# Patient Record
Sex: Male | Born: 1980 | Hispanic: Yes | Smoking: Former smoker
Health system: Southern US, Community
[De-identification: ages and names within clinical notes are randomized; demographics above are authoritative.]

## PROBLEM LIST (undated history)

## (undated) DIAGNOSIS — J45909 Unspecified asthma, uncomplicated: Secondary | ICD-10-CM

## (undated) DIAGNOSIS — T7840XA Allergy, unspecified, initial encounter: Secondary | ICD-10-CM

## (undated) DIAGNOSIS — F419 Anxiety disorder, unspecified: Secondary | ICD-10-CM

## (undated) DIAGNOSIS — F32A Depression, unspecified: Secondary | ICD-10-CM

## (undated) DIAGNOSIS — E119 Type 2 diabetes mellitus without complications: Secondary | ICD-10-CM

## (undated) HISTORY — PX: EYE SURGERY: SHX253

## (undated) HISTORY — DX: Anxiety disorder, unspecified: F41.9

## (undated) HISTORY — PX: OTHER SURGICAL HISTORY: SHX169

## (undated) HISTORY — DX: Depression, unspecified: F32.A

## (undated) HISTORY — DX: Allergy, unspecified, initial encounter: T78.40XA

---

## 2006-09-09 ENCOUNTER — Emergency Department: Payer: Self-pay | Admitting: Emergency Medicine

## 2007-06-05 ENCOUNTER — Emergency Department: Payer: Self-pay | Admitting: Internal Medicine

## 2008-01-08 ENCOUNTER — Emergency Department: Payer: Self-pay | Admitting: Emergency Medicine

## 2009-03-21 ENCOUNTER — Emergency Department: Payer: Self-pay | Admitting: Emergency Medicine

## 2012-08-12 ENCOUNTER — Emergency Department (HOSPITAL_COMMUNITY)
Admission: EM | Admit: 2012-08-12 | Discharge: 2012-08-12 | Disposition: A | Discharge status: Home / Self Care | Payer: BC Managed Care – PPO | Source: Home / Self Care

## 2012-08-12 ENCOUNTER — Encounter (HOSPITAL_COMMUNITY): Payer: Self-pay | Admitting: Emergency Medicine

## 2012-08-12 DIAGNOSIS — J309 Allergic rhinitis, unspecified: Secondary | ICD-10-CM

## 2012-08-12 DIAGNOSIS — IMO0001 Reserved for inherently not codable concepts without codable children: Secondary | ICD-10-CM

## 2012-08-12 HISTORY — DX: Unspecified asthma, uncomplicated: J45.909

## 2012-08-12 MED ORDER — TRIAMCINOLONE ACETONIDE 40 MG/ML IJ SUSP
40.0000 mg | Freq: Once | INTRAMUSCULAR | Status: AC
  Administered 2012-08-12: 40 mg via INTRAMUSCULAR

## 2012-08-12 MED ORDER — FLUTICASONE PROPIONATE 50 MCG/ACT NA SUSP: 2.0000 | Freq: Every day | NASAL | Status: DC

## 2012-08-12 MED ORDER — METHYLPREDNISOLONE 4 MG PO KIT: PACK | ORAL | Status: DC

## 2012-08-12 MED ORDER — BECLOMETHASONE DIPROPIONATE 80 MCG/ACT IN AERS: INHALATION_SPRAY | RESPIRATORY_TRACT | Status: DC

## 2012-08-12 MED ORDER — TRIAMCINOLONE ACETONIDE 40 MG/ML IJ SUSP
INTRAMUSCULAR | Status: AC
  Filled 2012-08-12: qty 5

## 2012-08-12 NOTE — ED Notes (Signed)
Reports allergy/asthma symptoms for 2 weeks. No improvement and asthma is worsening, increase usage of inhaler.  Patient planning a trip out of the country this Saturday.

## 2012-08-12 NOTE — ED Provider Notes (Signed)
History     CSN: [id number]  Arrival date & time 08/12/12  1054   First MD Initiated Contact with Patient 08/12/12 1146      Chief Complaint  Patient presents with  . Asthma    (Consider location/radiation/quality/duration/timing/severity/associated sxs/prior treatment) HPI Comments: 32 year old male with a history of allergies and asthma presents with complaints of worsening of allergy symptoms and increased use of the inhaler. He is now using his albuterol inhaler approximately 3 times a day. Call or symptoms include PND, cough, sneeze, itchy and red eyes. He is taking Claritin without significant relief.   Past Medical History  Diagnosis Date  . Asthma     History reviewed. No pertinent past surgical history.  No family history on file.  History  Substance Use Topics  . Smoking status: Former Games developer  . Smokeless tobacco: Not on file  . Alcohol Use: Yes      Review of Systems  Constitutional: Negative.   HENT: Positive for rhinorrhea, sneezing and postnasal drip. Negative for hearing loss, ear pain, nosebleeds, congestion, sore throat, facial swelling, mouth sores and trouble swallowing.   Respiratory: Positive for cough, shortness of breath and wheezing.   Cardiovascular: Negative.   Gastrointestinal: Negative.   Musculoskeletal: Negative.   Skin: Negative.     Allergies  Review of patient's allergies indicates no known allergies.  Home Medications   Current Outpatient Rx  Name  Route  Sig  Dispense  Refill  . ALBUTEROL IN   Inhalation   Inhale into the lungs.         . Loratadine (CLARITIN PO)   Oral   Take by mouth.         . beclomethasone (QVAR) 80 MCG/ACT inhaler      One inhalation bid   1 Inhaler   1   . fluticasone (FLONASE) 50 MCG/ACT nasal spray   Nasal   Place 2 sprays into the nose daily.   1 g   1   . methylPREDNISolone (MEDROL DOSEPAK) 4 MG tablet      follow package directions   21 tablet   0     BP 137/92  Pulse  96  Temp(Src) 98 F (36.7 C) (Oral)  Resp 16  SpO2 100%  Physical Exam  Nursing note and vitals reviewed. Constitutional: He is oriented to person, place, and time. He appears well-developed and well-nourished. No distress.  HENT:  Bilateral TMs are normal Oropharynx with minor erythema and clear PND. No exudates.  Eyes: EOM are normal. Left eye exhibits no discharge.  Neck: Normal range of motion. Neck supple.  Cardiovascular: Normal rate, regular rhythm and normal heart sounds.   Pulmonary/Chest: Effort normal and breath sounds normal. No respiratory distress.  Rare wheeze with inspiration. No expiratory wheezes. Good air movement and chest expansion. No cough during the exam.  Musculoskeletal: Normal range of motion. He exhibits no edema.  Lymphadenopathy:    He has no cervical adenopathy.  Neurological: He is alert and oriented to person, place, and time.  Skin: Skin is warm and dry. No rash noted. Pallor: keller.  Psychiatric: He has a normal mood and affect.    ED Course  Procedures (including critical care time)  Labs Reviewed - No data to display No results found.   1. Allergic rhinitis due to allergen   2. Asthma exacerbation, allergic       MDM  May try 60 mg 3 times a day when necessary allergies or if insufficient may try  Chlor-Trimeton 4 mg 1/2-1 tablet every 4 hours when necessary, may cause drowsiness. Fluticasone nasal spray as directed And Qvar 80 mg one inhalation twice a day Medrol Dosepak Kenalog 40 mg IM Recommend calling the number given to obtain a local primary care physician. May return at any symptoms problems or worsening. He denies current shortness of breath or significant wheezing. The patient nor I believe that a nebulizer is necessary at this time.        Hayden Rasmussen, NP 08/12/12 1226  Hayden Rasmussen, NP 08/12/12 1227

## 2012-08-12 NOTE — ED Provider Notes (Signed)
Medical screening examination/treatment/procedure(s) were performed by non-physician practitioner and as supervising physician I was immediately available for consultation/collaboration.  Leslee Home, M.D.  Reuben Likes, MD 08/12/12 [phone]

## 2014-02-07 ENCOUNTER — Ambulatory Visit (INDEPENDENT_AMBULATORY_CARE_PROVIDER_SITE_OTHER): Payer: BC Managed Care – PPO | Admitting: Physician Assistant

## 2014-02-07 VITALS — BP 122/76 | HR 72 | Temp 98.7°F | Resp 20 | Ht 71.0 in | Wt 229.0 lb

## 2014-02-07 DIAGNOSIS — R070 Pain in throat: Secondary | ICD-10-CM

## 2014-02-07 DIAGNOSIS — R202 Paresthesia of skin: Secondary | ICD-10-CM

## 2014-02-07 DIAGNOSIS — Z833 Family history of diabetes mellitus: Secondary | ICD-10-CM

## 2014-02-07 DIAGNOSIS — R200 Anesthesia of skin: Secondary | ICD-10-CM

## 2014-02-07 DIAGNOSIS — J4520 Mild intermittent asthma, uncomplicated: Secondary | ICD-10-CM

## 2014-02-07 LAB — BASIC METABOLIC PANEL
BUN: 12 mg/dL (ref 6–23)
CALCIUM: 10 mg/dL (ref 8.4–10.5)
CO2: 27 mEq/L (ref 19–32)
CREATININE: 0.89 mg/dL (ref 0.50–1.35)
Chloride: 102 mEq/L (ref 96–112)
GLUCOSE: 100 mg/dL — AB (ref 70–99)
Potassium: 4.8 mEq/L (ref 3.5–5.3)
SODIUM: 137 meq/L (ref 135–145)

## 2014-02-07 LAB — GLUCOSE, POCT (MANUAL RESULT ENTRY): POC GLUCOSE: 92 mg/dL (ref 70–99)

## 2014-02-07 LAB — POCT RAPID STREP A (OFFICE): Rapid Strep A Screen: NEGATIVE

## 2014-02-07 MED ORDER — ALBUTEROL SULFATE HFA 108 (90 BASE) MCG/ACT IN AERS: 2.0000 | INHALATION_SPRAY | RESPIRATORY_TRACT | Status: DC | PRN
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
## 2014-02-07 NOTE — Progress Notes (Signed)
I was directly involved with the patient's care and agree with the physical, diagnosis and treatment plan.  

## 2014-02-09 LAB — CULTURE, GROUP A STREP: ORGANISM ID, BACTERIA: NORMAL

## 2014-02-20 NOTE — Progress Notes (Signed)
Tried to call patient to schedule an appointment but number was not in service at this time.

## 2014-02-24 ENCOUNTER — Telehealth: Payer: Self-pay | Admitting: Family Medicine

## 2014-02-24 NOTE — Telephone Encounter (Signed)
Mobile number out of service and home number does not have voice mail set up. Unable to leave message

## 2014-02-24 NOTE — Telephone Encounter (Signed)
-----   Message from Wallis BambergMario Mani, [place]-C sent at 02/07/2014 11:15 AM EDT ----- Please schedule Physical Exam at the Appointment Center

## 2014-03-06 ENCOUNTER — Telehealth: Payer: Self-pay

## 2014-03-06 NOTE — Telephone Encounter (Signed)
Attempted to contact patient to schedule appointment.  Patient unable to be reached- home number cannot receive messages and cell phone number is not in service. (3rd attempt)

## 2014-06-10 ENCOUNTER — Ambulatory Visit (INDEPENDENT_AMBULATORY_CARE_PROVIDER_SITE_OTHER): Payer: Self-pay | Admitting: Urgent Care

## 2014-06-10 VITALS — BP 144/88 | HR 82 | Temp 97.6°F | Resp 16 | Ht 70.0 in | Wt 228.0 lb

## 2014-06-10 DIAGNOSIS — R110 Nausea: Secondary | ICD-10-CM

## 2014-06-10 DIAGNOSIS — R42 Dizziness and giddiness: Secondary | ICD-10-CM

## 2014-06-10 DIAGNOSIS — K0889 Other specified disorders of teeth and supporting structures: Secondary | ICD-10-CM

## 2014-06-10 DIAGNOSIS — K088 Other specified disorders of teeth and supporting structures: Secondary | ICD-10-CM

## 2014-06-10 MED ORDER — LIDOCAINE VISCOUS 2 % MT SOLN: 20.0000 mL | OROMUCOSAL | Status: DC | PRN

## 2014-06-10 NOTE — Patient Instructions (Addendum)
For dental pain, please use viscous lidocaine every 6 hours. You may continue using Vicofen (Hydrocodone with ibuprofen) every 6 hours for your pain. Please make sure you make your appointment with your oral surgeon as this will be the definitive treatment for your tooth pain.

## 2014-06-10 NOTE — Progress Notes (Signed)
    MRN: [id number] DOB: 1980-10-11  Subjective:   Alexander GermanRafael Miro Jr. is a 34 y.o. male presenting for chief complaint of Drug Overdose  Reports that he was evaluated by his dentist this week for right sided tooth pain, dx with molar decay and advised surgical removal. Patient was started on Hydrocodone-APAP and also received Hydrocodone-IBU. He started taking Hydrocodone-APAP x3, Tylenol 500mg  x3 on Thursday. Patient reports minimal relief with this. Friday, he took Hydrocodone-IBU x3, ibuprofen 200mg  x8 throughout the day, with some relief. However, now patient reports dizziness, nausea, stomach upset, generally feels "different" and is worried that he overdosed on medications. He states that he has not taken anything for pain today, has some discomfort but not as bad as earlier this week. He denies hypoventilation, pinpoint pupils, confusion, shob, rhinorrhea, lacrimation, chills, depressed mood, suicidal ideation. Denies any other aggravating or relieving factors, no other questions or concerns.  Alexander Osborne has a current medication list which includes the following prescription(s): albuterol, albuterol, fluticasone, hydrocodone-ibuprofen, loratadine, beclomethasone, and hydrocodone-acetaminophen.  He has No Known Allergies.  Alexander Osborne  has a past medical history of Asthma and Allergy. Also  has past surgical history that includes Eye surgery.  ROS As in subjective.  Objective:   Vitals: BP 144/88 mmHg  Pulse 82  Temp(Src) 97.6 F (36.4 C) (Oral)  Resp 16  Ht 5\' 10"  (1.778 m)  Wt 228 lb (103.42 kg)  BMI 32.71 kg/m2  SpO2 100%  Physical Exam  Constitutional: He is oriented to person, place, and time and well-developed, well-nourished, and in no distress.  HENT:  TM's intact bilaterally, no effusions or erythema. Nares patent, minimal rhinorrhea. Gums non-erythematous, no edema, bleeding or drainage. Right lower molars without obvious signs of infection. Mucous membranes moist.  Eyes:  Conjunctivae are normal. Right eye exhibits no discharge. Left eye exhibits no discharge. No scleral icterus.  No pinpoint pupils, no lacrimation.  Cardiovascular: Normal rate, regular rhythm, normal heart sounds and intact distal pulses.  Exam reveals no gallop and no friction rub.   No murmur heard. Pulmonary/Chest: Effort normal and breath sounds normal. No respiratory distress. He has no wheezes. He has no rales. He exhibits no tenderness.  Abdominal: Soft. Bowel sounds are normal. He exhibits no distension and no mass. There is no tenderness.  Neurological: He is alert and oriented to person, place, and time.  Skin: Skin is warm and dry. No rash noted. No erythema.  Psychiatric: Mood normal.  Patient is giddy throughout HPI.   Assessment and Plan :   1. Pain, dental 2. Dizziness and giddiness 3. Nausea without vomiting - Reassured patient, advised that he stop use Hydrocodone-IBU since this helped most, emphasized that patient should not use more than prescribed. - For local pain relief, patient may use viscous lidocaine - Advised patient to make his appointment with oral surgeon which is scheduled for Monday, 06/12/2014   Wallis BambergMario Candice Lunney, PA-C [facility] [phone] 06/10/2014 2:35 PM

## 2015-04-17 ENCOUNTER — Ambulatory Visit (INDEPENDENT_AMBULATORY_CARE_PROVIDER_SITE_OTHER): Payer: Self-pay | Admitting: Emergency Medicine

## 2015-04-17 VITALS — BP 130/80 | HR 109 | Temp 98.8°F | Resp 20 | Wt 243.0 lb

## 2015-04-17 DIAGNOSIS — J209 Acute bronchitis, unspecified: Secondary | ICD-10-CM

## 2015-04-17 DIAGNOSIS — J0140 Acute pansinusitis, unspecified: Secondary | ICD-10-CM

## 2015-04-17 MED ORDER — AMOXICILLIN-POT CLAVULANATE 875-125 MG PO TABS: 1.0000 | ORAL_TABLET | Freq: Two times a day (BID) | ORAL | Status: DC

## 2015-04-17 MED ORDER — PSEUDOEPHEDRINE-GUAIFENESIN ER 60-600 MG PO TB12: 1.0000 | ORAL_TABLET | Freq: Two times a day (BID) | ORAL | Status: DC

## 2015-04-17 MED ORDER — HYDROCOD POLST-CPM POLST ER 10-8 MG/5ML PO SUER: 5.0000 mL | Freq: Two times a day (BID) | ORAL | Status: DC

## 2015-04-17 NOTE — Progress Notes (Signed)
Subjective:  Patient ID: Alexander Osborne, male    DOB: December 19, 1980  Age: 34 y.o. MRN: [id number]  CC: Cough; Headache; Fever; Sore Throat; and Wheezing   HPI Alexander Osborne presents   Patient is been experiencing fever and chills.  He has been ill since before Christmas. He has nasal congestion postnasal drainage of purulent nasal discharge. He has a sore throat. Has a cough productive of purulent sputum. He has no shortness of breath. He does wheeze at night. He has no nausea vomiting or stool change. He has had no improvement with over-the-counter medication been using his inhaler no improvement  History Alexander Osborne has a past medical history of Asthma and Allergy.   He has past surgical history that includes Eye surgery.   His  family history includes Cancer in his mother; Hypertension in his father.  He   reports that he has quit smoking. He does not have any smokeless tobacco history on file. He reports that he drinks alcohol. He reports that he does not use illicit drugs.  Outpatient Prescriptions Prior to Visit  Medication Sig Dispense Refill  . albuterol (PROVENTIL HFA;VENTOLIN HFA) 108 (90 BASE) MCG/ACT inhaler Inhale 2 puffs into the lungs every 4 (four) hours as needed for wheezing or shortness of breath (cough, shortness of breath or wheezing.). 1 Inhaler 1  . beclomethasone (QVAR) 80 MCG/ACT inhaler One inhalation bid 1 Inhaler 1  . Loratadine (CLARITIN PO) Take by mouth.    . ALBUTEROL IN Inhale into the lungs. Reported on 04/17/2015    . fluticasone (FLONASE) 50 MCG/ACT nasal spray Place 2 sprays into the nose daily. (Patient not taking: Reported on 04/17/2015) 1 g 1  . lidocaine (XYLOCAINE) 2 % solution Use as directed 20 mLs in the mouth or throat as needed for mouth pain. (Patient not taking: Reported on 04/17/2015) 100 mL 0   No facility-administered medications prior to visit.    Social History   Social History  . Marital Status: Single    Spouse Name:  N/A  . Number of Children: N/A  . Years of Education: N/A   Social History Main Topics  . Smoking status: Former Games developermoker  . Smokeless tobacco: None  . Alcohol Use: Yes  . Drug Use: No  . Sexual Activity: Not Asked   Other Topics Concern  . None   Social History Narrative     Review of Systems  Constitutional: Positive for fever, chills and fatigue. Negative for appetite change.  HENT: Positive for congestion, postnasal drip, rhinorrhea and sinus pressure. Negative for ear pain and sore throat.   Eyes: Negative for pain and redness.  Respiratory: Positive for cough and wheezing. Negative for shortness of breath.   Cardiovascular: Negative for leg swelling.  Gastrointestinal: Negative for nausea, vomiting, abdominal pain, diarrhea, constipation and blood in stool.  Endocrine: Negative for polyuria.  Genitourinary: Negative for dysuria, urgency, frequency and flank pain.  Musculoskeletal: Negative for gait problem.  Skin: Negative for rash.  Neurological: Negative for weakness and headaches.  Psychiatric/Behavioral: Negative for confusion and decreased concentration. The patient is not nervous/anxious.     Objective:  BP 130/80 mmHg  Pulse 109  Temp(Src) 98.8 F (37.1 C) (Oral)  Resp 20  Wt 243 lb (110.224 kg)  SpO2 97%  Physical Exam  Constitutional: He is oriented to person, place, and time. He appears well-developed and well-nourished.  HENT:  Head: Normocephalic and atraumatic.  Right Ear: External ear normal.  Left Ear: External ear normal.  Mouth/Throat: Oropharynx is clear and moist. No oropharyngeal exudate.  Eyes: Conjunctivae are normal. Pupils are equal, round, and reactive to light.  Neck: Normal range of motion.  Pulmonary/Chest: Effort normal. No respiratory distress. He has no wheezes. He has no rales.  Musculoskeletal: He exhibits no edema.  Neurological: He is alert and oriented to person, place, and time.  Skin: Skin is dry.  Psychiatric: He has a  normal mood and affect. His behavior is normal. Thought content normal.      Assessment & Plan:   Alexander Osborne was seen today for cough, headache, fever, sore throat and wheezing.  Diagnoses and all orders for this visit:  Acute bronchitis, unspecified organism  Acute pansinusitis, recurrence not specified  Other orders -     amoxicillin-clavulanate (AUGMENTIN) 875-125 MG tablet; Take 1 tablet by mouth 2 (two) times daily. -     chlorpheniramine-HYDROcodone (TUSSIONEX PENNKINETIC ER) 10-8 MG/5ML SUER; Take 5 mLs by mouth 2 (two) times daily. -     pseudoephedrine-guaifenesin (MUCINEX D) 60-600 MG 12 hr tablet; Take 1 tablet by mouth every 12 (twelve) hours.  I am having Mr. [facility] start on amoxicillin-clavulanate, chlorpheniramine-HYDROcodone, and pseudoephedrine-guaifenesin. I am also having him maintain his Loratadine (CLARITIN PO), ALBUTEROL IN, fluticasone, beclomethasone, albuterol, and lidocaine.  Meds ordered this encounter  Medications  . amoxicillin-clavulanate (AUGMENTIN) 875-125 MG tablet    Sig: Take 1 tablet by mouth 2 (two) times daily.    Dispense:  20 tablet    Refill:  0  . chlorpheniramine-HYDROcodone (TUSSIONEX PENNKINETIC ER) 10-8 MG/5ML SUER    Sig: Take 5 mLs by mouth 2 (two) times daily.    Dispense:  60 mL    Refill:  0  . pseudoephedrine-guaifenesin (MUCINEX D) 60-600 MG 12 hr tablet    Sig: Take 1 tablet by mouth every 12 (twelve) hours.    Dispense:  18 tablet    Refill:  0    Appropriate red flag conditions were discussed with the patient as well as actions that should be taken.  Patient expressed his understanding.  Follow-up: Return if symptoms worsen or fail to improve.  Carmelina Dane, MD

## 2015-04-17 NOTE — Patient Instructions (Signed)

## 2015-05-06 ENCOUNTER — Ambulatory Visit (INDEPENDENT_AMBULATORY_CARE_PROVIDER_SITE_OTHER): Payer: Managed Care, Other (non HMO) | Admitting: Emergency Medicine

## 2015-05-06 VITALS — BP 126/80 | HR 92 | Temp 98.0°F | Resp 20 | Ht 70.0 in | Wt 241.4 lb

## 2015-05-06 DIAGNOSIS — J4520 Mild intermittent asthma, uncomplicated: Secondary | ICD-10-CM | POA: Diagnosis not present

## 2015-05-06 DIAGNOSIS — J209 Acute bronchitis, unspecified: Secondary | ICD-10-CM

## 2015-05-06 MED ORDER — ALBUTEROL SULFATE HFA 108 (90 BASE) MCG/ACT IN AERS: 2.0000 | INHALATION_SPRAY | RESPIRATORY_TRACT | Status: DC | PRN

## 2015-05-06 MED ORDER — AZITHROMYCIN 250 MG PO TABS: ORAL_TABLET | ORAL | Status: DC

## 2015-05-06 MED ORDER — HYDROCOD POLST-CPM POLST ER 10-8 MG/5ML PO SUER: 5.0000 mL | Freq: Two times a day (BID) | ORAL | Status: DC

## 2015-05-06 NOTE — Patient Instructions (Signed)

## 2015-05-06 NOTE — Progress Notes (Signed)
Subjective:  Patient ID: Alexander Osborne, male    DOB: 1980-04-25  Age: 35 y.o. MRN: [id number]  CC: Cough and Emesis   HPI Alexander Osborne presents  patient was treated in December for bronchitis and sinusitis he transiently improved and now has developed another cough. He has no fever chills nausea vomiting. He has a persistent cough with no wheezing or shortness of breath. He's been unable to control her symptoms with over-the-counter medication. He coughs to the point of vomiting at times. Has no nasal congestion postnasal drainage. No improvement with over-the-counter medication.  [place] has a past medical history of Asthma and Allergy.   He has past surgical history that includes Eye surgery.   His  family history includes Cancer in his mother; Hypertension in his father.  He   reports that he has quit smoking. He does not have any smokeless tobacco history on file. He reports that he drinks alcohol. He reports that he does not use illicit drugs.  Outpatient Prescriptions Prior to Visit  Medication Sig Dispense Refill  . ALBUTEROL IN Inhale into the lungs. Reported on 04/17/2015    . Loratadine (CLARITIN PO) Take by mouth.    . pseudoephedrine-guaifenesin (MUCINEX D) 60-600 MG 12 hr tablet Take 1 tablet by mouth every 12 (twelve) hours. 18 tablet 0  . albuterol (PROVENTIL HFA;VENTOLIN HFA) 108 (90 BASE) MCG/ACT inhaler Inhale 2 puffs into the lungs every 4 (four) hours as needed for wheezing or shortness of breath (cough, shortness of breath or wheezing.). 1 Inhaler 1  . amoxicillin-clavulanate (AUGMENTIN) 875-125 MG tablet Take 1 tablet by mouth 2 (two) times daily. (Patient not taking: Reported on 05/06/2015) 20 tablet 0  . beclomethasone (QVAR) 80 MCG/ACT inhaler One inhalation bid (Patient not taking: Reported on 05/06/2015) 1 Inhaler 1  . chlorpheniramine-HYDROcodone (TUSSIONEX PENNKINETIC ER) 10-8 MG/5ML SUER Take 5 mLs by mouth 2 (two) times daily. (Patient  not taking: Reported on 05/06/2015) 60 mL 0  . fluticasone (FLONASE) 50 MCG/ACT nasal spray Place 2 sprays into the nose daily. (Patient not taking: Reported on 04/17/2015) 1 g 1  . lidocaine (XYLOCAINE) 2 % solution Use as directed 20 mLs in the mouth or throat as needed for mouth pain. (Patient not taking: Reported on 04/17/2015) 100 mL 0   No facility-administered medications prior to visit.    Social History   Social History  . Marital Status: Single    Spouse Name: N/A  . Number of Children: N/A  . Years of Education: N/A   Social History Main Topics  . Smoking status: Former Games developermoker  . Smokeless tobacco: None  . Alcohol Use: Yes  . Drug Use: No  . Sexual Activity: Not Asked   Other Topics Concern  . None   Social History Narrative     Review of Systems  Constitutional: Negative for fever, chills and appetite change.  HENT: Negative for congestion, ear pain, postnasal drip, sinus pressure and sore throat.   Eyes: Negative for pain and redness.  Respiratory: Positive for cough. Negative for shortness of breath and wheezing.   Cardiovascular: Negative for leg swelling.  Gastrointestinal: Positive for vomiting. Negative for nausea, abdominal pain, diarrhea, constipation and blood in stool.  Endocrine: Negative for polyuria.  Genitourinary: Negative for dysuria, urgency, frequency and flank pain.  Musculoskeletal: Negative for gait problem.  Skin: Negative for rash.  Neurological: Negative for weakness and headaches.  Psychiatric/Behavioral: Negative for confusion and decreased concentration. The patient is not nervous/anxious.  Objective:  BP 126/80 mmHg  Pulse 92  Temp(Src) 98 F (36.7 C) (Oral)  Resp 20  Ht 5\' 10"  (1.778 m)  Wt 241 lb 6.4 oz (109.498 kg)  BMI 34.64 kg/m2  SpO2 98%  Physical Exam  Constitutional: He is oriented to person, place, and time. He appears well-developed and well-nourished.  HENT:  Head: Normocephalic and atraumatic.  Eyes:  Conjunctivae are normal. Pupils are equal, round, and reactive to light.  Pulmonary/Chest: Effort normal.  Musculoskeletal: He exhibits no edema.  Neurological: He is alert and oriented to person, place, and time.  Skin: Skin is dry.  Psychiatric: He has a normal mood and affect. His behavior is normal. Thought content normal.      Assessment & Plan:   Branon was seen today for cough and emesis.  Diagnoses and all orders for this visit:  Acute bronchitis, unspecified organism  Extrinsic asthma, mild intermittent, uncomplicated -     albuterol (PROVENTIL HFA;VENTOLIN HFA) 108 (90 Base) MCG/ACT inhaler; Inhale 2 puffs into the lungs every 4 (four) hours as needed for wheezing or shortness of breath (cough, shortness of breath or wheezing.).  Other orders -     azithromycin (ZITHROMAX) 250 MG tablet; Take 2 tabs PO x 1 dose, then 1 tab PO QD x 4 days -     chlorpheniramine-HYDROcodone (TUSSIONEX PENNKINETIC ER) 10-8 MG/5ML SUER; Take 5 mLs by mouth 2 (two) times daily.   I am having Mr. Doree Fudge start on azithromycin and chlorpheniramine-HYDROcodone. I am also having him maintain his Loratadine (CLARITIN PO), ALBUTEROL IN, fluticasone, beclomethasone, lidocaine, amoxicillin-clavulanate, chlorpheniramine-HYDROcodone, pseudoephedrine-guaifenesin, and albuterol.  Meds ordered this encounter  Medications  . azithromycin (ZITHROMAX) 250 MG tablet    Sig: Take 2 tabs PO x 1 dose, then 1 tab PO QD x 4 days    Dispense:  6 tablet    Refill:  0  . chlorpheniramine-HYDROcodone (TUSSIONEX PENNKINETIC ER) 10-8 MG/5ML SUER    Sig: Take 5 mLs by mouth 2 (two) times daily.    Dispense:  60 mL    Refill:  0  . albuterol (PROVENTIL HFA;VENTOLIN HFA) 108 (90 Base) MCG/ACT inhaler    Sig: Inhale 2 puffs into the lungs every 4 (four) hours as needed for wheezing or shortness of breath (cough, shortness of breath or wheezing.).    Dispense:  1 Inhaler    Refill:  12    Appropriate red flag  conditions were discussed with the patient as well as actions that should be taken.  Patient expressed his understanding.  Follow-up: Return if symptoms worsen or fail to improve.  Carmelina Dane, MD

## 2015-06-05 ENCOUNTER — Ambulatory Visit (INDEPENDENT_AMBULATORY_CARE_PROVIDER_SITE_OTHER): Payer: Managed Care, Other (non HMO) | Admitting: Family Medicine

## 2015-06-05 ENCOUNTER — Ambulatory Visit (INDEPENDENT_AMBULATORY_CARE_PROVIDER_SITE_OTHER): Payer: Managed Care, Other (non HMO)

## 2015-06-05 VITALS — BP 124/86 | HR 95 | Temp 98.6°F | Resp 16 | Ht 70.0 in | Wt 241.0 lb

## 2015-06-05 DIAGNOSIS — R1032 Left lower quadrant pain: Secondary | ICD-10-CM

## 2015-06-05 NOTE — Patient Instructions (Addendum)
We are running a series of tests to investigate whether the bowel problem is inflammatory, infectious, or functional (irritable bowel syndrome).  I think it's important to reestablish the normal biome of your bowel (getting normal bacteria again). To that and you should take Align daily.   Because you received an x-ray today, you will receive an invoice from [facility]. Please contact [facility] at [phone] with questions or concerns regarding your invoice. Our billing staff will not be able to assist you with those questions.

## 2015-06-05 NOTE — Progress Notes (Signed)
By signing my name below, I, [name], attest that this documentation has been prepared under the direction and in the presence of [name], MD. Electronically Signed: [name], Scribe. 06/05/2015 , 6:11 PM .  Patient was seen in room 12 .   Patient ID: Alexander Osborne MRN: [id number], DOB: 11/02/1980, 35 y.o. Date of Encounter: 06/05/2015  Primary Physician: No PCP Per Patient  Chief Complaint:  Chief Complaint  Patient presents with  . Abdominal Pain    ongoing for a couple of years, getting worse recently  . Diarrhea    HPI:  Alexander Osborne is a 35 y.o. male who presents to [facility] complaining of sharp lower abdominal pain that's been ongoing for a couple years and has gotten worse recently. He mentions that he's been sick towards the end of December and beginning of January and was taking antibiotics. He had diarrhea going on for 2 weeks about 3 weeks ago. Every time he had a BM, he felt the pain, but would feel better after the movement. He informed having some episodes of vomiting. This would sometimes wake him up at night.   He's gained about 10 lbs after changing jobs. 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  He recently just started work at [employer].   Past Medical History  Diagnosis Date  . Asthma   . Allergy      Home Meds: Prior to Admission medications   Medication Sig Start Date End Date Taking? Authorizing Provider  albuterol (PROVENTIL HFA;VENTOLIN HFA) 108 (90 Base) MCG/ACT inhaler Inhale 2 puffs into the lungs every 4 (four) hours as needed for wheezing or shortness of breath (cough, shortness of breath or wheezing.). 05/06/15  Yes Carmelina Dane, MD  ALBUTEROL IN Inhale into the lungs. Reported on 04/17/2015   Yes Historical Provider, MD  Loratadine (CLARITIN PO) Take by mouth.   Yes Historical Provider, MD  RaNITidine HCl (ZANTAC PO) Take by mouth.   Yes Historical Provider, MD  azithromycin  (ZITHROMAX) 250 MG tablet Take 2 tabs PO x 1 dose, then 1 tab PO QD x 4 days Patient not taking: Reported on 06/05/2015 05/06/15   Carmelina Dane, MD  beclomethasone (QVAR) 80 MCG/ACT inhaler One inhalation bid Patient not taking: Reported on 05/06/2015 08/12/12   Hayden Rasmussen, NP  chlorpheniramine-HYDROcodone [facility] PENNKINETIC ER) 10-8 MG/5ML SUER Take 5 mLs by mouth 2 (two) times daily. Patient not taking: Reported on 05/06/2015 04/17/15   Carmelina Dane, MD  chlorpheniramine-HYDROcodone [facility] ER) 10-8 MG/5ML SUER Take 5 mLs by mouth 2 (two) times daily. Patient not taking: Reported on 06/05/2015 05/06/15   Carmelina Dane, MD  fluticasone [facility]) 50 MCG/ACT nasal spray Place 2 sprays into the nose daily. Patient not taking: Reported on 04/17/2015 08/12/12   Hayden Rasmussen, NP  lidocaine (XYLOCAINE) 2 % solution Use as directed 20 mLs in the mouth or throat as needed for mouth pain. Patient not taking: Reported on 04/17/2015 06/10/14   Wallis Bamberg, PA-C  pseudoephedrine-guaifenesin [facility] D) 60-600 MG 12 hr tablet Take 1 tablet by mouth every 12 (twelve) hours. Patient not taking: Reported on 06/05/2015 04/17/15 04/16/16  Carmelina Dane, MD    Allergies: No Known Allergies  Social History   Social History  . Marital Status: Single    Spouse Name: N/A  . Number of Children: N/A  . Years of Education: N/A   Occupational History  . Not on  file.   Social History Main Topics  . Smoking status: Former Games developer  . Smokeless tobacco: Not on file  . Alcohol Use: Yes  . Drug Use: No  . Sexual Activity: Not on file   Other Topics Concern  . Not on file   Social History Narrative     Review of Systems: Constitutional: negative for fever, chills, night sweats, weight changes, or fatigue  HEENT: negative for vision changes, hearing loss, congestion, rhinorrhea, ST, epistaxis, or sinus pressure Cardiovascular: negative for chest pain or palpitations Respiratory:  negative for hemoptysis, wheezing, shortness of breath, or cough Abdominal: negative for nausea, vomiting, or constipation; positive for abdominal pain, diarrhea Dermatological: negative for rash Neurologic: negative for headache, dizziness, or syncope All other systems reviewed and are otherwise negative with the exception to those above and in the HPI.  Physical Exam: Blood pressure 124/86, pulse 95, temperature 98.6 F (37 C), resp. rate 16, height  (1.778 m), weight 241 lb (109.317 kg), SpO2 98 %., Body mass index is 34.58 kg/(m^2). General: Well developed, well nourished, in no acute distress. Head: Normocephalic, atraumatic, eyes without discharge, sclera non-icteric, nares are without discharge. Bilateral auditory canals clear, TM's are without perforation, pearly grey and translucent with reflective cone of light bilaterally. Oral cavity moist, posterior pharynx without exudate, erythema, peritonsillar abscess, or post nasal drip.  Neck: Supple. No thyromegaly. Full ROM. No lymphadenopathy. Lungs: Clear bilaterally to auscultation without wheezes, rales, or rhonchi. Breathing is unlabored. Heart: RRR with S1 S2. No murmurs, rubs, or gallops appreciated. Abdomen: Soft, non-tender, non-distended with hyperactive bowel sounds. No hepatomegaly. No rebound/guarding. No obvious abdominal masses.  Msk:  Strength and tone normal for age. Extremities/Skin: Warm and dry. No clubbing or cyanosis. No edema. No rashes or suspicious lesions. Neuro: Alert and oriented X 3. Moves all extremities spontaneously. Gait is normal. CNII-XII grossly in tact. Psych:  Responds to questions appropriately with a normal affect.   Labs:  [facility] reading (PRIMARY) by Dr. Milus Glazier : pelvic xray: moderate amount of stool in the ascending colon, no ectopic calcification seen to explain lower abdominal pain   ASSESSMENT AND PLAN:  35 y.o. year old male with  This chart was scribed in my presence and reviewed  by me personally.    ICD-9-CM ICD-10-CM   1. Abdominal pain, left lower quadrant 789.04 R10.32 DG Abd 1 View     Sedimentation rate     CBC with Differential/Platelet     H. pylori breath test     CANCELED: POCT CBC     CANCELED: POCT SEDIMENTATION RATE     CANCELED: H. pylori breath test     CANCELED: H. pylori breath test     Signed, [name], MD     Signed, [name], MD 06/05/2015 6:11 PM

## 2015-06-07 LAB — CBC WITH DIFFERENTIAL/PLATELET
Basophils Absolute: 0 10*3/uL (ref 0.0–0.2)
Basos: 0 %
EOS (ABSOLUTE): 0.1 10*3/uL (ref 0.0–0.4)
Eos: 2 %
Hematocrit: 44.8 % (ref 37.5–51.0)
Hemoglobin: 15.7 g/dL (ref 12.6–17.7)
Immature Grans (Abs): 0 10*3/uL (ref 0.0–0.1)
Immature Granulocytes: 0 %
Lymphocytes Absolute: 2.6 10*3/uL (ref 0.7–3.1)
Lymphs: 31 %
MCH: 31.3 pg (ref 26.6–33.0)
MCHC: 35 g/dL (ref 31.5–35.7)
MCV: 89 fL (ref 79–97)
Monocytes Absolute: 0.6 10*3/uL (ref 0.1–0.9)
Monocytes: 8 %
Neutrophils Absolute: 4.9 10*3/uL (ref 1.4–7.0)
Neutrophils: 59 %
Platelets: 236 10*3/uL (ref 150–379)
RBC: 5.01 x10E6/uL (ref 4.14–5.80)
RDW: 14.2 % (ref 12.3–15.4)
WBC: 8.2 10*3/uL (ref 3.4–10.8)

## 2015-06-07 LAB — SPECIMEN STATUS REPORT

## 2015-06-07 LAB — SEDIMENTATION RATE

## 2015-06-08 LAB — CBC WITH DIFFERENTIAL/PLATELET

## 2015-06-08 LAB — SPECIMEN STATUS REPORT

## 2015-06-09 LAB — H. PYLORI BREATH TEST: H. pylori UBiT: NEGATIVE

## 2015-06-10 ENCOUNTER — Telehealth: Payer: Self-pay | Admitting: *Deleted

## 2015-06-10 NOTE — Telephone Encounter (Signed)
Pt called back about labs and notified that they were all normal.  He states that he is a little better off and on.

## 2015-09-15 ENCOUNTER — Ambulatory Visit (INDEPENDENT_AMBULATORY_CARE_PROVIDER_SITE_OTHER): Payer: Managed Care, Other (non HMO) | Admitting: Physician Assistant

## 2015-09-15 ENCOUNTER — Ambulatory Visit (INDEPENDENT_AMBULATORY_CARE_PROVIDER_SITE_OTHER): Payer: Managed Care, Other (non HMO)

## 2015-09-15 VITALS — BP 110/80 | HR 96 | Temp 98.2°F | Resp 18 | Ht 70.0 in | Wt 237.4 lb

## 2015-09-15 DIAGNOSIS — R05 Cough: Secondary | ICD-10-CM

## 2015-09-15 DIAGNOSIS — R062 Wheezing: Secondary | ICD-10-CM

## 2015-09-15 DIAGNOSIS — R058 Other specified cough: Secondary | ICD-10-CM

## 2015-09-15 MED ORDER — HYDROCOD POLST-CPM POLST ER 10-8 MG/5ML PO SUER: 5.0000 mL | Freq: Every evening | ORAL | Status: DC | PRN

## 2015-09-15 MED ORDER — GUAIFENESIN ER 1200 MG PO TB12: 1.0000 | ORAL_TABLET | Freq: Two times a day (BID) | ORAL | Status: DC | PRN

## 2015-09-15 MED ORDER — IPRATROPIUM BROMIDE 0.03 % NA SOLN: 2.0000 | Freq: Two times a day (BID) | NASAL | Status: DC

## 2015-09-15 MED ORDER — BENZONATATE 100 MG PO CAPS: 100.0000 mg | ORAL_CAPSULE | Freq: Three times a day (TID) | ORAL | Status: DC | PRN

## 2015-09-15 NOTE — Progress Notes (Signed)
[facility] [address], [place] [id number] [phone]  Date:  09/15/2015   Name:  Alexander Osborne   DOB:  04/08/1981   MRN:  [id number]  PCP:  No PCP Per Patient    History of Present Illness:  Alexander Osborne is a 35 y.o. male patient who [facility] of cough and congestion. Patient reports cough that started acutely last night.   He also experienced some throat discomfort.  He had productive mucus and head pain.  He felt a bit disoriented with all the coughing but this dissipated.  He stated that his throat felt funny.  He has had clear mucus with coughing, but in the morning was green sputum.  He then coughed up red sputum.  This has since only happened in one episode.  He had some nausea secondary to the coughing.  He has some headache.  He has coughed to the point where is eyes became red.  Feels like he is coughing after these tiny burps. He has no fever.  He attempted to use his inhaler last night, which helped minimally.  He has no sob or dyspnea with activity.  None with lying down, only with the coughing.       Patient Active Problem List   Diagnosis Date Noted  . Extrinsic asthma 02/07/2014    Past Medical History  Diagnosis Date  . Asthma   . Allergy     Past Surgical History  Procedure Laterality Date  . Eye surgery      Social History  Substance Use Topics  . Smoking status: Former Games developermoker  . Smokeless tobacco: None  . Alcohol Use: Yes    Family History  Problem Relation Age of Onset  . Cancer Mother   . Hypertension Father     No Known Allergies  Medication list has been reviewed and updated.  Current Outpatient Prescriptions on File Prior to Visit  Medication Sig Dispense Refill  . albuterol (PROVENTIL HFA;VENTOLIN HFA) 108 (90 Base) MCG/ACT inhaler Inhale 2 puffs into the lungs every 4 (four) hours as needed for wheezing or shortness of breath (cough, shortness of breath or wheezing.). 1 Inhaler 12  .  ALBUTEROL IN Inhale into the lungs. Reported on 09/15/2015    . azithromycin (ZITHROMAX) 250 MG tablet Take 2 tabs PO x 1 dose, then 1 tab PO QD x 4 days (Patient not taking: Reported on 06/05/2015) 6 tablet 0  . beclomethasone (QVAR) 80 MCG/ACT inhaler One inhalation bid (Patient not taking: Reported on 05/06/2015) 1 Inhaler 1  . chlorpheniramine-HYDROcodone (TUSSIONEX PENNKINETIC ER) 10-8 MG/5ML SUER Take 5 mLs by mouth 2 (two) times daily. (Patient not taking: Reported on 05/06/2015) 60 mL 0  . chlorpheniramine-HYDROcodone (TUSSIONEX PENNKINETIC ER) 10-8 MG/5ML SUER Take 5 mLs by mouth 2 (two) times daily. (Patient not taking: Reported on 06/05/2015) 60 mL 0  . fluticasone (FLONASE) 50 MCG/ACT nasal spray Place 2 sprays into the nose daily. (Patient not taking: Reported on 04/17/2015) 1 g 1  . lidocaine (XYLOCAINE) 2 % solution Use as directed 20 mLs in the mouth or throat as needed for mouth pain. (Patient not taking: Reported on 04/17/2015) 100 mL 0  . Loratadine (CLARITIN PO) Take by mouth. Reported on 09/15/2015    . pseudoephedrine-guaifenesin (MUCINEX D) 60-600 MG 12 hr tablet Take 1 tablet by mouth every 12 (twelve) hours. (Patient not taking: Reported on 06/05/2015) 18 tablet 0  . RaNITidine HCl (ZANTAC PO)  Take by mouth. Reported on 09/15/2015     No current facility-administered medications on file prior to visit.    ROS ROS otherwise unremarkable unless listed above.   Physical Examination: BP 110/80 mmHg  Pulse 96  Temp(Src) 98.2 F (36.8 C) (Oral)  Resp 18  Ht  (1.778 m)  Wt 237 lb 6 oz (107.673 kg)  BMI 34.06 kg/m2  SpO2 96% Ideal Body Weight: Weight in (lb) to have BMI = 25: 173.9  Physical Exam  Constitutional: He is oriented to person, place, and time. He appears well-developed and well-nourished. No distress.  HENT:  Head: Normocephalic and atraumatic.  Eyes: Conjunctivae and EOM are normal. Pupils are equal, round, and reactive to light.  Cardiovascular: Normal  rate.   Pulmonary/Chest: Effort normal. No respiratory distress.  Neurological: He is alert and oriented to person, place, and time.  Skin: Skin is warm and dry. He is not diaphoretic.  Psychiatric: He has a normal mood and affect. His behavior is normal.  Dg Chest 2 View  09/15/2015  CLINICAL DATA:  Productive cough. EXAM: CHEST  2 VIEW COMPARISON:  None. FINDINGS: Normal sized heart.  Clear lungs.  Normal appearing bones. IMPRESSION: Normal examination. Electronically Signed   By: Beckie Salts M.D.   On: 09/15/2015 12:33    Assessment and Plan: Alexander Osborne is a 35 y.o. male who is here today for coughing.  -though cough is severe, it appears to be viral at this time.  I have advised supportive treatment.  Also advised to perform albuterol inhaler tx when 4 times per day.  Productive cough - Plan: DG Chest 2 View, benzonatate (TESSALON) 100 MG capsule, chlorpheniramine-HYDROcodone (TUSSIONEX PENNKINETIC ER) 10-8 MG/5ML SUER, Guaifenesin (MUCINEX MAXIMUM STRENGTH) 1200 MG TB12, ipratropium (ATROVENT) 0.03 % nasal spray  Wheezing  Trena Platt, PA-C [facility] 09/15/2015 11:42 AM

## 2015-09-15 NOTE — Patient Instructions (Addendum)
     IF you received an x-ray today, you will receive an invoice from [facility]. Please contact [facility] at [phone] with questions or concerns regarding your invoice.   IF you received labwork today, you will receive an invoice from [facility]. Please contact [facility] at [phone] with questions or concerns regarding your invoice.   Our billing staff will not be able to assist you with questions regarding bills from these companies.  You will be contacted with the lab results as soon as they are available. The fastest way to get your results is to activate your My Chart account. Instructions are located on the last page of this paperwork. If you have not heard from us regarding the results in 2 weeks, please contact this office.    Please hydrate with 64 oz of water if not more. Please take the medications as prescribed. Let me know if your symptoms worsen.

## 2015-09-18 ENCOUNTER — Ambulatory Visit (INDEPENDENT_AMBULATORY_CARE_PROVIDER_SITE_OTHER): Payer: Managed Care, Other (non HMO) | Admitting: Urgent Care

## 2015-09-18 VITALS — BP 130/94 | HR 108 | Temp 98.0°F | Resp 16 | Ht 70.0 in | Wt 233.0 lb

## 2015-09-18 DIAGNOSIS — R0683 Snoring: Secondary | ICD-10-CM | POA: Diagnosis not present

## 2015-09-18 DIAGNOSIS — R059 Cough, unspecified: Secondary | ICD-10-CM

## 2015-09-18 DIAGNOSIS — R6889 Other general symptoms and signs: Secondary | ICD-10-CM

## 2015-09-18 DIAGNOSIS — J4530 Mild persistent asthma, uncomplicated: Secondary | ICD-10-CM

## 2015-09-18 DIAGNOSIS — R062 Wheezing: Secondary | ICD-10-CM | POA: Diagnosis not present

## 2015-09-18 DIAGNOSIS — R05 Cough: Secondary | ICD-10-CM | POA: Diagnosis not present

## 2015-09-18 DIAGNOSIS — E669 Obesity, unspecified: Secondary | ICD-10-CM | POA: Diagnosis not present

## 2015-09-18 LAB — POCT CBC
Granulocyte percent: 52.7 %G (ref 37–80)
HCT, POC: 46.6 % (ref 43.5–53.7)
HEMOGLOBIN: 16.7 g/dL (ref 14.1–18.1)
LYMPH, POC: 2.4 (ref 0.6–3.4)
MCH: 31 pg (ref 27–31.2)
MCHC: 35.7 g/dL — AB (ref 31.8–35.4)
MCV: 86.7 fL (ref 80–97)
MID (cbc): 0.5 (ref 0–0.9)
MPV: 9.9 fL (ref 0–99.8)
PLATELET COUNT, POC: 178 10*3/uL (ref 142–424)
POC Granulocyte: 3.2 (ref 2–6.9)
POC LYMPH PERCENT: 38.6 %L (ref 10–50)
POC MID %: 8.7 % (ref 0–12)
RBC: 5.38 M/uL (ref 4.69–6.13)
RDW, POC: 13.1 %
WBC: 6.1 10*3/uL (ref 4.6–10.2)

## 2015-09-18 MED ORDER — PREDNISONE 20 MG PO TABS: 20.0000 mg | ORAL_TABLET | Freq: Every day | ORAL | Status: DC

## 2015-09-18 MED ORDER — ALBUTEROL SULFATE (2.5 MG/3ML) 0.083% IN NEBU
2.5000 mg | INHALATION_SOLUTION | Freq: Once | RESPIRATORY_TRACT | Status: AC
  Administered 2015-09-18: 2.5 mg via RESPIRATORY_TRACT

## 2015-09-18 MED ORDER — IPRATROPIUM BROMIDE 0.02 % IN SOLN
0.5000 mg | Freq: Once | RESPIRATORY_TRACT | Status: AC
  Administered 2015-09-18: 0.5 mg via RESPIRATORY_TRACT

## 2015-09-18 MED ORDER — MONTELUKAST SODIUM 10 MG PO TABS: 10.0000 mg | ORAL_TABLET | Freq: Every day | ORAL | Status: DC

## 2015-09-18 NOTE — Progress Notes (Signed)
    MRN: [id number] DOB: Aug 24, 1980  Subjective:   Alexander Osborne is a 35 y.o. male presenting for chief complaint of Follow-up  He reports a 4 day history of worsening cough with post-tussive emesis. The cough has now started causing dizziness, throat pain, headaches. Patient was seen here for the same, x-rays were negative. Today, he presents for f/u and reports that the cough is significantly improved but still feels like he has mucus lodged in his throat, causes throat wheezing. He has been using his albuterol inhaler 4 times daily. Of note, patient did start a new sedentary job in 03/2015, [employer]. He has since continued to have upper respiratory symptoms.  Alexander Osborne has a current medication list which includes the following prescription(s): albuterol, albuterol, benzonatate, chlorpheniramine-hydrocodone, ipratropium, loratadine, and ranitidine. Also has No Known Allergies.   Alexander Osborne  has a past medical history of Asthma and Allergy. Also  has past surgical history that includes Eye surgery.  Objective:   Vitals: BP 130/94 mmHg  Pulse 108  Temp(Src) 98 F (36.7 C) (Oral)  Resp 16  Ht 5\' 10"  (1.778 m)  Wt 233 lb (105.688 kg)  BMI 33.43 kg/m2  SpO2 96%  PF 550 L/min  Physical Exam  Constitutional: He is oriented to person, place, and time. He appears well-developed and well-nourished.  HENT:  Mouth/Throat: Oropharynx is clear and moist.  Cardiovascular: Normal rate, regular rhythm and intact distal pulses.  Exam reveals no gallop and no friction rub.   No murmur heard. Pulmonary/Chest: No respiratory distress. He has no wheezes. He has no rales.  Lymphadenopathy:    He has no cervical adenopathy.  Neurological: He is alert and oriented to person, place, and time.  Skin: Skin is warm and dry.   Results for orders placed or performed in visit on 09/18/15 (from the past 24 hour(s))  POCT CBC     Status: Abnormal   Collection Time: 09/18/15  4:05 PM  Result Value Ref Range   WBC 6.1 4.6 - 10.2 K/uL   Lymph, poc 2.4 0.6 - 3.4   POC LYMPH PERCENT 38.6 10 - 50 %L   MID (cbc) 0.5 0 - 0.9   POC MID % 8.7 0 - 12 %M   POC Granulocyte 3.2 2 - 6.9   Granulocyte percent 52.7 37 - 80 %G   RBC 5.38 4.69 - 6.13 M/uL   Hemoglobin 16.7 14.1 - 18.1 g/dL   HCT, POC 40.946.6 81.143.5 - 53.7 %   MCV 86.7 80 - 97 fL   MCH, POC 31.0 27 - 31.2 pg   MCHC 35.7 (A) 31.8 - 35.4 g/dL   RDW, POC 91.413.1 %   Platelet Count, POC 178 142 - 424 K/uL   MPV 9.9 0 - 99.8 fL   Assessment and Plan :   1. Extrinsic asthma, mild persistent, uncomplicated 2. Cough 3. Throat congestion 4. Wheezing - X-ray and CBC findings reassuring. Will start low dose steroid since his cough is improved. His wheezing is still there albeit improved s/p nebulizer treatment. Start Singulair. RTC in 1 week if no improvement.   5. Snoring 6. Obesity - Patient requested referral for sleep study at end of visit due to snoring, pnd.  - Ambulatory referral to Sleep Studies   Wallis BambergMario Graceyn Fodor, PA-C [facility] [phone] 09/18/2015 3:04 PM

## 2015-09-18 NOTE — Patient Instructions (Addendum)
Asthma, Adult Asthma is a recurring condition in which the airways tighten and narrow. Asthma can make it difficult to breathe. It can cause coughing, wheezing, and shortness of breath. Asthma episodes, also called asthma attacks, range from minor to life-threatening. Asthma cannot be cured, but medicines and lifestyle changes can help control it. CAUSES Asthma is believed to be caused by inherited (genetic) and environmental factors, but its exact cause is unknown. Asthma may be triggered by allergens, lung infections, or irritants in the air. Asthma triggers are different for each person. Common triggers include:   Animal dander.  Dust mites.  Cockroaches.  Pollen from trees or grass.  Mold.  Smoke.  Air pollutants such as dust, household cleaners, hair sprays, aerosol sprays, paint fumes, strong chemicals, or strong odors.  Cold air, weather changes, and winds (which increase molds and pollens in the air).  Strong emotional expressions such as crying or laughing hard.  Stress.  Certain medicines (such as aspirin) or types of drugs (such as beta-blockers).  Sulfites in foods and drinks. Foods and drinks that may contain sulfites include dried fruit, potato chips, and sparkling grape juice.  Infections or inflammatory conditions such as the flu, a cold, or an inflammation of the nasal membranes (rhinitis).  Gastroesophageal reflux disease (GERD).  Exercise or strenuous activity. SYMPTOMS Symptoms may occur immediately after asthma is triggered or many hours later. Symptoms include:  Wheezing.  Excessive nighttime or early morning coughing.  Frequent or severe coughing with a common cold.  Chest tightness.  Shortness of breath. DIAGNOSIS  The diagnosis of asthma is made by a review of your medical history and a physical exam. Tests may also be performed. These may include:  Lung function studies. These tests show how much air you breathe in and out.  Allergy  tests.  Imaging tests such as X-rays. TREATMENT  Asthma cannot be cured, but it can usually be controlled. Treatment involves identifying and avoiding your asthma triggers. It also involves medicines. There are 2 classes of medicine used for asthma treatment:   Controller medicines. These prevent asthma symptoms from occurring. They are usually taken every day.  Reliever or rescue medicines. These quickly relieve asthma symptoms. They are used as needed and provide short-term relief. Your health care provider will help you create an asthma action plan. An asthma action plan is a written plan for managing and treating your asthma attacks. It includes a list of your asthma triggers and how they may be avoided. It also includes information on when medicines should be taken and when their dosage should be changed. An action plan may also involve the use of a device called a peak flow meter. A peak flow meter measures how well the lungs are working. It helps you monitor your condition. HOME CARE INSTRUCTIONS   Take medicines only as directed by your health care provider. Speak with your health care provider if you have questions about how or when to take the medicines.  Use a peak flow meter as directed by your health care provider. Record and keep track of readings.  Understand and use the action plan to help minimize or stop an asthma attack without needing to seek medical care.  Control your home environment in the following ways to help prevent asthma attacks:  Do not smoke. Avoid being exposed to secondhand smoke.  Change your heating and air conditioning filter regularly.  Limit your use of fireplaces and wood stoves.  Get rid of pests (such as roaches   and mice) and their droppings.  Throw away plants if you see mold on them.  Clean your floors and dust regularly. Use unscented cleaning products.  Try to have someone else vacuum for you regularly. Stay out of rooms while they are  being vacuumed and for a short while afterward. If you vacuum, use a dust mask from a hardware store, a double-layered or microfilter vacuum cleaner bag, or a vacuum cleaner with a HEPA filter.  Replace carpet with wood, tile, or vinyl flooring. Carpet can trap dander and dust.  Use allergy-proof pillows, mattress covers, and box spring covers.  Wash bed sheets and blankets every week in hot water and dry them in a dryer.  Use blankets that are made of polyester or cotton.  Clean bathrooms and kitchens with bleach. If possible, have someone repaint the walls in these rooms with mold-resistant paint. Keep out of the rooms that are being cleaned and painted.  Wash hands frequently. SEEK MEDICAL CARE IF:   You have wheezing, shortness of breath, or a cough even if taking medicine to prevent attacks.  The colored mucus you cough up (sputum) is thicker than usual.  Your sputum changes from clear or white to yellow, green, gray, or bloody.  You have any problems that may be related to the medicines you are taking (such as a rash, itching, swelling, or trouble breathing).  You are using a reliever medicine more than 2-3 times per week.  Your peak flow is still at 50-79% of your personal best after following your action plan for 1 hour.  You have a fever. SEEK IMMEDIATE MEDICAL CARE IF:   You seem to be getting worse and are unresponsive to treatment during an asthma attack.  You are short of breath even at rest.  You get short of breath when doing very little physical activity.  You have difficulty eating, drinking, or talking due to asthma symptoms.  You develop chest pain.  You develop a fast heartbeat.  You have a bluish color to your lips or fingernails.  You are light-headed, dizzy, or faint.  Your peak flow is less than 50% of your personal best.   This information is not intended to replace advice given to you by your health care provider. Make sure you discuss any  questions you have with your health care provider.   Document Released: 04/07/2005 Document Revised: 12/27/2014 Document Reviewed: 11/04/2012 Elsevier Interactive Patient Education 2016 [employer]    Food Choices for Gastroesophageal Reflux Disease, Adult When you have gastroesophageal reflux disease (GERD), the foods you eat and your eating habits are very important. Choosing the right foods can help ease the discomfort of GERD. WHAT GENERAL GUIDELINES DO I NEED TO FOLLOW?  Choose fruits, vegetables, whole grains, low-fat dairy products, and low-fat meat, fish, and poultry.  Limit fats such as oils, salad dressings, butter, nuts, and avocado.  Keep a food diary to identify foods that cause symptoms.  Avoid foods that cause reflux. These may be different for different people.  Eat frequent small meals instead of three large meals each day.  Eat your meals slowly, in a relaxed setting.  Limit fried foods.  Cook foods using methods other than frying.  Avoid drinking alcohol.  Avoid drinking large amounts of liquids with your meals.  Avoid bending over or lying down until 2-3 hours after eating. WHAT FOODS ARE NOT RECOMMENDED? The following are some foods and drinks that may worsen your symptoms: Vegetables Tomatoes. Tomato juice. Tomato  and spaghetti sauce. Chili peppers. Onion and garlic. Horseradish. Fruits Oranges, grapefruit, and lemon (fruit and juice). Meats High-fat meats, fish, and poultry. This includes hot dogs, ribs, ham, sausage, salami, and bacon. Dairy Whole milk and chocolate milk. Sour cream. Cream. Butter. Ice cream. Cream cheese.  Beverages Coffee and tea, with or without caffeine. Carbonated beverages or energy drinks. Condiments Hot sauce. Barbecue sauce.  Sweets/Desserts Chocolate and cocoa. Donuts. Peppermint and spearmint. Fats and Oils High-fat foods, including [place] fries and potato chips. Other Vinegar. Strong spices, such as black  pepper, white pepper, red pepper, cayenne, curry powder, cloves, ginger, and chili powder. The items listed above may not be a complete list of foods and beverages to avoid. Contact your dietitian for more information.   This information is not intended to replace advice given to you by your health care provider. Make sure you discuss any questions you have with your health care provider.   Document Released: 04/07/2005 Document Revised: 04/28/2014 Document Reviewed: 02/09/2013 Elsevier Interactive Patient Education 2016 [employer].     IF you received an x-ray today, you will receive an invoice from [facility]. Please contact [facility] at [phone] with questions or concerns regarding your invoice.   IF you received labwork today, you will receive an invoice from [employer]. Please contact [facility] at [phone] with questions or concerns regarding your invoice.   Our billing staff will not be able to assist you with questions regarding bills from these companies.  You will be contacted with the lab results as soon as they are available. The fastest way to get your results is to activate your My Chart account. Instructions are located on the last page of this paperwork. If you have not heard from [place] regarding the results in 2 weeks, please contact this office.

## 2015-10-09 ENCOUNTER — Encounter: Payer: Self-pay | Admitting: Neurology

## 2015-10-09 ENCOUNTER — Ambulatory Visit (INDEPENDENT_AMBULATORY_CARE_PROVIDER_SITE_OTHER): Payer: Managed Care, Other (non HMO) | Admitting: Neurology

## 2015-10-09 VITALS — BP 129/83 | HR 89 | Resp 18 | Ht 70.0 in | Wt 228.0 lb

## 2015-10-09 DIAGNOSIS — G4733 Obstructive sleep apnea (adult) (pediatric): Secondary | ICD-10-CM

## 2015-10-09 DIAGNOSIS — E669 Obesity, unspecified: Secondary | ICD-10-CM

## 2015-10-09 DIAGNOSIS — R351 Nocturia: Secondary | ICD-10-CM | POA: Diagnosis not present

## 2015-10-09 DIAGNOSIS — G4753 Recurrent isolated sleep paralysis: Secondary | ICD-10-CM

## 2015-10-09 DIAGNOSIS — G4710 Hypersomnia, unspecified: Secondary | ICD-10-CM

## 2015-10-09 NOTE — Patient Instructions (Signed)

## 2015-10-09 NOTE — Progress Notes (Signed)
Subjective:    Patient ID: Alexander Osborne is a 35 y.o. male.  HPI     [name], MD, PhD [facility] [address] [address] [place], [place] [address]  Dear Marquita Palms,   I saw your patient, Alexander Osborne, upon your kind request in my neurologic clinic today for initial consultation of his sleep disorder, in particular, concern for underlying obstructive sleep apnea. The patient is unaccompanied today. As you know, Alexander Osborne is a 36 year old right-handed gentleman with an underlying medical history of asthma, allergy, and obesity, who reports snoring and excessive daytime somnolence. I reviewed your office note from 09/18/2015. He lives with his spouse, who reports snoring and breathing pauses while he is asleep. His partner/spouse has voiced concern about choking spells or gasping sounds while patient is asleep. Patient denies any restless legs type symptoms but has some difficulty getting comfortable at night and falling asleep. He tends to listen to music on his cell phone at night, sometimes he plays on his cell phone. He does not typically watch TV in bed. There is no pets in the household, no children. He works typically from 10 AM to 7 PM. 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 He drinks alcohol about once a week on average, quit smoking in November 2016, and denies taking any illicit drugs and does not typically consume caffeine on a day-to-day basis. He has no family history of obstructive sleep apnea as far she can tell. He tries to go to bed around 10-11, wakeup time is around 6:30 AM currently. He tries to exercise at the mornings. He is trying to lose weight. He has had episodes of sleep paralysis, this happens intermittently and has been ongoing for years. There is no family history of narcolepsy, he denies any hypnagogic or hypnopompic hallucinations or cataplexy. He denies any sinister daytime somnolence. He does not always wake up rested, Epworth  Sleepiness Scale score is 8 out of 24 today, his fatigue score is 18 out of 63. He denies morning headaches. He has nocturia, usually once per night on average. He reports reflux symptoms, sometimes at night. Worse when he gained weight. He also reports allergy symptoms and nasal congestion. He is on allergy medication and nasal spray.  His Past Medical History Is Significant For: Past Medical History  Diagnosis Date  . Asthma   . Allergy     His Past Surgical History Is Significant For: Past Surgical History  Procedure Laterality Date  . Eye surgery      His Family History Is Significant For: Family History  Problem Relation Age of Onset  . Cancer Mother   . Hypertension Father     His Social History Is Significant For: Social History   Social History  . Marital Status: Married    Spouse Name: N/A  . Number of Children: 0  . Years of Education: N/A   Occupational History  . [employer]    Social History Main Topics  . Smoking status: Former Games developer  . Smokeless tobacco: None     Comment: Quit 02/2015  . Alcohol Use: 0.6 oz/week    1 Standard drinks or equivalent per week  . Drug Use: No  . Sexual Activity: Not Asked   Other Topics Concern  . None   Social History Narrative   Denies caffeine use     His Allergies Are:  No Known Allergies:   His Current Medications Are:  Outpatient Encounter Prescriptions as of  10/09/2015  Medication Sig  . albuterol (PROVENTIL HFA;VENTOLIN HFA) 108 (90 Base) MCG/ACT inhaler Inhale 2 puffs into the lungs every 4 (four) hours as needed for wheezing or shortness of breath (cough, shortness of breath or wheezing.).  Marland Kitchen. ALBUTEROL IN Inhale into the lungs. Reported on 09/18/2015  . ipratropium (ATROVENT) 0.03 % nasal spray Place 2 sprays into both nostrils 2 (two) times daily.  . Loratadine (CLARITIN PO) Take by mouth. Reported on 09/18/2015  . montelukast (SINGULAIR) 10 MG tablet Take 1 tablet (10 mg total) by mouth at bedtime.  .  ranitidine (ZANTAC) 150 MG tablet Take 150 mg by mouth 2 (two) times daily.  . [DISCONTINUED] benzonatate (TESSALON) 100 MG capsule Take 1-2 capsules (100-200 mg total) by mouth 3 (three) times daily as needed for cough.  . [DISCONTINUED] chlorpheniramine-HYDROcodone (TUSSIONEX PENNKINETIC ER) 10-8 MG/5ML SUER Take 5 mLs by mouth at bedtime as needed.  . [DISCONTINUED] predniSONE (DELTASONE) 20 MG tablet Take 1 tablet (20 mg total) by mouth daily with breakfast. Take 2 tablets daily with breakfast.   No facility-administered encounter medications on file as of 10/09/2015.  :  Review of Systems:  Out of a complete 14 point review of systems, all are reviewed and negative with the exception of these symptoms as listed below:   Review of Systems  Neurological:       Night terrors, there are time when patient feels awake but paralyzed, snoring, witnessed apnea, denies taking naps.   Epworth Sleepiness Scale 0= would never doze 1= slight chance of dozing 2= moderate chance of dozing 3= high chance of dozing  Sitting and reading:1 Watching TV:1 Sitting inactive in a public place (ex. Theater or meeting):0 As a passenger in a car for an hour without a break:3 Lying down to rest in the afternoon:3 Sitting and talking to someone:0 Sitting quietly after lunch (no alcohol)0: In a car, while stopped in traffic:0 Total:8   Objective:  Neurologic Exam  Physical Exam Physical Examination:   Filed Vitals:   10/09/15 0901  BP: 129/83  Pulse: 89  Resp: 18    General Examination: The patient is a very pleasant 35 y.o. male in no acute distress. He appears well-developed and well-nourished and well groomed.   HEENT: Normocephalic, atraumatic, pupils are equal, round and reactive to light and accommodation. Funduscopic exam is normal with sharp disc margins noted. Extraocular tracking is good without limitation to gaze excursion or nystagmus noted. Normal smooth pursuit is noted. Hearing is  grossly intact. Tympanic membranes are clear bilaterally. Face is symmetric with normal facial animation and normal facial sensation. Speech is clear with no dysarthria noted. There is no hypophonia. There is no lip, neck/head, jaw or voice tremor. Neck is supple with full range of passive and active motion. There are no carotid bruits on auscultation. Oropharynx exam reveals: mild mouth dryness, good dental hygiene and marked airway crowding, due to smaller airway entry, larger uvula, larger tonsils, 3+ bilaterally, right more. Mallampati is class I. Tongue protrudes centrally and palate elevates symmetrically. Neck size is 18 inches. He has a moderate overbite. Nasal inspection reveals no significant nasal mucosal bogginess or redness and no septal deviation.   Chest: Clear to auscultation without wheezing, rhonchi or crackles noted.  Heart: S1+S2+0, regular and normal without murmurs, rubs or gallops noted.   Abdomen: Soft, non-tender and non-distended with normal bowel sounds appreciated on auscultation.  Extremities: There is no pitting edema in the distal lower extremities bilaterally. Pedal pulses are intact.  Skin: Warm and dry without trophic changes noted.  Musculoskeletal: exam reveals no obvious joint deformities, tenderness or joint swelling or erythema.   Neurologically:  Mental status: The patient is awake, alert and oriented in all 4 spheres. His immediate and remote memory, attention, language skills and fund of knowledge are appropriate. There is no evidence of aphasia, agnosia, apraxia or anomia. Speech is clear with normal prosody and enunciation. Thought process is linear. Mood is normal and affect is normal.  Cranial nerves II - XII are as described above under HEENT exam. In addition: shoulder shrug is normal with equal shoulder height noted. Motor exam: Normal bulk, strength and tone is noted. There is no drift, tremor or rebound. Romberg is negative. Reflexes are 2+  throughout. Fine motor skills and coordination: intact with normal finger taps, normal hand movements, normal rapid alternating patting, normal foot taps and normal foot agility.  Cerebellar testing: No dysmetria or intention tremor on finger to nose testing. Heel to shin is unremarkable bilaterally. There is no truncal or gait ataxia.  Sensory exam: intact to light touch, pinprick, vibration, temperature sense in the upper and lower extremities.  Gait, station and balance: He stands easily. No veering to one side is noted. No leaning to one side is noted. Posture is age-appropriate and stance is narrow based. Gait shows normal stride length and normal pace. No problems turning are noted. He turns en bloc. Tandem walk is unremarkable.   Assessment and Plan:   In summary, Caedon Bond is a very pleasant 35 y.o.-year old male with an underlying medical history of asthma, allergy, and obesity, whose history and physical exam are in keeping with obstructive sleep apnea (OSA). 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 He is advised that there is no specific treatment for this. He states that he has learned to deal with it. He denies any other symptoms of narcolepsy. I had a long chat with the patient about my findings and the diagnosis of OSA, its prognosis and treatment options. We talked about medical treatments, surgical interventions and non-pharmacological approaches. I explained in particular the risks and ramifications of untreated moderate to severe OSA, especially with respect to developing cardiovascular disease down the Road, including congestive heart failure, difficult to treat hypertension, cardiac arrhythmias, or stroke. Even type 2 diabetes has, in part, been linked to untreated OSA. Symptoms of untreated OSA include daytime sleepiness, memory problems, mood irritability and mood disorder such as depression and anxiety, lack of energy, as well as recurrent headaches, especially  morning headaches. We talked about trying to maintain a healthy lifestyle in general, as well as the importance of weight control. I encouraged the patient to eat healthy, exercise daily and keep well hydrated, to keep a scheduled bedtime and wake time routine, to not skip any meals and eat healthy snacks in between meals. I advised the patient not to drive when feeling sleepy. I recommended the following at this time: sleep study with potential positive airway pressure titration. (We will score hypopneas at 4% and split the sleep study into diagnostic and treatment portion, if the estimated. 2 hour AHI is >15/h).   I explained the sleep test procedure to the patient and also outlined possible surgical and non-surgical treatment options of OSA, including the use of a custom-made dental device (which would require a referral to a specialist dentist or oral surgeon), upper airway surgical options, such as pillar implants, radiofrequency surgery, tongue base surgery, and UPPP (which would involve a referral  to an ENT surgeon). Rarely, jaw surgery such as mandibular advancement may be considered.  I also explained the CPAP treatment option to the patient, who indicated that he would be willing to try CPAP if the need arises. I explained the importance of being compliant with PAP treatment, not only for insurance purposes but primarily to improve His symptoms, and for the patient's long term health benefit, including to reduce His cardiovascular risks. I answered all his questions today and the patient was in agreement. I would like to see him back after the sleep study is completed and encouraged him to call with any interim questions, concerns, problems or updates.   Thank you very much for allowing me to participate in the care of this nice patient. If I can be of any further assistance to you please do not hesitate to call me at [phone].  Sincerely,   [name], MD, PhD

## 2015-11-01 ENCOUNTER — Telehealth: Payer: Self-pay | Admitting: Neurology

## 2015-11-01 DIAGNOSIS — G4733 Obstructive sleep apnea (adult) (pediatric): Secondary | ICD-10-CM

## 2015-11-01 DIAGNOSIS — E669 Obesity, unspecified: Secondary | ICD-10-CM

## 2015-11-01 DIAGNOSIS — R351 Nocturia: Secondary | ICD-10-CM

## 2015-11-01 DIAGNOSIS — G4710 Hypersomnia, unspecified: Secondary | ICD-10-CM

## 2015-11-01 DIAGNOSIS — G4753 Recurrent isolated sleep paralysis: Secondary | ICD-10-CM

## 2015-11-01 NOTE — Telephone Encounter (Signed)
[employer] denied split sleep study but approved HST.  Could I get an order for a HST?

## 2015-11-05 NOTE — Telephone Encounter (Signed)
Order is in.

## 2015-11-05 NOTE — Addendum Note (Signed)
Addended by: Crisoforo OxfordURNER, Johnni Wunschel S on: 11/05/2015 10:14 AM   Modules accepted: Orders

## 2015-12-04 ENCOUNTER — Encounter (INDEPENDENT_AMBULATORY_CARE_PROVIDER_SITE_OTHER): Payer: Managed Care, Other (non HMO) | Admitting: Neurology

## 2015-12-04 DIAGNOSIS — G4733 Obstructive sleep apnea (adult) (pediatric): Secondary | ICD-10-CM | POA: Diagnosis not present

## 2015-12-04 DIAGNOSIS — G4710 Hypersomnia, unspecified: Secondary | ICD-10-CM

## 2015-12-04 DIAGNOSIS — R351 Nocturia: Secondary | ICD-10-CM

## 2015-12-04 DIAGNOSIS — G4753 Recurrent isolated sleep paralysis: Secondary | ICD-10-CM

## 2015-12-04 DIAGNOSIS — E669 Obesity, unspecified: Secondary | ICD-10-CM

## 2015-12-11 ENCOUNTER — Telehealth: Payer: Self-pay | Admitting: Neurology

## 2015-12-11 NOTE — Telephone Encounter (Signed)
Patient referred by Wallis BambergMario Mani, seen by me on 10/09/15, HST on 12/04/15.   Please call and notify the patient that the recent home sleep test did not show any significant obstructive sleep apnea. Patient can follow up with the referring provider. I can see patient in FU if desired.  A copy of the report will be sent to the patient, the PCP and referring MD, if other than PCP.  Once you have spoken to patient, you can close this encounter.   Thanks,  Huston FoleySaima Malynda Smolinski, MD, PhD [facility]

## 2015-12-12 NOTE — Telephone Encounter (Signed)
I called and got personal vm. Per DPR, I left results and recommendations below on his vm. Left call back number if he needs us or would like to f/u with Dr. Frances FurbishAthar. Report sent to referring doctor.

## 2016-04-04 DIAGNOSIS — Z947 Corneal transplant status: Secondary | ICD-10-CM | POA: Insufficient documentation

## 2016-06-09 ENCOUNTER — Ambulatory Visit: Payer: Self-pay | Admitting: Diagnostic Neuroimaging

## 2017-04-29 ENCOUNTER — Ambulatory Visit: Payer: Managed Care, Other (non HMO) | Admitting: Family Medicine

## 2017-04-29 ENCOUNTER — Encounter: Payer: Self-pay | Admitting: Family Medicine

## 2017-04-29 ENCOUNTER — Other Ambulatory Visit: Payer: Self-pay

## 2017-04-29 DIAGNOSIS — R5383 Other fatigue: Secondary | ICD-10-CM | POA: Diagnosis not present

## 2017-04-29 DIAGNOSIS — J4520 Mild intermittent asthma, uncomplicated: Secondary | ICD-10-CM | POA: Diagnosis not present

## 2017-04-29 DIAGNOSIS — Z13220 Encounter for screening for lipoid disorders: Secondary | ICD-10-CM | POA: Diagnosis not present

## 2017-04-29 DIAGNOSIS — E119 Type 2 diabetes mellitus without complications: Secondary | ICD-10-CM | POA: Diagnosis not present

## 2017-04-29 DIAGNOSIS — Z23 Encounter for immunization: Secondary | ICD-10-CM

## 2017-04-29 DIAGNOSIS — J301 Allergic rhinitis due to pollen: Secondary | ICD-10-CM | POA: Diagnosis not present

## 2017-04-29 DIAGNOSIS — R1030 Lower abdominal pain, unspecified: Secondary | ICD-10-CM

## 2017-04-29 DIAGNOSIS — Z131 Encounter for screening for diabetes mellitus: Secondary | ICD-10-CM | POA: Diagnosis not present

## 2017-04-29 LAB — POCT URINALYSIS DIP (MANUAL ENTRY)
BILIRUBIN UA: NEGATIVE mg/dL
Bilirubin, UA: NEGATIVE
Blood, UA: NEGATIVE
Glucose, UA: NEGATIVE mg/dL
Leukocytes, UA: NEGATIVE
Nitrite, UA: NEGATIVE
Protein Ur, POC: NEGATIVE mg/dL
SPEC GRAV UA: 1.025 (ref 1.010–1.025)
UROBILINOGEN UA: 1 U/dL
pH, UA: 7 (ref 5.0–8.0)

## 2017-04-29 LAB — POCT GLYCOSYLATED HEMOGLOBIN (HGB A1C): Hemoglobin A1C: 7.1

## 2017-04-29 LAB — GLUCOSE, POCT (MANUAL RESULT ENTRY): POC GLUCOSE: 122 mg/dL — AB (ref 70–99)

## 2017-04-29 NOTE — Patient Instructions (Addendum)
   IF you received an x-ray today, you will receive an invoice from [facility]. Please contact [facility] at [phone] with questions or concerns regarding your invoice.   IF you received labwork today, you will receive an invoice from [facility]. Please contact [facility] at [phone] with questions or concerns regarding your invoice.   Our billing staff will not be able to assist you with questions regarding bills from these companies.  You will be contacted with the lab results as soon as they are available. The fastest way to get your results is to activate your My Chart account. Instructions are located on the last page of this paperwork. If you have not heard from us regarding the results in 2 weeks, please contact this office.      Diabetes Mellitus and Standards of Medical Care Managing diabetes (diabetes mellitus) can be complicated. Your diabetes treatment may be managed by a team of health care providers, including:  A diet and nutrition specialist (registered dietitian).  A nurse.  A certified diabetes educator (CDE).  A diabetes specialist (endocrinologist).  An eye doctor.  A primary care provider.  A dentist.  Your health care providers follow a schedule in order to help you get the best quality of care. The following schedule is a general guideline for your diabetes management plan. Your health care providers may also give you more specific instructions. HbA1c ( hemoglobin A1c) test This test provides information about blood sugar (glucose) control over the previous 2-3 months. It is used to check whether your diabetes management plan needs to be adjusted.  If you are meeting your treatment goals, this test is done at least 2 times a year.  If you are not meeting treatment goals or if your treatment goals have changed, this test is done 4 times a year.  Blood pressure test  This test is done at every routine medical visit. For most  people, the goal is less than 130/80. Ask your health care provider what your goal blood pressure should be. Dental and eye exams  Visit your dentist two times a year.  If you have type 1 diabetes, get an eye exam 3-5 years after you are diagnosed, and then once a year after your first exam. ? If you were diagnosed with type 1 diabetes as a child, get an eye exam when you are age 10 or older and have had diabetes for 3-5 years. After the first exam, you should get an eye exam once a year.  If you have type 2 diabetes, have an eye exam as soon as you are diagnosed, and then once a year after your first exam. Foot care exam  Visual foot exams are done at every routine medical visit. The exams check for cuts, bruises, redness, blisters, sores, or other problems with the feet.  A complete foot exam is done by your health care provider once a year. This exam includes an inspection of the structure and skin of your feet, and a check of the pulses and sensation in your feet. ? Type 1 diabetes: Get your first exam 3-5 years after diagnosis. ? Type 2 diabetes: Get your first exam as soon as you are diagnosed.  Check your feet every day for cuts, bruises, redness, blisters, or sores. If you have any of these or other problems that are not healing, contact your health care provider. Kidney function test ( urine microalbumin)  This test is done once a year. ? Type 1 diabetes:   Get your first test 5 years after diagnosis. ? Type 2 diabetes: Get your first test as soon as you are diagnosed.  If you have chronic kidney disease (CKD), get a serum creatinine and estimated glomerular filtration rate (eGFR) test once a year. Lipid profile (cholesterol, HDL, LDL, triglycerides)  This test should be done when you are diagnosed with diabetes, and every 5 years after the first test. If you are on medicines to lower your cholesterol, you may need to get this test done every year. ? The goal for LDL is less than  100 mg/dL (5.5 mmol/L). If you are at high risk, the goal is less than 70 mg/dL (3.9 mmol/L). ? The goal for HDL is 40 mg/dL (2.2 mmol/L) for men and 50 mg/dL(2.8 mmol/L) for women. An HDL cholesterol of 60 mg/dL (3.3 mmol/L) or higher gives some protection against heart disease. ? The goal for triglycerides is less than 150 mg/dL (8.3 mmol/L). Immunizations  The yearly flu (influenza) vaccine is recommended for everyone 6 months or older who has diabetes.  The pneumonia (pneumococcal) vaccine is recommended for everyone 2 years or older who has diabetes. If you are 65 or older, you may get the pneumonia vaccine as a series of two separate shots.  The hepatitis B vaccine is recommended for adults shortly after they have been diagnosed with diabetes.  The Tdap (tetanus, diphtheria, and pertussis) vaccine should be given: ? According to normal childhood vaccination schedules, for children. ? Every 10 years, for adults who have diabetes.  The shingles vaccine is recommended for people who have had chicken pox and are 50 years or older. Mental and emotional health  Screening for symptoms of eating disorders, anxiety, and depression is recommended at the time of diagnosis and afterward as needed. If your screening shows that you have symptoms (you have a positive screening result), you may need further evaluation and be referred to a mental health care provider. Diabetes self-management education  Education about how to manage your diabetes is recommended at diagnosis and ongoing as needed. Treatment plan  Your treatment plan will be reviewed at every medical visit. Summary  Managing diabetes (diabetes mellitus) can be complicated. Your diabetes treatment may be managed by a team of health care providers.  Your health care providers follow a schedule in order to help you get the best quality of care.  Standards of care including having regular physical exams, blood tests, blood pressure  monitoring, immunizations, screening tests, and education about how to manage your diabetes.  Your health care providers may also give you more specific instructions based on your individual health. This information is not intended to replace advice given to you by your health care provider. Make sure you discuss any questions you have with your health care provider. Document Released: 02/02/2009 Document Revised: 01/04/2016 Document Reviewed: 01/04/2016 Elsevier Interactive Patient Education  2018 Elsevier Inc.  

## 2017-04-29 NOTE — Progress Notes (Signed)
Subjective:    Patient ID: Alexander Osborne, male    DOB: 08/29/1980, 37 y.o.   MRN: [id number]  04/29/2017  Establish Care; Fatigue; and Abdominal Pain    HPI This 37 y.o. male presents for evaluation of WEAKNESS/DIZZY/FATIGUE.   Occurs upon awakening; body feels exhausted.  Onset two months ago.    Feel the same way when have a bowl of cereal without eating anything of substance.  Makes shaky and tired.  Concerned about diabetes.   Only happens when least active; if active, feels well.    Allergies and asthma history; currently no medications; not well controlled.  Takes Claritin 10mg  daily, Flonase.  No Singulair or Atrovent. No allergy testing.  Home sleep study negative; husband reports that choking at night.  Rest well. Feels shaky in morning.  Home sleep study 11/2016.  With eating, suffers abdominal pain; must have painful defecation.  Can be hard, diarrhea, soft.  Suffers with horrible lower abdominal pain; no upper pain; lower abdomen.  Goes away as soon as relieving self.  Mother with similar symptoms; this is how mother found out about pancreatic cancer because of sharp pain.  When eats better, pain is less severe.  Happens mainly at night.  Has been woken up with abdominal pain.  Has four bowel movements per day.  One bowel movement is painful.  No previous specialist.  Ongoing since twenties.  Started in [place], [place].   Bedtime 11:00; wakes up 700 or 10-11.  Lunch: leftovers; rice, beans, meat/pork; husband is [place] or [place] food No teas/sodas/juice   BP Readings from Last 3 Encounters:  10/09/15 129/83  09/18/15 (!) 130/94  09/15/15 110/80   Wt Readings from Last 3 Encounters:  10/09/15 228 lb (103.4 kg)  09/18/15 233 lb (105.7 kg)  09/15/15 237 lb 6 oz (107.7 kg)   Immunization History  Administered Date(s) Administered  . Influenza,inj,Quad PF,6+ Mos 04/29/2017  . Tdap 04/29/2017    Review of Systems  Constitutional: Positive for fatigue. Negative for  activity change, appetite change, chills, diaphoresis, fever and unexpected weight change.  HENT: Negative for congestion, dental problem, drooling, ear discharge, ear pain, facial swelling, hearing loss, mouth sores, nosebleeds, postnasal drip, rhinorrhea, sinus pressure, sneezing, sore throat, tinnitus, trouble swallowing and voice change.   Eyes: Negative for photophobia, pain, discharge, redness, itching and visual disturbance.  Respiratory: Negative for apnea, cough, choking, chest tightness, shortness of breath, wheezing and stridor.   Cardiovascular: Negative for chest pain, palpitations and leg swelling.  Gastrointestinal: Positive for abdominal pain. Negative for abdominal distention, anal bleeding, blood in stool, constipation, diarrhea, nausea and vomiting.  Endocrine: Negative for cold intolerance, heat intolerance, polydipsia, polyphagia and polyuria.  Genitourinary: Negative for decreased urine volume, difficulty urinating, discharge, dysuria, enuresis, flank pain, frequency, genital sores, hematuria, penile pain, penile swelling, scrotal swelling, testicular pain and urgency.  Musculoskeletal: Negative for arthralgias, back pain, gait problem, joint swelling, myalgias, neck pain and neck stiffness.  Skin: Negative for color change, pallor, rash and wound.  Allergic/Immunologic: Negative for environmental allergies, food allergies and immunocompromised state.  Neurological: Negative for dizziness, tremors, seizures, syncope, facial asymmetry, speech difficulty, weakness, light-headedness, numbness and headaches.  Hematological: Negative for adenopathy. Does not bruise/bleed easily.  Psychiatric/Behavioral: Negative for agitation, behavioral problems, confusion, decreased concentration, dysphoric mood, hallucinations, self-injury, sleep disturbance and suicidal ideas. The patient is not nervous/anxious and is not hyperactive.     Past Medical History:  Diagnosis Date  . Allergy   .  Asthma  Past Surgical History:  Procedure Laterality Date  . corneal transplant left    . EYE SURGERY     No Known Allergies Current Outpatient Medications on File Prior to Visit  Medication Sig Dispense Refill  . albuterol (PROVENTIL HFA;VENTOLIN HFA) 108 (90 Base) MCG/ACT inhaler Inhale 2 puffs into the lungs every 4 (four) hours as needed for wheezing or shortness of breath (cough, shortness of breath or wheezing.). 1 Inhaler 12  . ALBUTEROL IN Inhale into the lungs. Reported on 09/18/2015    . Loratadine (CLARITIN PO) Take by mouth. Reported on 09/18/2015     No current facility-administered medications on file prior to visit.    Social History   Socioeconomic History  . Marital status: Married    Spouse name: Not on file  . Number of children: 0  . Years of education: Not on file  . Highest education level: Not on file  Social Needs  . Financial resource strain: Not on file  . Food insecurity - worry: Not on file  . Food insecurity - inability: Not on file  . Transportation needs - medical: Not on file  . Transportation needs - non-medical: Not on file  Occupational History  . Occupation: Event organisermanager    Employer: [employer]  Tobacco Use  . Smoking status: Former Games developermoker  . Smokeless tobacco: Never Used  . Tobacco comment: Quit 02/2015  Substance and Sexual Activity  . Alcohol use: Yes    Alcohol/week: 0.6 oz    Types: 1 Standard drinks or equivalent per week  . Drug use: No  . Sexual activity: Yes    Partners: Male    Birth control/protection: None    Comment: SS partner  Other Topics Concern  . Not on file  Social History Narrative   Marital status: married x 4 years; happily married.       Lives; with husbnad      Employment: Building control surveyorretail management at [employer]      Tobacco: socially; weekends      Alcohol: 1-3 drinks per week      Drugs: none      Exercise: none   Denies caffeine use    Family History  Problem Relation Age of Onset  . Cancer Mother  [id number]       pancreatic cancer  . Hypertension Father   . Heart disease Father [id number]       CAD  . Bipolar disorder Brother   . Diabetes Sister        Objective:    There were no vitals taken for this visit. Physical Exam  Constitutional: He is oriented to person, place, and time. He appears well-developed and well-nourished. No distress.  HENT:  Head: Normocephalic and atraumatic.  Right Ear: External ear normal.  Left Ear: External ear normal.  Nose: Nose normal.  Mouth/Throat: Oropharynx is clear and moist.  Eyes: Conjunctivae and EOM are normal. Pupils are equal, round, and reactive to light.  Neck: Normal range of motion. Neck supple. Carotid bruit is not present. No thyromegaly present.  Cardiovascular: Normal rate, regular rhythm, normal heart sounds and intact distal pulses. Exam reveals no gallop and no friction rub.  No murmur heard. Pulmonary/Chest: Effort normal and breath sounds normal. He has no wheezes. He has no rales.  Abdominal: Soft. Bowel sounds are normal. He exhibits no distension and no mass. There is no tenderness. There is no rebound and no guarding.  Genitourinary: Penis normal.  Musculoskeletal:  Right shoulder: Normal.       Left shoulder: Normal.       Cervical back: Normal.  Lymphadenopathy:    He has no cervical adenopathy.  Neurological: He is alert and oriented to person, place, and time. He has normal reflexes. No cranial nerve deficit. He exhibits normal muscle tone. Coordination normal.  Skin: Skin is warm and dry. No rash noted. He is not diaphoretic.  Psychiatric: He has a normal mood and affect. His behavior is normal. Judgment and thought content normal.   No results found. Depression screen [facility] 2/9 04/29/2017 09/18/2015 09/15/2015 05/06/2015 04/17/2015  Decreased Interest 0 0 0 0 0  Down, Depressed, Hopeless 0 0 0 0 0  PHQ - 2 Score 0 0 0 0 0   Fall Risk  04/29/2017 09/18/2015 09/15/2015  Falls in the past year? No No No    Results for  orders placed or performed in visit on 04/29/17  CBC with Differential/Platelet  Result Value Ref Range   WBC 7.6 3.4 - 10.8 x10E3/uL   RBC 5.17 4.14 - 5.80 x10E6/uL   Hemoglobin 15.6 13.0 - 17.7 g/dL   Hematocrit 98.1 19.1 - 51.0 %   MCV 86 79 - 97 fL   MCH 30.2 26.6 - 33.0 pg   MCHC 35.1 31.5 - 35.7 g/dL   RDW 47.8 29.5 - 62.1 %   Platelets 230 150 - 379 x10E3/uL   Neutrophils 62 Not Estab. %   Lymphs 28 Not Estab. %   Monocytes 7 Not Estab. %   Eos 2 Not Estab. %   Basos 1 Not Estab. %   Neutrophils Absolute 4.8 1.4 - 7.0 x10E3/uL   Lymphocytes Absolute 2.1 0.7 - 3.1 x10E3/uL   Monocytes Absolute 0.5 0.1 - 0.9 x10E3/uL   EOS (ABSOLUTE) 0.2 0.0 - 0.4 x10E3/uL   Basophils Absolute 0.0 0.0 - 0.2 x10E3/uL   Immature Granulocytes 0 Not Estab. %   Immature Grans (Abs) 0.0 0.0 - 0.1 x10E3/uL   Hematology Comments: Note:   Lipid panel  Result Value Ref Range   Cholesterol, Total 196 100 - 199 mg/dL   Triglycerides 308 (H) 0 - 149 mg/dL   HDL 30 (L) >65 mg/dL   VLDL Cholesterol Cal Comment 5 - 40 mg/dL   LDL Calculated Comment 0 - 99 mg/dL   Chol/HDL Ratio 6.5 (H) 0.0 - 5.0 ratio  TSH  Result Value Ref Range   TSH 1.140 0.450 - 4.500 uIU/mL  Comprehensive metabolic panel  Result Value Ref Range   Glucose 124 (H) 65 - 99 mg/dL   BUN 10 6 - 20 mg/dL   Creatinine, Ser 7.84 0.76 - 1.27 mg/dL   GFR calc non Af Amer 116 >59 mL/min/1.73   GFR calc Af Amer 134 >59 mL/min/1.73   BUN/Creatinine Ratio 12 9 - 20   Sodium 141 134 - 144 mmol/L   Potassium 4.3 3.5 - 5.2 mmol/L   Chloride 104 96 - 106 mmol/L   CO2 22 20 - 29 mmol/L   Calcium 9.8 8.7 - 10.2 mg/dL   Total Protein 8.0 6.0 - 8.5 g/dL   Albumin 4.8 3.5 - 5.5 g/dL   Globulin, Total 3.2 1.5 - 4.5 g/dL   Albumin/Globulin Ratio 1.5 1.2 - 2.2   Bilirubin Total 0.2 0.0 - 1.2 mg/dL   Alkaline Phosphatase 71 39 - 117 IU/L   AST 23 0 - 40 IU/L   ALT 44 0 - 44 IU/L  POCT urinalysis dipstick  Result  Value Ref Range   Color, UA  yellow yellow   Clarity, UA clear clear   Glucose, UA negative negative mg/dL   Bilirubin, UA negative negative   Ketones, POC UA negative negative mg/dL   Spec Grav, UA 9.147 8.295 - 1.025   Blood, UA negative negative   pH, UA 7.0 5.0 - 8.0   Protein Ur, POC negative negative mg/dL   Urobilinogen, UA 1.0 0.2 or 1.0 E.U./dL   Nitrite, UA Negative Negative   Leukocytes, UA Negative Negative  POCT glucose (manual entry)  Result Value Ref Range   POC Glucose 122 (A) 70 - 99 mg/dl  POCT glycosylated hemoglobin (Hb A1C)  Result Value Ref Range   Hemoglobin A1C 7.1        Assessment & Plan:   1. Other fatigue   2. Lower abdominal pain   3. Type 2 diabetes mellitus without complication, without long-term current use of insulin (HCC)   4. Mild intermittent extrinsic asthma without complication   5. Seasonal allergic rhinitis due to pollen   6. Screening for diabetes mellitus   7. Screening, lipid   8. Need for prophylactic vaccination and inoculation against influenza   9. Need for Tdap vaccination     New onset fatigue.  Patient very concerned with new onset diabetes due to strong family history.  Status post in-home sleep study that was negative for obstructive sleep apnea.  Glucose 122 in the office and hemoglobin A1c 7.1 which is consistent with diabetes mellitus type 2.   I recommend weight loss, exercise, and low-carbohydrate low-sugar food choices. You should AVOID: regular sodas, sweetened tea, fruit juices.  You should LIMIT: breads, pastas, rice, potatoes, and desserts/sweets.  I would recommend limiting your total carbohydrate intake per meal to 45 grams; I would limit your total carbohydrate intake per snack to 30 grams.  I would also have a goal of 60 grams of protein intake per day; this would equal 10-15 grams of protein per meal and 5-10 grams of protein per snack.  Refer for diabetic education and nutrition counseling.  Prescription for glucometer, lancets, test strips  provided.  No medication prescribed at this time as I do think patient can significantly improve glucose levels with weight loss, dietary modification, and exercise.  Chronic lower abdominal pain intermittently.  Obtain labs.  Refer to gastroenterology to evaluate further.  Most consistent with irritable bowel syndrome yet warrants further evaluation to rule out inflammatory bowel disease.  Mild intermittent asthma and allergic rhinitis.  Refer for allergy testing.  Status post Tdap and influenza vaccines in office.  Recommend weight loss, exercise for 30-60 minutes five days per week; recommend 2000 kcal restriction per day with a minimum of 60 grams of protein per day.  -prolonged face-to-face for 40 minutes with greater than 50% of time dedicated to counseling and coordination of care.   Orders Placed This Encounter  Procedures  . Flu Vaccine QUAD 36+ mos IM  . Tdap vaccine greater than or equal to 7yo IM  . CBC with Differential/Platelet  . Lipid panel    Order Specific Question:   Has the patient fasted?    Answer:   No  . TSH  . Comprehensive metabolic panel  . Ambulatory referral to Allergy    Referral Priority:   Routine    Referral Type:   Allergy Testing    Referral Reason:   Specialty Services Required    Requested Specialty:   Allergy    Number of  Visits Requested:   1  . Ambulatory referral to Gastroenterology    Referral Priority:   Routine    Referral Type:   Consultation    Referral Reason:   Specialty Services Required    Number of Visits Requested:   1  . Ambulatory referral to diabetic education    Referral Priority:   Routine    Referral Type:   Consultation    Referral Reason:   Specialty Services Required    Number of Visits Requested:   1  . POCT urinalysis dipstick  . POCT glucose (manual entry)  . POCT glycosylated hemoglobin (Hb A1C)   No orders of the defined types were placed in this encounter.   Return in about 3 months (around 07/28/2017) for  recheck.   Odessa Morren Paulita Fujita, M.D. [facility] previously [facility] [address] [place], [place]  [id number] [phone] phone [phone] fax

## 2017-04-30 LAB — COMPREHENSIVE METABOLIC PANEL
ALK PHOS: 71 IU/L (ref 39–117)
ALT: 44 IU/L (ref 0–44)
AST: 23 IU/L (ref 0–40)
Albumin/Globulin Ratio: 1.5 (ref 1.2–2.2)
Albumin: 4.8 g/dL (ref 3.5–5.5)
BILIRUBIN TOTAL: 0.2 mg/dL (ref 0.0–1.2)
BUN/Creatinine Ratio: 12 (ref 9–20)
BUN: 10 mg/dL (ref 6–20)
CHLORIDE: 104 mmol/L (ref 96–106)
CO2: 22 mmol/L (ref 20–29)
CREATININE: 0.81 mg/dL (ref 0.76–1.27)
Calcium: 9.8 mg/dL (ref 8.7–10.2)
GFR calc Af Amer: 134 mL/min/{1.73_m2} (ref >59)
GFR calc non Af Amer: 116 mL/min/{1.73_m2} (ref >59)
GLUCOSE: 124 mg/dL — AB (ref 65–99)
Globulin, Total: 3.2 g/dL (ref 1.5–4.5)
Potassium: 4.3 mmol/L (ref 3.5–5.2)
Sodium: 141 mmol/L (ref 134–144)
Total Protein: 8 g/dL (ref 6.0–8.5)

## 2017-04-30 LAB — LIPID PANEL
CHOL/HDL RATIO: 6.5 ratio — AB (ref 0.0–5.0)
Cholesterol, Total: 196 mg/dL (ref 100–199)
HDL: 30 mg/dL — ABNORMAL LOW (ref >39)
Triglycerides: 459 mg/dL — ABNORMAL HIGH (ref 0–149)

## 2017-04-30 LAB — CBC WITH DIFFERENTIAL/PLATELET
BASOS: 1 %
Basophils Absolute: 0 10*3/uL (ref 0.0–0.2)
EOS (ABSOLUTE): 0.2 10*3/uL (ref 0.0–0.4)
EOS: 2 %
HEMATOCRIT: 44.5 % (ref 37.5–51.0)
HEMOGLOBIN: 15.6 g/dL (ref 13.0–17.7)
Immature Grans (Abs): 0 10*3/uL (ref 0.0–0.1)
Immature Granulocytes: 0 %
LYMPHS ABS: 2.1 10*3/uL (ref 0.7–3.1)
Lymphs: 28 %
MCH: 30.2 pg (ref 26.6–33.0)
MCHC: 35.1 g/dL (ref 31.5–35.7)
MCV: 86 fL (ref 79–97)
MONOS ABS: 0.5 10*3/uL (ref 0.1–0.9)
Monocytes: 7 %
NEUTROS ABS: 4.8 10*3/uL (ref 1.4–7.0)
Neutrophils: 62 %
Platelets: 230 10*3/uL (ref 150–379)
RBC: 5.17 x10E6/uL (ref 4.14–5.80)
RDW: 13.9 % (ref 12.3–15.4)
WBC: 7.6 10*3/uL (ref 3.4–10.8)

## 2017-04-30 LAB — TSH: TSH: 1.14 u[IU]/mL (ref 0.450–4.500)

## 2017-05-02 ENCOUNTER — Encounter: Payer: Self-pay | Admitting: Family Medicine

## 2017-05-04 MED ORDER — GLUCOSE BLOOD VI STRP: ORAL_STRIP | 12 refills | Status: DC

## 2017-05-04 MED ORDER — FREESTYLE SYSTEM KIT: 1.0000 | PACK | 0 refills | Status: DC | PRN

## 2017-05-04 MED ORDER — LANCETS MISC: 11 refills | Status: DC

## 2017-05-13 ENCOUNTER — Encounter: Payer: Self-pay | Admitting: Family Medicine

## 2017-05-21 ENCOUNTER — Ambulatory Visit: Payer: Self-pay | Admitting: Registered"

## 2017-05-22 ENCOUNTER — Encounter: Payer: Managed Care, Other (non HMO) | Attending: Internal Medicine | Admitting: Registered"

## 2017-05-22 ENCOUNTER — Encounter: Payer: Self-pay | Admitting: Registered"

## 2017-05-22 DIAGNOSIS — E119 Type 2 diabetes mellitus without complications: Secondary | ICD-10-CM | POA: Diagnosis present

## 2017-05-22 DIAGNOSIS — Z713 Dietary counseling and surveillance: Secondary | ICD-10-CM | POA: Diagnosis present

## 2017-05-22 NOTE — Patient Instructions (Signed)
Plan:  Aim for about 4-5 Carb Choices per meal (60-75 grams)   Aim for 0-2 Carb Choices per snack if hungry (0-30 grams) Include protein with your meals and snacks Aim for 3 balanced meals per day Consider a balanced snack before bed to see if that helps you feel better in the morning and if it brings down some of those morning blood sugar numbers Consider reading food labels for Total Carbohydrate Consider increasing your activity level daily as tolerated, with a goal of 3-5 days per week. Consider checking blood sugar at alternate times per day until you feel your numbers are in a good range, then you can cut back to a few times per week and when you have low blood sugar symptoms.

## 2017-05-22 NOTE — Progress Notes (Signed)
Diabetes Self-Management Education  Visit Type: First/Initial  Appt. Start Time: 1030 Appt. End Time: 1200  05/22/2017  [name], identified by name and date of birth, is a 37 y.o. male with a diagnosis of Diabetes: Type 2.   ASSESSMENT Patient states has made some changes since getting the diagnosis, he doesn't eat candy like he used to, eats a lot less rice, and not eating pasta (loves it though), and eating a lot more veggies and learning to like them more.  Patent states he works in [employer] and feels good when he is active, reports energy level 9 out of 10.  Patient states the reason he went to the doctor is because is was feeling naseated and weak in the mornings. Patient states those symptoms have not completely resolved. RD discussed the potential somogyi effect and recommended a bedtime snack to see if that helps.   Diabetes Self-Management Education - 05/22/17 1039      Visit Information   Visit Type  First/Initial      Initial Visit   Diabetes Type  Type 2    Are you currently following a meal plan?  Yes    What type of meal plan do you follow?  low carb    Are you taking your medications as prescribed?  Not on Medications    Date Diagnosed  04/29/17      Health Coping   How would you rate your overall health?  Fair      Psychosocial Assessment   Patient Belief/Attitude about Diabetes  Motivated to manage diabetes    How often do you need to have someone help you when you read instructions, pamphlets, or other written materials from your doctor or pharmacy?  1 - Never    What is the last grade level you completed in school?  some college      Complications   Last HgB A1C per patient/outside source  7.1 % per patient    How often do you check your blood sugar?  1-2 times/day    Fasting Blood glucose range (mg/dL)  -- 16-10999-130    Postprandial Blood glucose range (mg/dL)  -- 60-45488-120    Number of hypoglycemic episodes per month  0    Number of hyperglycemic  episodes per week  0    Have you had a dilated eye exam in the past 12 months?  Yes    Have you had a dental exam in the past 12 months?  No    Are you checking your feet?  No      Dietary Intake   Breakfast  none if waking up late OR scrabble eggs, toast    Snack (morning)  cheese berries almonds, meat    Lunch  leftover    Snack (afternoon)  same as morning    Dinner  meat, brown rice, beans, vegetables    Snack (evening)  cereal, almond milk    Beverage(s)  milk, water      Exercise   Exercise Type  Light (walking / raking leaves) going to gym since sept    How many days per week to you exercise?  2    How many minutes per day do you exercise?  60    Total minutes per week of exercise  120      Patient Education   Previous Diabetes Education  No    Disease state   Definition of diabetes, type 1 and 2, and the diagnosis of  diabetes    Nutrition management   Role of diet in the treatment of diabetes and the relationship between the three main macronutrients and blood glucose level;Carbohydrate counting;Food label reading, portion sizes and measuring food.    Physical activity and exercise   Role of exercise on diabetes management, blood pressure control and cardiac health.    Monitoring  Identified appropriate SMBG and/or A1C goals.;Purpose and frequency of SMBG.    Acute complications  Taught treatment of hypoglycemia - the 15 rule.    Chronic complications  Relationship between chronic complications and blood glucose control;Lipid levels, blood glucose control and heart disease      Individualized Goals (developed by patient)   Nutrition  General guidelines for healthy choices and portions discussed    Physical Activity  Exercise 3-5 times per week    Monitoring   test my blood glucose as discussed      Outcomes   Expected Outcomes  Demonstrated interest in learning. Expect positive outcomes    Future DMSE  4-6 wks    Program Status  Completed     Individualized Plan for  Diabetes Self-Management Training:   Learning Objective:  Patient will have a greater understanding of diabetes self-management. Patient education plan is to attend individual and/or group sessions per assessed needs and concerns.  Patient Instructions  Plan:  Aim for about 4-5 Carb Choices per meal (60-75 grams)   Aim for 0-2 Carb Choices per snack if hungry (0-30 grams) Include protein with your meals and snacks Aim for 3 balanced meals per day Consider a balanced snack before bed to see if that helps you feel better in the morning and if it brings down some of those morning blood sugar numbers Consider reading food labels for Total Carbohydrate Consider increasing your activity level daily as tolerated, with a goal of 3-5 days per week. Consider checking blood sugar at alternate times per day until you feel your numbers are in a good range, then you can cut back to a few times per week and when you have low blood sugar symptoms.  Expected Outcomes:  Demonstrated interest in learning. Expect positive outcomes  Education material provided: Living Well with Diabetes, A1C conversion sheet, My Plate, Snack sheet and Carbohydrate counting sheet, Cereal options chart  If problems or questions, patient to contact team via:  Phone  Future DSME appointment: 4-6 wks

## 2017-05-24 ENCOUNTER — Encounter (HOSPITAL_COMMUNITY): Payer: Self-pay | Admitting: Emergency Medicine

## 2017-05-24 ENCOUNTER — Ambulatory Visit (HOSPITAL_COMMUNITY)
Admission: EM | Admit: 2017-05-24 | Discharge: 2017-05-24 | Disposition: A | Discharge status: Home / Self Care | Payer: Managed Care, Other (non HMO) | Attending: Family Medicine | Admitting: Family Medicine

## 2017-05-24 DIAGNOSIS — Z87891 Personal history of nicotine dependence: Secondary | ICD-10-CM | POA: Insufficient documentation

## 2017-05-24 DIAGNOSIS — R197 Diarrhea, unspecified: Secondary | ICD-10-CM | POA: Diagnosis not present

## 2017-05-24 DIAGNOSIS — Z800 Family history of malignant neoplasm of digestive organs: Secondary | ICD-10-CM | POA: Diagnosis not present

## 2017-05-24 DIAGNOSIS — Z947 Corneal transplant status: Secondary | ICD-10-CM | POA: Insufficient documentation

## 2017-05-24 DIAGNOSIS — Z8249 Family history of ischemic heart disease and other diseases of the circulatory system: Secondary | ICD-10-CM | POA: Diagnosis not present

## 2017-05-24 DIAGNOSIS — R112 Nausea with vomiting, unspecified: Secondary | ICD-10-CM | POA: Insufficient documentation

## 2017-05-24 DIAGNOSIS — E119 Type 2 diabetes mellitus without complications: Secondary | ICD-10-CM | POA: Diagnosis not present

## 2017-05-24 DIAGNOSIS — Z79899 Other long term (current) drug therapy: Secondary | ICD-10-CM | POA: Insufficient documentation

## 2017-05-24 DIAGNOSIS — J45909 Unspecified asthma, uncomplicated: Secondary | ICD-10-CM | POA: Diagnosis not present

## 2017-05-24 DIAGNOSIS — R109 Unspecified abdominal pain: Secondary | ICD-10-CM | POA: Insufficient documentation

## 2017-05-24 LAB — LIPASE, BLOOD: LIPASE: 39 U/L (ref 11–51)

## 2017-05-24 MED ORDER — ONDANSETRON 4 MG PO TBDP
4.0000 mg | ORAL_TABLET | Freq: Once | ORAL | Status: AC
  Administered 2017-05-24: 4 mg via ORAL

## 2017-05-24 MED ORDER — ONDANSETRON 4 MG PO TBDP
ORAL_TABLET | ORAL | Status: AC
  Filled 2017-05-24: qty 1

## 2017-05-24 MED ORDER — DICYCLOMINE HCL 20 MG PO TABS: 20.0000 mg | ORAL_TABLET | Freq: Three times a day (TID) | ORAL | 0 refills | Status: DC

## 2017-05-24 MED ORDER — ONDANSETRON HCL 4 MG PO TABS: 4.0000 mg | ORAL_TABLET | Freq: Four times a day (QID) | ORAL | 0 refills | Status: AC

## 2017-05-24 NOTE — Discharge Instructions (Signed)
Your nausea, vomiting, and diarrhea appear to have a viral cause, which will slowly resolve on its own, but monitor symptoms as I would expect abdominal pain to improve with control of nausea/vomiting and diarrhea.  Your symptoms should improve over the next week as your body continues to rid the infectious cause. We will call if lipase abnormal- this is a pancreas marker.  Is try to take the Bentyl to help with your spasms.  If Bentyl is not helping or if pain is worsening, developing increased dizziness and lightheadedness please go to the emergency room immediately.   For nausea: Zofran prescribed. Begin with every 6 hours, than as you are able to hold food down, take it as needed. Start with clear liquids, then move to plain foods like bananas, rice, applesauce, toast, broth, grits, oatmeal. As those food settle okay you may transition to your normal foods. Avoid spicy and greasy foods as much as possible.  For Diarrhea: This is your body's natural way of getting rid of a virus. Bowels should return to normal as you get over this.   Preventing dehydration is key! You need to replace the fluid your body is expelling. Drink plenty of fluids, may use Pedialyte or sports drinks.   Please return if you are experiencing blood in your vomit or stool or experiencing increased dizziness, lightheadedness, extreme fatigue, increased abdominal pain, unable to tolerate oral intake with Zofran.

## 2017-05-24 NOTE — ED Triage Notes (Signed)
PT C/O: abd pain onset 1400 associated w/nausea, vomiting, loose stools   DENIES: fevers  TAKING MEDS: Pepto bismol   A&O x4... NAD... Ambulatory

## 2017-05-24 NOTE — ED Provider Notes (Signed)
[name]    CSN: [id number] Arrival date & time: 05/24/17  1926     History   Chief Complaint Chief Complaint  Patient presents with  . Abdominal Pain    HPI Alexander Osborne is a 37 y.o. male with recent diagnosis of diabetes presenting today with acute onset of abdominal pain, nausea, vomiting, around 2 PM he began to feel nauseous and thought he was just hungry.  His abdominal pain continued to worsen since then.  He had some dry heaving, and one episode of vomit.  He did state that there is some small specks of blood in the vomit but this is not for him because he is a "violent vomiter".  Denies fevers.  He states he does not drink alcohol heavily consistently, but did drink a lot last night.  He states he always gets concerned when he has any abdominal pain because his mother passed away at age 77 due to pancreatic cancer.   HPI  Past Medical History:  Diagnosis Date  . Allergy   . Asthma     Patient Active Problem List   Diagnosis Date Noted  . Newly diagnosed diabetes ([place]) 05/22/2017  . Extrinsic asthma 02/07/2014    Past Surgical History:  Procedure Laterality Date  . corneal transplant left    . EYE SURGERY         Home Medications    Prior to Admission medications   Medication Sig Start Date End Date Taking? Authorizing Provider  albuterol (PROVENTIL HFA;VENTOLIN HFA) 108 (90 Base) MCG/ACT inhaler Inhale 2 puffs into the lungs every 4 (four) hours as needed for wheezing or shortness of breath (cough, shortness of breath or wheezing.). 05/06/15  Yes Roselee Culver, MD  Triamcinolone Acetonide (NASACORT ALLERGY 24HR NA) Place into the nose.   Yes [provider]  ALBUTEROL IN Inhale into the lungs. Reported on 09/18/2015    [provider]  dicyclomine (BENTYL) 20 MG tablet Take 1 tablet (20 mg total) by mouth 4 (four) times daily -  before meals and at bedtime for 7 days. 05/24/17 05/31/17  Wieters, Hallie C, PA-C  glucose  blood test strip Check sugar once daily  Dx: DMII controlled, non-insulin, without complications 08/21/75   Wardell Honour, MD  glucose monitoring kit (FREESTYLE) monitoring kit 1 each by Does not apply route as needed for other. Dx: DMII non-insulin dependent without complications; controlled; 05/04/17   Wardell Honour, MD  Lancets MISC Check sugar once daily dx DMII controlled, non-insulin, without complications 07/31/85   Wardell Honour, MD  Loratadine (CLARITIN PO) Take by mouth. Reported on 09/18/2015    [provider]  ondansetron (ZOFRAN) 4 MG tablet Take 1 tablet (4 mg total) by mouth every 6 (six) hours for 5 days. 05/24/17 05/29/17  Wieters, Elesa Hacker, PA-C    Family History Family History  Problem Relation Age of Onset  . Cancer Mother 66       pancreatic cancer  . Hypertension Father   . Heart disease Father 21       CAD  . Bipolar disorder Brother   . Diabetes Sister     Social History Social History   Tobacco Use  . Smoking status: Former Research scientist (life sciences)  . Smokeless tobacco: Never Used  . Tobacco comment: Quit 02/2015  Substance Use Topics  . Alcohol use: Yes    Alcohol/week: 0.6 oz    Types: 1 Standard drinks or equivalent per week  . Drug  use: No     Allergies   Patient has no known allergies.   Review of Systems Review of Systems  Constitutional: Negative for fatigue and fever.  Eyes: Negative for visual disturbance.  Respiratory: Negative for shortness of breath.   Cardiovascular: Negative for chest pain.  Gastrointestinal: Positive for abdominal pain, diarrhea, nausea and vomiting. Negative for blood in stool.  Musculoskeletal: Negative for myalgias.  Neurological: Positive for dizziness. Negative for weakness, light-headedness and headaches.     Physical Exam Triage Vital Signs ED Triage Vitals  Enc Vitals Group     BP 05/24/17 2001 138/80     Pulse Rate 05/24/17 2001 88     Resp 05/24/17 2001 16     Temp 05/24/17 2001 98.5 F (36.9 C)      Temp Source 05/24/17 2001 Oral     SpO2 05/24/17 2001 97 %     Weight --      Height --      Head Circumference --      Peak Flow --      Pain Score 05/24/17 2002 6     Pain Loc --      Pain Edu? --      Excl. in [place]? --    No data found.  Updated Vital Signs BP 138/80 (BP Location: Left Arm)   Pulse 88   Temp 98.5 F (36.9 C) (Oral)   Resp 16   SpO2 97%    Physical Exam  Constitutional: He is oriented to person, place, and time. He appears well-developed and well-nourished.  HENT:  Head: Normocephalic and atraumatic.  Mouth/Throat: Oropharynx is clear and moist.  Eyes: Conjunctivae and EOM are normal. Pupils are equal, round, and reactive to light.  Neck: Neck supple.  Cardiovascular: Normal rate and regular rhythm.  No murmur heard. Pulmonary/Chest: Effort normal and breath sounds normal. No respiratory distress.  CTABL, no adventitious sounds appreciated  Abdominal: Soft. There is no tenderness.  Bowel sounds present throughout abdomen, although sounds slightly hypoactive.  Tenderness to palpation of epigastrium.  Abdomen is soft, no guarding on exam.  Negative Murphy's, negative McBurney's, no rebound.  Musculoskeletal: He exhibits no edema.  Neurological: He is alert and oriented to person, place, and time.  Patient is alert and oriented, cranial nerves II through XII grossly intact.  Skin: Skin is warm and dry.  Psychiatric: He has a normal mood and affect.  Nursing note and vitals reviewed.    UC Treatments / Results  Labs (all labs ordered are listed, but only abnormal results are displayed) Labs Reviewed  LIPASE, BLOOD    EKG  EKG Interpretation None       Radiology No results found.  Procedures Procedures (including critical care time)  Medications Ordered in UC Medications  ondansetron (ZOFRAN-ODT) disintegrating tablet 4 mg (4 mg Oral Given 05/24/17 2024)     Initial Impression / Assessment and Plan / UC Course  I have reviewed the  triage vital signs and the nursing notes.  Pertinent labs & imaging results that were available during my care of the patient were reviewed by me and considered in my medical decision making (see chart for details).      Symptoms seem most likely related to viral gastroenteritis versus alcohol induced gastritis. Lipase ordered.  Provided Zofran with p.o. Challenge.  Patient ate some crackers and some water.  After approximately 10-15 main minutes he began to have a spasm in his abdomen.  And sensation that he was  going to throw up and needed to use the restroom.  He did not vomit, but he did have an episode of diarrhea. Bowel movement was watery and appeared dark, did not appear bloody/tarry.  He said this is his first dark stool since this started.  Earlier visit patient requested something for his abdominal pain, advised he may try Bentyl. Abdominal exam did not appear acute, but the patient is with dizziness, and symptoms did not improve as expected with Zofran.  Patient is stable at this time, may go home briefly to see if symptoms improving with bentyl.  Patient told that he may go home and try the Bentyl and see if he has any improvement in his abdominal pain. Advised if he does not have any improvement in the next 1-2 hours after taking he should go to the emergency room.  Patient and husband agreeable to plan, will have a low threshold to return to emergency room if symptoms persisting or worsening.    Final Clinical Impressions(s) / UC Diagnoses   Final diagnoses:  Nausea vomiting and diarrhea    ED Discharge Orders        Ordered    ondansetron (ZOFRAN) 4 MG tablet  Every 6 hours     05/24/17 2035    dicyclomine (BENTYL) 20 MG tablet  3 times daily before meals & bedtime     05/24/17 2035       Controlled Substance Prescriptions [place] Controlled Substance Registry consulted? Not Applicable   Janith Lima, [place] 05/24/17 2115

## 2017-05-25 ENCOUNTER — Ambulatory Visit: Payer: Managed Care, Other (non HMO) | Admitting: Emergency Medicine

## 2017-05-25 ENCOUNTER — Encounter: Payer: Self-pay | Admitting: Emergency Medicine

## 2017-05-25 ENCOUNTER — Other Ambulatory Visit: Payer: Self-pay

## 2017-05-25 VITALS — BP 102/68 | HR 105 | Temp 98.4°F | Resp 16 | Ht 69.5 in | Wt 218.0 lb

## 2017-05-25 DIAGNOSIS — R112 Nausea with vomiting, unspecified: Secondary | ICD-10-CM

## 2017-05-25 DIAGNOSIS — R1013 Epigastric pain: Secondary | ICD-10-CM | POA: Diagnosis not present

## 2017-05-25 LAB — POCT CBC
Granulocyte percent: 73 %G (ref 37–80)
HEMATOCRIT: 45.5 % (ref 43.5–53.7)
HEMOGLOBIN: 16 g/dL (ref 14.1–18.1)
LYMPH, POC: 1.3 (ref 0.6–3.4)
MCH, POC: 30.3 pg (ref 27–31.2)
MCHC: 35.1 g/dL (ref 31.8–35.4)
MCV: 86.1 fL (ref 80–97)
MID (cbc): 0.2 (ref 0–0.9)
MPV: 8.8 fL (ref 0–99.8)
POC GRANULOCYTE: 4 (ref 2–6.9)
POC LYMPH %: 23.9 % (ref 10–50)
POC MID %: 3.1 % (ref 0–12)
Platelet Count, POC: 211 10*3/uL (ref 142–424)
RBC: 5.28 M/uL (ref 4.69–6.13)
RDW, POC: 13.6 %
WBC: 5.5 10*3/uL (ref 4.6–10.2)

## 2017-05-25 NOTE — Patient Instructions (Addendum)
Continue Zofran.  Clear liquid diet for 24 hours.  Return if worse.   IF you received an x-ray today, you will receive an invoice from [facility] Radiology. Please contact [facility] Radiology at [phone] with questions or concerns regarding your invoice.   IF you received labwork today, you will receive an invoice from [facility]. Please contact [facility] at [phone] with questions or concerns regarding your invoice.   Our billing staff will not be able to assist you with questions regarding bills from these companies.  You will be contacted with the lab results as soon as they are available. The fastest way to get your results is to activate your My Chart account. Instructions are located on the last page of this paperwork. If you have not heard from [place] regarding the results in 2 weeks, please contact this office.     Vomiting, Adult Vomiting occurs when stomach contents are thrown up and out of the mouth. Many people notice nausea before vomiting. Vomiting can make you feel weak and dehydrated. Dehydration can make you tired and thirsty, cause you to have a dry mouth, and decrease how often you urinate. Older adults and people who have other diseases or a weak immune system are at higher risk for dehydration.It is important to treat vomiting as told by your health care provider. Follow these instructions at home: Follow your health care provider's instructions about how to care for yourself at home. Eating and drinking Follow these recommendations as told by your health care provider:  Take an oral rehydration solution (ORS). This is a drink that is sold at pharmacies and retail stores.  Eat bland, easy-to-digest foods in small amounts as you are able. These foods include bananas, applesauce, rice, lean meats, toast, and crackers.  Drink clear fluids in small amounts as you are able. Clear fluids include water, ice chips, low-calorie sports drinks, and fruit juice that has water  added (diluted fruit juice).  Avoid fluids that contain a lot of sugar or caffeine.  Avoid alcohol and foods that are spicy or fatty.  General instructions   Wash your hands frequently with soap and water. If soap and water are not available, use hand sanitizer. Make sure that everyone in your household washes their hands frequently.  Take over-the-counter and prescription medicines only as told by your health care provider.  Watch your condition for any changes.  Keep all follow-up visits as told by your health care provider. This is important. Contact a health care provider if:  You have a fever.  You are not able to keep fluids down.  Your vomiting gets worse.  You have new symptoms.  You feel light-headed or dizzy.  You have a headache.  You have muscle cramps. Get help right away if:  You have pain in your chest, neck, arm, or jaw.  You feel extremely weak or you faint.  You have persistent vomiting.  You have vomit that is bright red or looks like black coffee grounds.  You have stools that are bloody or black, or stools that look like tar.  You have severe pain, cramping, or bloating in your abdomen.  You have a severe headache, a stiff neck, or both.  You have a rash.  You have trouble breathing or you are breathing very quickly.  Your heart is beating very quickly.  Your skin feels cold and clammy.  You feel confused.  You have pain while urinating.  You have signs of dehydration, such as: ? Dark urine,  or very little or no urine. ? Cracked lips. ? Dry mouth. ? Sunken eyes. ? Sleepiness. ? Weakness. These symptoms may represent a serious problem that is an emergency. Do not wait to see if the symptoms will go away. Get medical help right away. Call your local emergency services (911 in the [place] Do not drive yourself to the hospital. This information is not intended to replace advice given to you by your health care provider. Make sure you  discuss any questions you have with your health care provider. Document Released: 05/04/2015 Document Revised: 09/13/2015 Document Reviewed: 12/12/2014 Elsevier Interactive Patient Education  [employer]2018 [employer]

## 2017-05-25 NOTE — Progress Notes (Signed)
[place] 37 y.o.   Chief Complaint  Patient presents with  . Diarrhea    with vomiting - per patient seen at [facility] 05/24/17  . Depression    total 7 points on scale    HISTORY OF PRESENT ILLNESS: This is a 37 y.o. male complaining of epigastric burning sensation that started yesterday at 2 in the afternoon; had lunch and is felt worse; pain 6 out of 10; developed diarrhea last night.  Took Pepto-Bismol in the afternoon and today stools are black.  He went to the urgent care center yesterday; lipase test normal; recent history of high triglycerides with slightly elevated glucose and Hemoglobin A1c of 7.1.  No medications.  Given Zofran at the urgent care center yesterday.  Still symptomatic today but feels a little better.  No new symptoms.  HPI   Prior to Admission medications   Medication Sig Start Date End Date Taking? Authorizing Provider  albuterol (PROVENTIL HFA;VENTOLIN HFA) 108 (90 Base) MCG/ACT inhaler Inhale 2 puffs into the lungs every 4 (four) hours as needed for wheezing or shortness of breath (cough, shortness of breath or wheezing.). 05/06/15  Yes Roselee Culver, MD  ALBUTEROL IN Inhale into the lungs. Reported on 09/18/2015   Yes [provider]  Loratadine (CLARITIN PO) Take by mouth. Reported on 09/18/2015   Yes [provider]  ondansetron (ZOFRAN) 4 MG tablet Take 1 tablet (4 mg total) by mouth every 6 (six) hours for 5 days. 05/24/17 05/29/17 Yes Wieters, Hallie C, PA-C  Triamcinolone Acetonide (NASACORT ALLERGY 24HR NA) Place into the nose.   Yes [provider]  dicyclomine (BENTYL) 20 MG tablet Take 1 tablet (20 mg total) by mouth 4 (four) times daily -  before meals and at bedtime for 7 days. Patient not taking: Reported on 05/25/2017 05/24/17 05/31/17  Debara Pickett C, PA-C  glucose blood test strip Check sugar once daily  Dx: DMII controlled, non-insulin, without complications 04/22/70   Wardell Honour, MD  glucose  monitoring kit (FREESTYLE) monitoring kit 1 each by Does not apply route as needed for other. Dx: DMII non-insulin dependent without complications; controlled; 05/04/17   Wardell Honour, MD  Lancets MISC Check sugar once daily dx DMII controlled, non-insulin, without complications 5/36/64   Wardell Honour, MD    No Known Allergies  Patient Active Problem List   Diagnosis Date Noted  . Newly diagnosed diabetes ([place]) 05/22/2017  . Extrinsic asthma 02/07/2014    Past Medical History:  Diagnosis Date  . Allergy   . Asthma     Past Surgical History:  Procedure Laterality Date  . corneal transplant left    . EYE SURGERY      Social History   Socioeconomic History  . Marital status: Married    Spouse name: Not on file  . Number of children: 0  . Years of education: Not on file  . Highest education level: Not on file  Social Needs  . Financial resource strain: Not on file  . Food insecurity - worry: Not on file  . Food insecurity - inability: Not on file  . Transportation needs - medical: Not on file  . Transportation needs - non-medical: Not on file  Occupational History  . 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 Smoking status: Former Research scientist (life sciences)  . Smokeless tobacco: Never Used  . Tobacco comment: Quit 02/2015  Substance and Sexual Activity  . Alcohol  use: Yes    Alcohol/week: 0.6 oz    Types: 1 Standard drinks or equivalent per week  . Drug use: No  . Sexual activity: Yes    Partners: Male    Birth control/protection: None    Comment: SS partner  Other Topics Concern  . Not on file  Social History Narrative   Marital status: married x 4 years; happily married.       Lives; with husbnad      Employment: Librarian, academic at [employer]: socially; weekends      Alcohol: 1-3 drinks per week      Drugs: none      Exercise: none   Denies caffeine use     Family History  Problem Relation Age of Onset  . Cancer Mother 59         pancreatic cancer  . Hypertension Father   . Heart disease Father 7       CAD  . Bipolar disorder Brother   . Diabetes Sister      Review of Systems  Constitutional: Negative for chills and fever.  HENT: Negative.   Eyes: Negative.   Respiratory: Negative.  Negative for cough and shortness of breath.   Cardiovascular: Negative.  Negative for chest pain and palpitations.  Gastrointestinal: Positive for abdominal pain, diarrhea, nausea and vomiting. Negative for blood in stool.  Genitourinary: Negative.  Negative for hematuria.  Musculoskeletal: Negative for myalgias.  Skin: Negative.  Negative for rash.  Neurological: Negative.  Negative for dizziness and headaches.  Endo/Heme/Allergies: Negative.   All other systems reviewed and are negative.   Vitals:   05/25/17 1154  BP: 102/68  Pulse: (!) 105  Resp: 16  Temp: 98.4 F (36.9 C)  SpO2: 96%    Physical Exam  Constitutional: He is oriented to person, place, and time. He appears well-developed and well-nourished.  HENT:  Head: Normocephalic and atraumatic.  Nose: Nose normal.  Mouth/Throat: Oropharynx is clear and moist.  Eyes: Conjunctivae and EOM are normal. Pupils are equal, round, and reactive to light.  Neck: Normal range of motion. Neck supple. No JVD present. No thyromegaly present.  Cardiovascular: Normal rate, regular rhythm and normal heart sounds.  Pulmonary/Chest: Effort normal and breath sounds normal.  Abdominal: Soft. Bowel sounds are normal. He exhibits no distension. There is tenderness (mild epigastric tenderness).  Musculoskeletal: Normal range of motion. He exhibits no edema or tenderness.  Lymphadenopathy:    He has no cervical adenopathy.  Neurological: He is alert and oriented to person, place, and time. No sensory deficit. He exhibits normal muscle tone.  Skin: Skin is warm and dry. Capillary refill takes less than 2 seconds. No rash noted.  Psychiatric: He has a normal mood and affect.  His behavior is normal.  Vitals reviewed.  Results for orders placed or performed in visit on 05/25/17 (from the past 24 hour(s))  POCT CBC     Status: None   Collection Time: 05/25/17 12:41 PM  Result Value Ref Range   WBC 5.5 4.6 - 10.2 K/uL   Lymph, poc 1.3 0.6 - 3.4   POC LYMPH PERCENT 23.9 10 - 50 %L   MID (cbc) 0.2 0 - 0.9   POC MID % 3.1 0 - 12 %M   POC Granulocyte 4.0 2 - 6.9   Granulocyte percent 73.0 37 - 80 %G   RBC 5.28 4.69 - 6.13 M/uL   Hemoglobin 16.0 14.1 - 18.1 g/dL  HCT, POC 45.5 43.5 - 53.7 %   MCV 86.1 80 - 97 fL   MCH, POC 30.3 27 - 31.2 pg   MCHC 35.1 31.8 - 35.4 g/dL   RDW, POC 13.6 %   Platelet Count, POC 211 142 - 424 K/uL   MPV 8.8 0 - 99.8 fL     ASSESSMENT & PLAN: Alexander Osborne was seen today for diarrhea and depression.  Diagnoses and all orders for this visit:  Non-intractable vomiting with nausea, unspecified vomiting type -     Comprehensive metabolic panel -     POCT CBC -     Lipase  Abdominal pain, epigastric -     Comprehensive metabolic panel -     POCT CBC -     Lipase    Patient Instructions    Continue Zofran.  Clear liquid diet for 24 hours.  Return if worse.   IF you received an x-ray today, you will receive an invoice from [facility]. Please contact [facility] at [phone] with questions or concerns regarding your invoice.   IF you received labwork today, you will receive an invoice from [facility]. Please contact [facility] at [phone] with questions or concerns regarding your invoice.   Our billing staff will not be able to assist you with questions regarding bills from these companies.  You will be contacted with the lab results as soon as they are available. The fastest way to get your results is to activate your My Chart account. Instructions are located on the last page of this paperwork. If you have not heard from [place] regarding the results in 2 weeks, please contact this office.     Vomiting,  Adult Vomiting occurs when stomach contents are thrown up and out of the mouth. Many people notice nausea before vomiting. Vomiting can make you feel weak and dehydrated. Dehydration can make you tired and thirsty, cause you to have a dry mouth, and decrease how often you urinate. Older adults and people who have other diseases or a weak immune system are at higher risk for dehydration.It is important to treat vomiting as told by your health care provider. Follow these instructions at home: Follow your health care provider's instructions about how to care for yourself at home. Eating and drinking Follow these recommendations as told by your health care provider:  Take an oral rehydration solution (ORS). This is a drink that is sold at pharmacies and retail stores.  Eat bland, easy-to-digest foods in small amounts as you are able. These foods include bananas, applesauce, rice, lean meats, toast, and crackers.  Drink clear fluids in small amounts as you are able. Clear fluids include water, ice chips, low-calorie sports drinks, and fruit juice that has water added (diluted fruit juice).  Avoid fluids that contain a lot of sugar or caffeine.  Avoid alcohol and foods that are spicy or fatty.  General instructions   Wash your hands frequently with soap and water. If soap and water are not available, use hand sanitizer. Make sure that everyone in your household washes their hands frequently.  Take over-the-counter and prescription medicines only as told by your health care provider.  Watch your condition for any changes.  Keep all follow-up visits as told by your health care provider. This is important. Contact a health care provider if:  You have a fever.  You are not able to keep fluids down.  Your vomiting gets worse.  You have new symptoms.  You feel light-headed or dizzy.  You have a headache.  You have muscle cramps. Get help right away if:  You have pain in your chest,  neck, arm, or jaw.  You feel extremely weak or you faint.  You have persistent vomiting.  You have vomit that is bright red or looks like black coffee grounds.  You have stools that are bloody or black, or stools that look like tar.  You have severe pain, cramping, or bloating in your abdomen.  You have a severe headache, a stiff neck, or both.  You have a rash.  You have trouble breathing or you are breathing very quickly.  Your heart is beating very quickly.  Your skin feels cold and clammy.  You feel confused.  You have pain while urinating.  You have signs of dehydration, such as: ? Dark urine, or very little or no urine. ? Cracked lips. ? Dry mouth. ? Sunken eyes. ? Sleepiness. ? Weakness. These symptoms may represent a serious problem that is an emergency. Do not wait to see if the symptoms will go away. Get medical help right away. Call your local emergency services (911 in the [place] Do not drive yourself to the hospital. This information is not intended to replace advice given to you by your health care provider. Make sure you discuss any questions you have with your health care provider. Document Released: 05/04/2015 Document Revised: 09/13/2015 Document Reviewed: 12/12/2014 Elsevier Interactive Patient Education  2018 Elsevier Inc.      Agustina Caroli, MD [facility]

## 2017-05-26 ENCOUNTER — Encounter: Payer: Self-pay | Admitting: Radiology

## 2017-05-26 LAB — COMPREHENSIVE METABOLIC PANEL
A/G RATIO: 1.7 (ref 1.2–2.2)
ALT: 34 IU/L (ref 0–44)
AST: 28 IU/L (ref 0–40)
Albumin: 4.8 g/dL (ref 3.5–5.5)
Alkaline Phosphatase: 71 IU/L (ref 39–117)
BILIRUBIN TOTAL: 0.6 mg/dL (ref 0.0–1.2)
BUN/Creatinine Ratio: 15 (ref 9–20)
BUN: 13 mg/dL (ref 6–20)
CHLORIDE: 103 mmol/L (ref 96–106)
CO2: 19 mmol/L — ABNORMAL LOW (ref 20–29)
Calcium: 9.3 mg/dL (ref 8.7–10.2)
Creatinine, Ser: 0.87 mg/dL (ref 0.76–1.27)
GFR calc Af Amer: 130 mL/min/{1.73_m2} (ref >59)
GFR calc non Af Amer: 113 mL/min/{1.73_m2} (ref >59)
Globulin, Total: 2.9 g/dL (ref 1.5–4.5)
Glucose: 110 mg/dL — ABNORMAL HIGH (ref 65–99)
Potassium: 4.1 mmol/L (ref 3.5–5.2)
SODIUM: 139 mmol/L (ref 134–144)
Total Protein: 7.7 g/dL (ref 6.0–8.5)

## 2017-05-28 ENCOUNTER — Encounter: Payer: Self-pay | Admitting: Family Medicine

## 2017-05-28 ENCOUNTER — Ambulatory Visit (INDEPENDENT_AMBULATORY_CARE_PROVIDER_SITE_OTHER): Payer: Managed Care, Other (non HMO) | Admitting: Family Medicine

## 2017-05-28 ENCOUNTER — Ambulatory Visit: Payer: Self-pay | Admitting: Family Medicine

## 2017-05-28 ENCOUNTER — Other Ambulatory Visit: Payer: Self-pay

## 2017-05-28 VITALS — BP 122/84 | HR 94 | Temp 97.7°F | Ht 69.69 in | Wt 125.8 lb

## 2017-05-28 DIAGNOSIS — Z202 Contact with and (suspected) exposure to infections with a predominantly sexual mode of transmission: Secondary | ICD-10-CM | POA: Diagnosis not present

## 2017-05-28 MED ORDER — CEFTRIAXONE SODIUM 250 MG IJ SOLR
250.0000 mg | Freq: Once | INTRAMUSCULAR | Status: AC
  Administered 2017-05-28: 250 mg via INTRAMUSCULAR

## 2017-05-28 MED ORDER — AZITHROMYCIN 250 MG PO TABS
ORAL_TABLET | ORAL | 0 refills | Status: DC
Start: 2017-05-28 — End: 2017-06-29

## 2017-05-28 NOTE — Progress Notes (Signed)
2/7/201912:03 PM  [place] 04/08/1981, 37 y.o. male [id number]  Chief Complaint  Patient presents with  . Follow-up    STI TESTING NEEDED AND REQUESTING LAB WORK    HPI:   Patient is a 37 y.o. male who presents today requesting STD testing.   Possible exposure to gonorrhea Had unprotected sex with someone that had sex with another person that tested positive for gonorrhea Partner is also being tested today Mild sore throat, but having a cold No GU symptoms Denies h/o previous STD  Depression screen [facility] 2/9 05/28/2017 05/25/2017 05/22/2017  Decreased Interest 0 0 1  Down, Depressed, Hopeless 0 2 1  PHQ - 2 Score 0 2 2  Altered sleeping - 1 -  Tired, decreased energy - 1 -  Change in appetite - 1 -  Feeling bad or failure about yourself  - 1 -  Trouble concentrating - 0 -  Moving slowly or fidgety/restless - 0 -  Suicidal thoughts - 1 -  PHQ-9 Score - 7 -    No Known Allergies  Prior to Admission medications   Medication Sig Start Date End Date Taking? Authorizing Provider  albuterol (PROVENTIL HFA;VENTOLIN HFA) 108 (90 Base) MCG/ACT inhaler Inhale 2 puffs into the lungs every 4 (four) hours as needed for wheezing or shortness of breath (cough, shortness of breath or wheezing.). 05/06/15  Yes Roselee Culver, MD  ALBUTEROL IN Inhale into the lungs. Reported on 09/18/2015   Yes [provider]  glucose blood test strip Check sugar once daily  Dx: DMII controlled, non-insulin, without complications 07/04/38  Yes Wardell Honour, MD  glucose monitoring kit (FREESTYLE) monitoring kit 1 each by Does not apply route as needed for other. Dx: DMII non-insulin dependent without complications; controlled; 05/04/17  Yes Wardell Honour, MD  Lancets MISC Check sugar once daily dx DMII controlled, non-insulin, without complications 0/86/76  Yes Wardell Honour, MD  Loratadine (CLARITIN PO) Take by mouth. Reported on 09/18/2015   Yes [provider]  ondansetron  (ZOFRAN) 4 MG tablet Take 1 tablet (4 mg total) by mouth every 6 (six) hours for 5 days. 05/24/17 05/29/17 Yes Wieters, Hallie C, PA-C  Triamcinolone Acetonide (NASACORT ALLERGY 24HR NA) Place into the nose.   Yes [provider]    Past Medical History:  Diagnosis Date  . Allergy   . Asthma     Past Surgical History:  Procedure Laterality Date  . corneal transplant left    . EYE SURGERY      Social History   Tobacco Use  . Smoking status: Former Research scientist (life sciences)  . Smokeless tobacco: Never Used  . Tobacco comment: Quit 02/2015  Substance Use Topics  . Alcohol use: Yes    Alcohol/week: 0.6 oz    Types: 1 Standard drinks or equivalent per week    Family History  Problem Relation Age of Onset  . Cancer Mother 66       pancreatic cancer  . Hypertension Father   . Heart disease Father 56       CAD  . Bipolar disorder Brother   . Diabetes Sister     ROS Per hpi  OBJECTIVE:  Blood pressure 122/84, pulse 94, temperature 97.7 F (36.5 C), temperature source Oral, height 5' 9.69" (1.77 m), weight 125 lb 12.8 oz (57.1 kg), SpO2 100 %.  Physical Exam  Constitutional: He is oriented to person, place, and time and well-developed, well-nourished, and in no distress.  HENT:  Head: Normocephalic and atraumatic.  Mouth/Throat: Oropharynx is clear and moist. No oropharyngeal exudate.  Eyes: EOM are normal. Pupils are equal, round, and reactive to light.  Neck: Neck supple.  Cardiovascular: Normal rate and regular rhythm. Exam reveals no gallop and no friction rub.  No murmur heard. Pulmonary/Chest: Effort normal and breath sounds normal. He has no wheezes. He has no rales.  Neurological: He is alert and oriented to person, place, and time. Gait normal.  Skin: Skin is warm and dry.     ASSESSMENT and PLAN  1. Exposure to STD Treating empirically given possible exposure, no sexual activity for the next week. Discussed importance of safe sex and condom use.  - GC/Chlamydia  Probe Amp([facility]) - HIV antibody - RPR - Trichomonas vaginalis, RNA - cefTRIAXone (ROCEPHIN) injection 250 mg  Other orders - azithromycin (ZITHROMAX) 250 MG tablet; Take all 4 tabs by mouth once  Return if symptoms worsen or fail to improve.    Rutherford Guys, MD [facility] at [place], [place] [address] Ph.  [phone] Fax [phone]

## 2017-05-28 NOTE — Patient Instructions (Signed)
     IF you received an x-ray today, you will receive an invoice from [facility]. Please contact [facility] at [phone] with questions or concerns regarding your invoice.   IF you received labwork today, you will receive an invoice from [facility]. Please contact [facility] at [phone] with questions or concerns regarding your invoice.   Our billing staff will not be able to assist you with questions regarding bills from these companies.  You will be contacted with the lab results as soon as they are available. The fastest way to get your results is to activate your My Chart account. Instructions are located on the last page of this paperwork. If you have not heard from us regarding the results in 2 weeks, please contact this office.     

## 2017-05-29 LAB — RPR: RPR Ser Ql: NONREACTIVE

## 2017-05-29 LAB — HIV ANTIBODY (ROUTINE TESTING W REFLEX): HIV Screen 4th Generation wRfx: NONREACTIVE

## 2017-06-02 ENCOUNTER — Telehealth: Payer: Self-pay

## 2017-06-02 NOTE — Telephone Encounter (Signed)
Copied from CRM [id number]. Topic: General - Other >> Jun 02, 2017 12:09 PM Darletta MollLander, Lumin L wrote: Reason for CRM: Patient would like a call back about his labs once final results are in for all of them. He viewed only 2 in Mychart b/c the others have not been released yet.

## 2017-06-04 ENCOUNTER — Encounter: Payer: Self-pay | Admitting: Family Medicine

## 2017-06-04 NOTE — Telephone Encounter (Signed)
Pt req call back on final results of labs.  Unable to see in MyChart. Sent to Dr. Leretha PolSantiago

## 2017-06-04 NOTE — Telephone Encounter (Signed)
Can you please investigate what happened? It seem that his urine was never sent, it was collected. He has been treated empirically for chlamydia and gonorrhea. But he would have liked to know. thanks

## 2017-06-10 LAB — HM DIABETES EYE EXAM

## 2017-06-29 ENCOUNTER — Ambulatory Visit (INDEPENDENT_AMBULATORY_CARE_PROVIDER_SITE_OTHER): Payer: Managed Care, Other (non HMO) | Admitting: Allergy and Immunology

## 2017-06-29 ENCOUNTER — Ambulatory Visit: Payer: Managed Care, Other (non HMO) | Admitting: Registered"

## 2017-06-29 ENCOUNTER — Encounter: Payer: Self-pay | Admitting: Allergy and Immunology

## 2017-06-29 VITALS — BP 110/60 | HR 64 | Temp 97.8°F | Resp 16 | Ht 69.1 in | Wt 210.2 lb

## 2017-06-29 DIAGNOSIS — J3089 Other allergic rhinitis: Secondary | ICD-10-CM | POA: Diagnosis not present

## 2017-06-29 DIAGNOSIS — T7800XA Anaphylactic reaction due to unspecified food, initial encounter: Secondary | ICD-10-CM | POA: Insufficient documentation

## 2017-06-29 DIAGNOSIS — J4520 Mild intermittent asthma, uncomplicated: Secondary | ICD-10-CM | POA: Diagnosis not present

## 2017-06-29 DIAGNOSIS — L299 Pruritus, unspecified: Secondary | ICD-10-CM | POA: Insufficient documentation

## 2017-06-29 DIAGNOSIS — T7800XD Anaphylactic reaction due to unspecified food, subsequent encounter: Secondary | ICD-10-CM

## 2017-06-29 MED ORDER — LEVOCETIRIZINE DIHYDROCHLORIDE 5 MG PO TABS: 5.0000 mg | ORAL_TABLET | Freq: Every evening | ORAL | 5 refills | Status: DC

## 2017-06-29 MED ORDER — ALBUTEROL SULFATE HFA 108 (90 BASE) MCG/ACT IN AERS: 2.0000 | INHALATION_SPRAY | RESPIRATORY_TRACT | 12 refills | Status: DC | PRN

## 2017-06-29 MED ORDER — EPINEPHRINE 0.3 MG/0.3ML IJ SOAJ: 0.3000 mg | Freq: Once | INTRAMUSCULAR | 1 refills | Status: AC

## 2017-06-29 MED ORDER — MOMETASONE FUROATE 50 MCG/ACT NA SUSP: 2.0000 | Freq: Every day | NASAL | 5 refills | Status: DC | PRN

## 2017-06-29 NOTE — Assessment & Plan Note (Addendum)
   Tobacco cessation has been encouraged.  Continue albuterol HFA, 1-2 inhalations every 6 hours if needed.  I have also recommended taking albuterol 10-15 minutes prior to vigorous exercise.  A refill prescription has been provided for albuterol HFA.  Subjective and objective measures of pulmonary function will be followed and the treatment plan will be adjusted accordingly.

## 2017-06-29 NOTE — Assessment & Plan Note (Signed)
This may represent a very mild form of a physical urticaria.  Levocetirizine has been prescribed (as above).  If this problem progresses or changes in character, we will evaluate further.

## 2017-06-29 NOTE — Assessment & Plan Note (Signed)
   Aeroallergen avoidance measures have been discussed and provided in written form.  A prescription has been provided for levocetirizine, 5 mg daily as needed.  A prescription has been provided for Nasonex nasal spray, one spray per nostril 1-2 times daily as needed. Proper nasal spray technique has been discussed and demonstrated.  Nasal saline spray (i.e., Simply Saline) or nasal saline lavage (i.e., NeilMed) is recommended as needed and prior to medicated nasal sprays.  If allergen avoidance measures and medications fail to adequately relieve symptoms, aeroallergen immunotherapy will be considered. 

## 2017-06-29 NOTE — Progress Notes (Signed)
New Patient Note  RE: Alexander Osborne MRN: [id number] DOB: 1980/10/22 Date of Office Visit: 06/29/2017  Referring provider: Wardell Honour, MD Primary care provider: Wardell Honour, MD  Chief Complaint: Allergic Reaction; Allergic Rhinitis ; Asthma; and Pruritus   History of present illness: Alexander Osborne is a 37 y.o. male seen today in consultation requested by Reginia Forts, MD.  Approximately 2 months ago, he consumed one spoonful of soup containing shrimp and his tongue became pruritic.  This symptom resolved without medical intervention.  He did not experience concomitant urticaria, angioedema, cardiopulmonary symptoms, or other GI symptoms.  He does not recall having consumed shellfish prior to that time and has not eaten shellfish since that time. [name] was diagnosed with asthma in 2004.  His symptoms, including coughing, dyspnea, chest tightness, wheezing, are typically triggered by upper respiratory tract infections, exercise, and coincide with nasal allergy symptoms.  He has had to go to the emergency department in the past for treatment of an asthma exacerbation but has never been hospitalized or intubated.  He currently uses albuterol rescue 1 time per week.  He does not use albuterol prior to exercise. He experiences nasal congestion, rhinorrhea, sneezing, postnasal drainage, nasal pruritus, and ocular pruritus.  These symptoms occur year around but are most frequent and severe during the spring time in the fall.  Specific symptom triggers include pollen and dust. He complains of pruritus of the back in the hands when he takes hot showers.  He does not experience concomitant urticaria, angioedema, cardiopulmonary symptoms, or GI symptoms.   Assessment and plan: Food allergy The patient's history suggests shellfish allergy and positive skin test results today confirm this diagnosis.  Meticulous avoidance of shellfish as discussed.  A prescription has been provided  for epinephrine auto-injector 2 pack along with instructions for proper administration.  A food allergy action plan has been provided and discussed.  Medic Alert identification is recommended.  Perennial and seasonal allergic rhinitis  Aeroallergen avoidance measures have been discussed and provided in written form.  A prescription has been provided for levocetirizine, 5 mg daily as needed.  A prescription has been provided for Nasonex nasal spray, one spray per nostril 1-2 times daily as needed. Proper nasal spray technique has been discussed and demonstrated.  Nasal saline spray (i.e., Simply Saline) or nasal saline lavage (i.e., NeilMed) is recommended as needed and prior to medicated nasal sprays.  If allergen avoidance measures and medications fail to adequately relieve symptoms, aeroallergen immunotherapy will be considered.  Mild intermittent asthma  Tobacco cessation has been encouraged.  Continue albuterol HFA, 1-2 inhalations every 6 hours if needed.  I have also recommended taking albuterol 10-15 minutes prior to vigorous exercise.  A refill prescription has been provided for albuterol HFA.  Subjective and objective measures of pulmonary function will be followed and the treatment plan will be adjusted accordingly.  Pruritus This may represent a very mild form of a physical urticaria.  Levocetirizine has been prescribed (as above).  If this problem progresses or changes in character, we will evaluate further.   Meds ordered this encounter  Medications  . EPINEPHrine (AUVI-Q) 0.3 mg/0.3 mL IJ SOAJ injection    Sig: Inject 0.3 mLs (0.3 mg total) into the muscle once for 1 dose.    Dispense:  2 Device    Refill:  1  . levocetirizine (XYZAL) 5 MG tablet    Sig: Take 1 tablet (5 mg total) by mouth every evening.  Dispense:  30 tablet    Refill:  5  . mometasone (NASONEX) 50 MCG/ACT nasal spray    Sig: Place 2 sprays into the nose daily as needed.    Dispense:   17 g    Refill:  5  . albuterol (PROVENTIL HFA;VENTOLIN HFA) 108 (90 Base) MCG/ACT inhaler    Sig: Inhale 2 puffs into the lungs every 4 (four) hours as needed for wheezing or shortness of breath (cough, shortness of breath or wheezing.).    Dispense:  1 Inhaler    Refill:  12    Diagnostics: Spirometry: Spirometry reveals an FVC of 4.12 L (80% predicted) and an FEV1 of 3.17 L (74% predicted) with 150 mL (5%) postbronchodilator improvement.  This study was performed while the patient was asymptomatic.  Please see scanned spirometry results for details. Environmental skin testing: Positive to grass pollen, weed pollen, ragweed pollen, tree pollen, molds, cat hair, dog epithelia, cockroach antigen, and dust mite antigen. Food allergen skin testing: Positive to shellfish mix, shrimp, crab, lobster, oyster, scallops, and flounder.    Physical examination: Blood pressure 110/60, pulse 64, temperature 97.8 F (36.6 C), temperature source Oral, resp. rate 16, height 5' 9.1" (1.755 m), weight 210 lb 3.2 oz (95.3 kg), SpO2 97 %.  General: Alert, interactive, in no acute distress. HEENT: TMs pearly gray, turbinates edematous with thick discharge, post-pharynx moderately erythematous. Neck: Supple without lymphadenopathy. Lungs: Clear to auscultation without wheezing, rhonchi or rales. CV: Normal S1, S2 without murmurs. Abdomen: Nondistended, nontender. Skin: Warm and dry, without lesions or rashes. Extremities:  No clubbing, cyanosis or edema. Neuro:   Grossly intact.  Review of systems:  Review of systems negative except as noted in HPI / PMHx or noted below: Review of Systems  Constitutional: Negative.   HENT: Negative.   Eyes: Negative.   Respiratory: Negative.   Cardiovascular: Negative.   Gastrointestinal: Negative.   Genitourinary: Negative.   Musculoskeletal: Negative.   Skin: Negative.   Neurological: Negative.   Endo/Heme/Allergies: Negative.   Psychiatric/Behavioral:  Negative.     Past medical history:  Past Medical History:  Diagnosis Date  . Allergy   . Asthma     Past surgical history:  Past Surgical History:  Procedure Laterality Date  . corneal transplant left    . EYE SURGERY      Family history: Family History  Problem Relation Age of Onset  . Cancer Mother 63       pancreatic cancer  . Hypertension Father   . Heart disease Father 20       CAD  . Bipolar disorder Brother   . Diabetes Sister   . Allergic rhinitis Neg Hx   . Angioedema Neg Hx   . Asthma Neg Hx   . Eczema Neg Hx   . Immunodeficiency Neg Hx   . Urticaria Neg Hx     Social history: Social History   Socioeconomic History  . Marital status: Married    Spouse name: Not on file  . Number of children: 0  . Years of education: Not on file  . Highest education level: Not on file  Social Needs  . Financial resource strain: Not on file  . Food insecurity - worry: Not on file  . Food insecurity - inability: Not on file  . Transportation needs - medical: Not on file  . Transportation needs - non-medical: Not on file  Occupational History  . 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  Smoking status: Former Smoker    Packs/day: 0.25    Types: Cigarettes  . Smokeless tobacco: Never Used  . Tobacco comment: Quit 02/2015  Substance and Sexual Activity  . Alcohol use: Yes    Alcohol/week: 0.6 oz    Types: 1 Standard drinks or equivalent per week  . Drug use: No  . Sexual activity: Yes    Partners: Male    Birth control/protection: None    Comment: SS partner  Other Topics Concern  . Not on file  Social History Narrative   Marital status: married x 4 years; happily married.       Lives; with husbnad      Employment: Librarian, academic at [employer]: socially; weekends      Alcohol: 1-3 drinks per week      Drugs: none      Exercise: none   Denies caffeine use    Environmental History: Patient lives in a house  with laminate floors throughout, gas heat, and central air.  There is a dog in the home which does not have access to his bedroom.  There is no known mold/water damage in the home.  He is a cigarette smoker.  Allergies as of 06/29/2017   No Known Allergies     Medication List        Accurate as of 06/29/17  1:25 PM. Always use your most recent med list.          albuterol 108 (90 Base) MCG/ACT inhaler Commonly known as:  PROVENTIL HFA;VENTOLIN HFA Inhale 2 puffs into the lungs every 4 (four) hours as needed for wheezing or shortness of breath (cough, shortness of breath or wheezing.).   EPINEPHrine 0.3 mg/0.3 mL Soaj injection Commonly known as:  AUVI-Q Inject 0.3 mLs (0.3 mg total) into the muscle once for 1 dose.   glucose blood test strip Check sugar once daily  Dx: DMII controlled, non-insulin, without complications   glucose monitoring kit monitoring kit 1 each by Does not apply route as needed for other. 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   loratadine 10 MG tablet Commonly known as:  CLARITIN Take 10 mg by mouth daily.   mometasone 50 MCG/ACT nasal spray Commonly known as:  NASONEX Place 2 sprays into the nose daily as needed.   NASACORT ALLERGY 24HR NA Place into the nose.   prednisoLONE acetate 1 % ophthalmic suspension Commonly known as:  PRED FORTE 1 drop 4 (four) times daily.       Known medication allergies: No Known Allergies  I appreciate the opportunity to take part in Eldra's care. Please do not hesitate to contact me with questions.  Sincerely,   R. Edgar Frisk, MD

## 2017-06-29 NOTE — Patient Instructions (Addendum)
Food allergy The patient's history suggests shellfish allergy and positive skin test results today confirm this diagnosis.  Meticulous avoidance of shellfish as discussed.  A prescription has been provided for epinephrine auto-injector 2 pack along with instructions for proper administration.  A food allergy action plan has been provided and discussed.  Medic Alert identification is recommended.  Perennial and seasonal allergic rhinitis  Aeroallergen avoidance measures have been discussed and provided in written form.  A prescription has been provided for levocetirizine, 5 mg daily as needed.  A prescription has been provided for Nasonex nasal spray, one spray per nostril 1-2 times daily as needed. Proper nasal spray technique has been discussed and demonstrated.  Nasal saline spray (i.e., Simply Saline) or nasal saline lavage (i.e., NeilMed) is recommended as needed and prior to medicated nasal sprays.  If allergen avoidance measures and medications fail to adequately relieve symptoms, aeroallergen immunotherapy will be considered.  Mild intermittent asthma  Tobacco cessation has been encouraged.  Continue albuterol HFA, 1-2 inhalations every 6 hours if needed.  I have also recommended taking albuterol 10-15 minutes prior to vigorous exercise.  A refill prescription has been provided for albuterol HFA.  Subjective and objective measures of pulmonary function will be followed and the treatment plan will be adjusted accordingly.  Pruritus This may represent a very mild form of a physical urticaria.  Levocetirizine has been prescribed (as above).  If this problem progresses or changes in character, we will evaluate further.   Return in about 4 months (around 10/29/2017), or if symptoms worsen or fail to improve.  Reducing Pollen Exposure  The American Academy of Allergy, Asthma and Immunology suggests the following steps to reduce your exposure to pollen during allergy  seasons.    1. Do not hang sheets or clothing out to dry; pollen may collect on these items. 2. Do not mow lawns or spend time around freshly cut grass; mowing stirs up pollen. 3. Keep windows closed at night.  Keep car windows closed while driving. 4. Minimize morning activities outdoors, a time when pollen counts are usually at their highest. 5. Stay indoors as much as possible when pollen counts or humidity is high and on windy days when pollen tends to remain in the air longer. 6. Use air conditioning when possible.  Many air conditioners have filters that trap the pollen spores. 7. Use a HEPA room air filter to remove pollen form the indoor air you breathe.   Control of House Dust Mite Allergen  House dust mites play a major role in allergic asthma and rhinitis.  They occur in environments with high humidity wherever human skin, the food for dust mites is found. High levels have been detected in dust obtained from mattresses, pillows, carpets, upholstered furniture, bed covers, clothes and soft toys.  The principal allergen of the house dust mite is found in its feces.  A gram of dust may contain 1,000 mites and 250,000 fecal particles.  Mite antigen is easily measured in the air during house cleaning activities.    1. Encase mattresses, including the box spring, and pillow, in an air tight cover.  Seal the zipper end of the encased mattresses with wide adhesive tape. 2. Wash the bedding in water of 130 degrees Farenheit weekly.  Avoid cotton comforters/quilts and flannel bedding: the most ideal bed covering is the dacron comforter. 3. Remove all upholstered furniture from the bedroom. 4. Remove carpets, carpet padding, rugs, and non-washable window drapes from the bedroom.  Wash drapes  weekly or use plastic window coverings. 5. Remove all non-washable stuffed toys from the bedroom.  Wash stuffed toys weekly. 6. Have the room cleaned frequently with a vacuum cleaner and a damp dust-mop.   The patient should not be in a room which is being cleaned and should wait 1 hour after cleaning before going into the room. 7. Close and seal all heating outlets in the bedroom.  Otherwise, the room will become filled with dust-laden air.  An electric heater can be used to heat the room. Reduce indoor humidity to less than 50%.  Do not use a humidifier.  Control of Dog or Cat Allergen  Avoidance is the best way to manage a dog or cat allergy. If you have a dog or cat and are allergic to dog or cats, consider removing the dog or cat from the home. If you have a dog or cat but don't want to find it a new home, or if your family wants a pet even though someone in the household is allergic, here are some strategies that may help keep symptoms at bay:  1. Keep the pet out of your bedroom and restrict it to only a few rooms. Be advised that keeping the dog or cat in only one room will not limit the allergens to that room. 2. Don't pet, hug or kiss the dog or cat; if you do, wash your hands with soap and water. 3. High-efficiency particulate air (HEPA) cleaners run continuously in a bedroom or living room can reduce allergen levels over time. 4. Place electrostatic material sheet in the air inlet vent in the bedroom. 5. Regular use of a high-efficiency vacuum cleaner or a central vacuum can reduce allergen levels. 6. Giving your dog or cat a bath at least once a week can reduce airborne allergen.  Control of Mold Allergen  Mold and fungi can grow on a variety of surfaces provided certain temperature and moisture conditions exist.  Outdoor molds grow on plants, decaying vegetation and soil.  The major outdoor mold, Alternaria and Cladosporium, are found in very high numbers during hot and dry conditions.  Generally, a late Summer - Fall peak is seen for common outdoor fungal spores.  Rain will temporarily lower outdoor mold spore count, but counts rise rapidly when the rainy period ends.  The most  important indoor molds are Aspergillus and Penicillium.  Dark, humid and poorly ventilated basements are ideal sites for mold growth.  The next most common sites of mold growth are the bathroom and the kitchen.  Outdoor [employer] 1. Use air conditioning and keep windows closed 2. Avoid exposure to decaying vegetation. 3. Avoid leaf raking. 4. Avoid grain handling. 5. Consider wearing a face mask if working in moldy areas.  Indoor Mold Control 1. Maintain humidity below 50%. 2. Clean washable surfaces with 5% bleach solution. 3. Remove sources e.g. Contaminated carpets.  Control of Cockroach Allergen  Cockroach allergen has been identified as an important cause of acute attacks of asthma, especially in urban settings.  There are fifty-five species of cockroach that exist in the [place], however only three, the [place], [place] species produce allergen that can affect patients with Asthma.  Allergens can be obtained from fecal particles, egg casings and secretions from cockroaches.    1. Remove food sources. 2. Reduce access to water. 3. Seal access and entry points. 4. Spray runways with 0.5-1% Diazinon or Chlorpyrifos 5. Blow boric acid power under stoves and refrigerator. 6.  Place bait stations (hydramethylnon) at feeding sites.

## 2017-06-29 NOTE — Assessment & Plan Note (Signed)
The patient's history suggests shellfish allergy and positive skin test results today confirm this diagnosis.  Meticulous avoidance of shellfish as discussed.  A prescription has been provided for epinephrine auto-injector 2 pack along with instructions for proper administration.  A food allergy action plan has been provided and discussed.  Medic Alert identification is recommended. 

## 2017-07-21 ENCOUNTER — Encounter: Payer: Self-pay | Admitting: Urgent Care

## 2017-07-21 ENCOUNTER — Ambulatory Visit: Payer: Managed Care, Other (non HMO) | Admitting: Urgent Care

## 2017-07-21 VITALS — BP 127/73 | HR 95 | Temp 98.6°F | Resp 17 | Ht 69.0 in | Wt 208.0 lb

## 2017-07-21 DIAGNOSIS — B9789 Other viral agents as the cause of diseases classified elsewhere: Secondary | ICD-10-CM | POA: Diagnosis not present

## 2017-07-21 DIAGNOSIS — R0602 Shortness of breath: Secondary | ICD-10-CM

## 2017-07-21 DIAGNOSIS — J4530 Mild persistent asthma, uncomplicated: Secondary | ICD-10-CM

## 2017-07-21 DIAGNOSIS — J029 Acute pharyngitis, unspecified: Secondary | ICD-10-CM | POA: Diagnosis not present

## 2017-07-21 DIAGNOSIS — J069 Acute upper respiratory infection, unspecified: Secondary | ICD-10-CM

## 2017-07-21 MED ORDER — CETIRIZINE HCL 10 MG PO TABS: 10.0000 mg | ORAL_TABLET | Freq: Every day | ORAL | 11 refills | Status: DC

## 2017-07-21 MED ORDER — HYDROCODONE-HOMATROPINE 5-1.5 MG/5ML PO SYRP: 5.0000 mL | ORAL_SOLUTION | Freq: Every evening | ORAL | 0 refills | Status: DC | PRN

## 2017-07-21 MED ORDER — BENZONATATE 100 MG PO CAPS: 100.0000 mg | ORAL_CAPSULE | Freq: Three times a day (TID) | ORAL | 0 refills | Status: DC | PRN

## 2017-07-21 NOTE — Patient Instructions (Addendum)
Hydrate well with at least 2 liters (1 gallon) of water daily. For sore throat try using a honey-based tea. Use 3 teaspoons of honey with juice squeezed from half lemon. Place shaved pieces of ginger into 1/2-1 cup of water and warm over stove top. Then mix the ingredients and repeat every 4 hours as needed.     Viral Respiratory Infection A respiratory infection is an illness that affects part of the respiratory system, such as the lungs, nose, or throat. Most respiratory infections are caused by either viruses or bacteria. A respiratory infection that is caused by a virus is called a viral respiratory infection. Common types of viral respiratory infections include:  A cold.  The flu (influenza).  A respiratory syncytial virus (RSV) infection.  How do I know if I have a viral respiratory infection? Most viral respiratory infections cause:  A stuffy or runny nose.  Yellow or green nasal discharge.  A cough.  Sneezing.  Fatigue.  Achy muscles.  A sore throat.  Sweating or chills.  A fever.  A headache.  How are viral respiratory infections treated? If influenza is diagnosed early, it may be treated with an antiviral medicine that shortens the length of time a person has symptoms. Symptoms of viral respiratory infections may be treated with over-the-counter and prescription medicines, such as:  Expectorants. These make it easier to cough up mucus.  Decongestant nasal sprays.  Health care providers do not prescribe antibiotic medicines for viral infections. This is because antibiotics are designed to kill bacteria. They have no effect on viruses. How do I know if I should stay home from work or school? To avoid exposing others to your respiratory infection, stay home if you have:  A fever.  A persistent cough.  A sore throat.  A runny nose.  Sneezing.  Muscles aches.  Headaches.  Fatigue.  Weakness.  Chills.  Sweating.  Nausea.  Follow these  instructions at home:  Rest as much as possible.  Take over-the-counter and prescription medicines only as told by your health care provider.  Drink enough fluid to keep your urine clear or pale yellow. This helps prevent dehydration and helps loosen up mucus.  Gargle with a salt-water mixture 3-4 times per day or as needed. To make a salt-water mixture, completely dissolve -1 tsp of salt in 1 cup of warm water.  Use nose drops made from salt water to ease congestion and soften raw skin around your nose.  Do not drink alcohol.  Do not use tobacco products, including cigarettes, chewing tobacco, and e-cigarettes. If you need help quitting, ask your health care provider. Contact a health care provider if:  Your symptoms last for 10 days or longer.  Your symptoms get worse over time.  You have a fever.  You have severe sinus pain in your face or forehead.  The glands in your jaw or neck become very swollen. Get help right away if:  You feel pain or pressure in your chest.  You have shortness of breath.  You faint or feel like you will faint.  You have severe and persistent vomiting.  You feel confused or disoriented. This information is not intended to replace advice given to you by your health care provider. Make sure you discuss any questions you have with your health care provider. Document Released: 01/15/2005 Document Revised: 09/13/2015 Document Reviewed: 09/13/2014 Elsevier Interactive Patient Education  2018 Elsevier Inc.     IF you received an x-ray today, you will receive   an invoice from [facility]. Please contact [facility] at [phone] with questions or concerns regarding your invoice.   IF you received labwork today, you will receive an invoice from [facility]. Please contact [facility] at [phone] with questions or concerns regarding your invoice.   Our billing staff will not be able to assist you with questions regarding bills from  these companies.  You will be contacted with the lab results as soon as they are available. The fastest way to get your results is to activate your My Chart account. Instructions are located on the last page of this paperwork. If you have not heard from us regarding the results in 2 weeks, please contact this office.      

## 2017-07-21 NOTE — Progress Notes (Signed)
    MRN: [id number] DOB: 1981-03-10  Subjective:   Alexander Osborne is a 37 y.o. male presenting for 1 day history of sore throat, throat congestion, productive cough. Had post-tussive emesis yesterday. Has tried Mucinex. Has needed to use inhaler 2-3 times yesterday and today. Smokes ~4 cigarettes per day, but has not smoked since Sunday.   Alexander Osborne has a current medication list which includes the following prescription(s): albuterol, glucose blood, glucose monitoring kit, guaifenesin, lancets, loratadine, mometasone, and prednisolone acetate. Also has No Known Allergies.  Alexander Osborne  has a past medical history of Allergy and Asthma. Also  has a past surgical history that includes Eye surgery and corneal transplant left.  Objective:   Vitals: BP 127/73   Pulse 95   Temp 98.6 F (37 C) (Oral)   Resp 17   Ht '5\' 9"'$  (1.753 m)   Wt 208 lb (94.3 kg)   SpO2 98%   BMI 30.72 kg/m   Physical Exam  Constitutional: He is oriented to person, place, and time. He appears well-developed and well-nourished.  HENT:  Right Ear: Tympanic membrane normal.  Left Ear: Tympanic membrane normal.  Nose: No sinus tenderness.  Mouth/Throat: Oropharynx is clear and moist.  Eyes: Right eye exhibits no discharge. Left eye exhibits no discharge.  Cardiovascular: Normal rate, regular rhythm and intact distal pulses. Exam reveals no gallop and no friction rub.  No murmur heard. Pulmonary/Chest: No respiratory distress. He has no wheezes. He has no rales.  Neurological: He is alert and oriented to person, place, and time.  Skin: Skin is warm and dry.  Psychiatric: He has a normal mood and affect.   Assessment and Plan :   Viral URI with cough  Sore throat  Shortness of breath  Mild persistent asthma without complication  Will start supportive care for viral illness. Use cough suppression medications and schedule inhaler. Patient agreed to hold off on smoking. Return-to-clinic precautions discussed,  patient verbalized understanding.   Jaynee Eagles, PA-C [facility] [phone] 07/21/2017  5:40 PM

## 2017-07-29 ENCOUNTER — Ambulatory Visit: Payer: Self-pay | Admitting: Family Medicine

## 2017-07-30 ENCOUNTER — Encounter: Payer: Self-pay | Admitting: *Deleted

## 2017-08-10 ENCOUNTER — Other Ambulatory Visit: Payer: Self-pay

## 2017-08-10 ENCOUNTER — Encounter: Payer: Self-pay | Admitting: Family Medicine

## 2017-08-10 ENCOUNTER — Ambulatory Visit (INDEPENDENT_AMBULATORY_CARE_PROVIDER_SITE_OTHER): Payer: Managed Care, Other (non HMO) | Admitting: Family Medicine

## 2017-08-10 VITALS — BP 118/70 | HR 87 | Temp 98.0°F | Resp 16 | Wt 203.0 lb

## 2017-08-10 DIAGNOSIS — E119 Type 2 diabetes mellitus without complications: Secondary | ICD-10-CM

## 2017-08-10 DIAGNOSIS — J3089 Other allergic rhinitis: Secondary | ICD-10-CM | POA: Diagnosis not present

## 2017-08-10 DIAGNOSIS — R1013 Epigastric pain: Secondary | ICD-10-CM

## 2017-08-10 DIAGNOSIS — R5383 Other fatigue: Secondary | ICD-10-CM

## 2017-08-10 DIAGNOSIS — T7800XD Anaphylactic reaction due to unspecified food, subsequent encounter: Secondary | ICD-10-CM

## 2017-08-10 DIAGNOSIS — J4520 Mild intermittent asthma, uncomplicated: Secondary | ICD-10-CM

## 2017-08-10 LAB — GLUCOSE, POCT (MANUAL RESULT ENTRY): POC GLUCOSE: 87 mg/dL (ref 70–99)

## 2017-08-10 LAB — POCT GLYCOSYLATED HEMOGLOBIN (HGB A1C): Hemoglobin A1C: 5.4

## 2017-08-10 NOTE — Patient Instructions (Signed)
     IF you received an x-ray today, you will receive an invoice from [facility]. Please contact [facility] at [phone] with questions or concerns regarding your invoice.   IF you received labwork today, you will receive an invoice from [facility]. Please contact [facility] at [phone] with questions or concerns regarding your invoice.   Our billing staff will not be able to assist you with questions regarding bills from these companies.  You will be contacted with the lab results as soon as they are available. The fastest way to get your results is to activate your My Chart account. Instructions are located on the last page of this paperwork. If you have not heard from us regarding the results in 2 weeks, please contact this office.     

## 2017-08-10 NOTE — Progress Notes (Signed)
Subjective:    Patient ID: Alexander Osborne, male    DOB: 06/29/1980, 37 y.o.   MRN: [id number]  08/10/2017  URI (follow-up pt states he feels better but still has some horseness going on. ) and Diabetes    HPI This 37 y.o. male presents for three month follow-up of diabetes, fatigue, chronic lower abdominal pain, asthma, allergic rhinitis, obesity.  Management changes made at last visit include the following:  New onset fatigue.  Patient very concerned with new onset diabetes due to strong family history.  Status post in-home sleep study that was negative for obstructive sleep apnea.  Glucose 122 in the office and hemoglobin A1c 7.1 which is consistent with diabetes mellitus type 2.   I recommend weight loss, exercise, and low-carbohydrate low-sugar food choices. You should AVOID: regular sodas, sweetened tea, fruit juices.  You should LIMIT: breads, pastas, rice, potatoes, and desserts/sweets.  I would recommend limiting your total carbohydrate intake per meal to 45 grams; I would limit your total carbohydrate intake per snack to 30 grams.  I would also have a goal of 60 grams of protein intake per day; this would equal 10-15 grams of protein per meal and 5-10 grams of protein per snack.  Refer for diabetic education and nutrition counseling.  Prescription for glucometer, lancets, test strips provided.  No medication prescribed at this time as I do think patient can significantly improve glucose levels with weight loss, dietary modification, and exercise. Chronic lower abdominal pain intermittently.  Obtain labs.  Refer to gastroenterology to evaluate further.  Most consistent with irritable bowel syndrome yet warrants further evaluation to rule out inflammatory bowel disease. Mild intermittent asthma and allergic rhinitis.  Refer for allergy testing.  Status post Tdap and influenza vaccines in office. Recommend weight loss, exercise for 30-60 minutes five days per week; recommend 2000 kcal  restriction per day with a minimum of 60 grams of protein per day. Lab results from last visit: Nice to meet you this week. No evidence of anemia with a hemoglobin of 15.6. Thyroid function is normal. Triglycerides are extremely elevated at 459. Triglyceride levels represent the amount of fat intake in your diet. I recommend exercise, weight loss, and a low-fat diet. Frequently, when your sugars are elevated we also see an elevated triglyceride level. As you decrease your sugar levels, I anticipate to see your triglyceride levels improved as well. Liver and kidney functions are normal  UPDATE: Resting feels weird.  Doesn't feel good.  Eating good amount of protein.  Normal servings.  Cut out a lot of carbs. Has lost 30 pounds in three months.   S/p nutritionist consultation. Stopped checking sugar. Simple sugars and rice and pasta; favorite things.  Portion sizes are much smaller.   Rice is alike a drug; very addictive.  Eats it once per week. Exercises five days per week.  Mainly lifts weights; cardio.  B: 2 eggs, fruit or ham sandwich with whole grain bread. S: almonds or cheesestick or berries. Lunch: leftovers (vegetables, protein, salad). Supper: meat, cauliflower rice.  Fried rice.   Beverages water and carbonated water; no added sugars. Beer light.   Cruise; did every well; [place], [place].   Lost weight immediately. Once per week; feels eweird; goes to bed at 12-1; eight hours of sleep.   Sleeping more.  Husband sleeps too much. Eating has no effect; moving around helps.   Must motivate self to move.  Lack of energy.  Feels badly; not exhausted.  Feels sick.  No achy.  Dizzy.  Unsteady; off. S/p sleep study.   Did home study at that time.  Was working out and losing weight.  Snores less; sleeps more. Two years ago. Same insurance.  Same frequency; usually on days off.  Sex drive is great; no ED. Did not go into gastroenterologist; took 1.5 months to call; by then,  stomach problems resolved.  All GI issues.  Asthma and allergist: s/p allergic rhinitis; food allergies confirmed; no seafood.  Medicated.   BP Readings from Last 3 Encounters:  08/10/17 118/70  07/21/17 127/73  06/29/17 110/60   Wt Readings from Last 3 Encounters:  08/10/17 203 lb (92.1 kg)  07/21/17 208 lb (94.3 kg)  06/29/17 210 lb 3.2 oz (95.3 kg)   Immunization History  Administered Date(s) Administered  . Influenza,inj,Quad PF,6+ Mos 04/29/2017  . Tdap 04/29/2017    Review of Systems  Constitutional: Positive for fatigue. Negative for activity change, appetite change, chills, diaphoresis and fever.  HENT: Negative for congestion, postnasal drip and rhinorrhea.   Respiratory: Negative for cough and shortness of breath.   Cardiovascular: Negative for chest pain, palpitations and leg swelling.  Gastrointestinal: Negative for abdominal pain, diarrhea, nausea and vomiting.  Endocrine: Negative for cold intolerance, heat intolerance, polydipsia, polyphagia and polyuria.  Skin: Negative for color change, rash and wound.  Neurological: Negative for dizziness, tremors, seizures, syncope, facial asymmetry, speech difficulty, weakness, light-headedness, numbness and headaches.  Psychiatric/Behavioral: Negative for dysphoric mood and sleep disturbance. The patient is not nervous/anxious.     Past Medical History:  Diagnosis Date  . Allergy   . Asthma    Past Surgical History:  Procedure Laterality Date  . corneal transplant left    . EYE SURGERY     No Known Allergies Current Outpatient Medications on File Prior to Visit  Medication Sig Dispense Refill  . albuterol (PROVENTIL HFA;VENTOLIN HFA) 108 (90 Base) MCG/ACT inhaler Inhale 2 puffs into the lungs every 4 (four) hours as needed for wheezing or shortness of breath (cough, shortness of breath or wheezing.). 1 Inhaler 12  . cetirizine (ZYRTEC) 10 MG tablet Take 1 tablet (10 mg total) by mouth daily. 30 tablet 11  . glucose  blood test strip Check sugar once daily  Dx: DMII controlled, non-insulin, without complications 951 each 12  . glucose monitoring kit (FREESTYLE) monitoring kit 1 each by Does not apply route as needed for other. 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 Lancets MISC Check sugar once daily dx DMII controlled, non-insulin, without complications 884 each 11  . mometasone (NASONEX) 50 MCG/ACT nasal spray Place 2 sprays into the nose daily as needed. 17 g 5  . prednisoLONE acetate (PRED FORTE) 1 % ophthalmic suspension 1 drop 4 (four) times daily.     No current facility-administered medications on file prior to visit.    Social History   Socioeconomic History  . Marital status: Married    Spouse name: Not on file  . Number of children: 0  . Years of education: Not on file  . Highest education level: Not on file  Occupational History  . Occupation: Best boy: [employer]  . Financial resource strain: Not on file  . Food insecurity:    Worry: Not on file    Inability: Not on file  . Transportation needs:    Medical: Not on file    Non-medical: Not on file  Tobacco Use  . Smoking  status: Current Every Day Smoker    Packs/day: 0.25    Years: 11.00    Pack years: 2.75    Types: Cigarettes  . Smokeless tobacco: Never Used  Substance and Sexual Activity  . Alcohol use: Yes    Alcohol/week: 0.6 oz    Types: 1 Standard drinks or equivalent per week  . Drug use: No  . Sexual activity: Yes    Partners: Male    Birth control/protection: None    Comment: SS partner  Lifestyle  . Physical activity:    Days per week: Not on file    Minutes per session: Not on file  . Stress: Not on file  Relationships  . Social connections:    Talks on phone: Not on file    Gets together: Not on file    Attends religious service: Not on file    Active member of club or organization: Not on file    Attends meetings of clubs or  organizations: Not on file    Relationship status: Not on file  . Intimate partner violence:    Fear of current or ex partner: Not on file    Emotionally abused: Not on file    Physically abused: Not on file    Forced sexual activity: Not on file  Other Topics Concern  . Not on file  Social History Narrative   Marital status: married x 4 years; happily married.       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 Cancer Mother 5       pancreatic cancer  . Hypertension Father   . Heart disease Father 84       CAD  . Bipolar disorder Brother   . Diabetes Sister   . Allergic rhinitis Neg Hx   . Angioedema Neg Hx   . Asthma Neg Hx   . Eczema Neg Hx   . Immunodeficiency Neg Hx   . Urticaria Neg Hx        Objective:    BP 118/70   Pulse 87   Temp 98 F (36.7 C) (Oral)   Resp 16   Wt 203 lb (92.1 kg)   SpO2 98%   BMI 29.98 kg/m  Physical Exam  Constitutional: He is oriented to person, place, and time. He appears well-developed and well-nourished. No distress.  HENT:  Head: Normocephalic and atraumatic.  Right Ear: External ear normal.  Left Ear: External ear normal.  Nose: Nose normal.  Mouth/Throat: Oropharynx is clear and moist.  Eyes: Pupils are equal, round, and reactive to light. Conjunctivae and EOM are normal.  Neck: Normal range of motion. Neck supple. Carotid bruit is not present. No thyromegaly present.  Cardiovascular: Normal rate, regular rhythm, normal heart sounds and intact distal pulses. Exam reveals no gallop and no friction rub.  No murmur heard. Pulmonary/Chest: Effort normal and breath sounds normal. He has no wheezes. He has no rales.  Abdominal: Soft. Bowel sounds are normal. He exhibits no distension and no mass. There is no tenderness.  There is no rebound and no guarding.  Lymphadenopathy:    He has no cervical adenopathy.  Neurological: He is alert and  oriented to person, place, and time. No cranial nerve deficit.  Skin: Skin is warm and dry. No rash noted. He is not diaphoretic.  Psychiatric: He has a normal mood and affect. His behavior is normal.  Nursing note and vitals reviewed.  No results found. Depression screen [facility] 2/9 08/10/2017 07/21/2017 05/28/2017 05/25/2017 05/22/2017  Decreased Interest 0 0 0 0 1  Down, Depressed, Hopeless 0 0 0 2 1  PHQ - 2 Score 0 0 0 2 2  Altered sleeping - - - 1 -  Tired, decreased energy - - - 1 -  Change in appetite - - - 1 -  Feeling bad or failure about yourself  - - - 1 -  Trouble concentrating - - - 0 -  Moving slowly or fidgety/restless - - - 0 -  Suicidal thoughts - - - 1 -  PHQ-9 Score - - - 7 -   Fall Risk  08/10/2017 07/21/2017 05/28/2017 05/25/2017 05/22/2017  Falls in the past year? No No No No No        Assessment & Plan:   1. Newly diagnosed diabetes ([place])   2. Other fatigue   3. Perennial and seasonal allergic rhinitis   4. Mild intermittent asthma without complication   5. Anaphylactic shock due to food, subsequent encounter   6. Abdominal pain, epigastric     DMII: newly diagnosed at last visit; patient has successfully lost 30 pounds in three months by exercise and strict dietary modifications; s/p nutritionist consultation.  Congratulations on success!  Obtain labs.  Allergic rhinitis and mild intermittent asthma without complications with food allergies: s/p allergy and immunology consultation.  S/p allergy testing.  Maintained on Zyrtec, Nasonex.  SHELLFISH ALLERGY.  Abdominal pain epigastric with intermittent nausea: improved with dietary modification.   Fatigue/hypersomnolence: New onset.  Obtain labs; refer for sleep study.  Underlying stress may be contributing.    Orders Placed This Encounter  Procedures  . VITAMIN D 25 Hydroxy (Vit-D Deficiency,  Fractures)  . Vitamin B12  . Iron  . Comprehensive metabolic panel  . Ambulatory referral to Sleep Studies    Referral Priority:   Routine    Referral Type:   Consultation    Referral Reason:   Specialty Services Required    Number of Visits Requested:   1  . POCT glucose (manual entry)  . POCT glycosylated hemoglobin (Hb A1C)   No orders of the defined types were placed in this encounter.   Return in about 3 months (around 11/09/2017) for recheck.   Alexander Osborne, M.D. Primary Care at [facility] previously [facility] [address] [place], [place]  [address] [phone] phone [phone] fax

## 2017-08-11 LAB — VITAMIN D 25 HYDROXY (VIT D DEFICIENCY, FRACTURES): VIT D 25 HYDROXY: 27.8 ng/mL — AB (ref 30.0–100.0)

## 2017-08-11 LAB — COMPREHENSIVE METABOLIC PANEL
ALBUMIN: 5.3 g/dL (ref 3.5–5.5)
ALK PHOS: 70 IU/L (ref 39–117)
ALT: 24 IU/L (ref 0–44)
AST: 18 IU/L (ref 0–40)
Albumin/Globulin Ratio: 1.6 (ref 1.2–2.2)
BILIRUBIN TOTAL: 0.4 mg/dL (ref 0.0–1.2)
BUN / CREAT RATIO: 22 — AB (ref 9–20)
BUN: 24 mg/dL — AB (ref 6–20)
CHLORIDE: 101 mmol/L (ref 96–106)
CO2: 23 mmol/L (ref 20–29)
Calcium: 10.3 mg/dL — ABNORMAL HIGH (ref 8.7–10.2)
Creatinine, Ser: 1.09 mg/dL (ref 0.76–1.27)
GFR calc Af Amer: 102 mL/min/{1.73_m2} (ref >59)
GFR calc non Af Amer: 88 mL/min/{1.73_m2} (ref >59)
GLUCOSE: 82 mg/dL (ref 65–99)
Globulin, Total: 3.3 g/dL (ref 1.5–4.5)
Potassium: 4.7 mmol/L (ref 3.5–5.2)
Sodium: 139 mmol/L (ref 134–144)
TOTAL PROTEIN: 8.6 g/dL — AB (ref 6.0–8.5)

## 2017-08-11 LAB — VITAMIN B12: Vitamin B-12: 458 pg/mL (ref 232–1245)

## 2017-08-11 LAB — IRON: Iron: 76 ug/dL (ref 38–169)

## 2017-08-18 ENCOUNTER — Other Ambulatory Visit: Payer: Self-pay

## 2017-08-18 MED ORDER — ALBUTEROL SULFATE HFA 108 (90 BASE) MCG/ACT IN AERS: 2.0000 | INHALATION_SPRAY | Freq: Four times a day (QID) | RESPIRATORY_TRACT | 1 refills | Status: DC | PRN

## 2017-08-20 ENCOUNTER — Encounter: Payer: Self-pay | Admitting: Allergy and Immunology

## 2017-09-04 ENCOUNTER — Encounter: Payer: Self-pay | Admitting: Family Medicine

## 2017-09-09 ENCOUNTER — Encounter: Payer: Self-pay | Admitting: Family Medicine

## 2017-11-09 ENCOUNTER — Ambulatory Visit: Payer: Managed Care, Other (non HMO) | Admitting: Family Medicine

## 2017-11-13 ENCOUNTER — Other Ambulatory Visit: Payer: Self-pay

## 2017-11-13 ENCOUNTER — Ambulatory Visit: Payer: Managed Care, Other (non HMO) | Admitting: Family Medicine

## 2017-11-13 ENCOUNTER — Encounter: Payer: Self-pay | Admitting: Family Medicine

## 2017-11-13 VITALS — BP 110/70 | HR 71 | Temp 98.5°F | Resp 16 | Ht 69.69 in | Wt 197.8 lb

## 2017-11-13 DIAGNOSIS — E7800 Pure hypercholesterolemia, unspecified: Secondary | ICD-10-CM

## 2017-11-13 DIAGNOSIS — J4520 Mild intermittent asthma, uncomplicated: Secondary | ICD-10-CM | POA: Diagnosis not present

## 2017-11-13 DIAGNOSIS — J3089 Other allergic rhinitis: Secondary | ICD-10-CM | POA: Diagnosis not present

## 2017-11-13 DIAGNOSIS — T7800XD Anaphylactic reaction due to unspecified food, subsequent encounter: Secondary | ICD-10-CM

## 2017-11-13 DIAGNOSIS — R1030 Lower abdominal pain, unspecified: Secondary | ICD-10-CM

## 2017-11-13 DIAGNOSIS — E119 Type 2 diabetes mellitus without complications: Secondary | ICD-10-CM

## 2017-11-13 DIAGNOSIS — R5383 Other fatigue: Secondary | ICD-10-CM

## 2017-11-13 LAB — POCT URINALYSIS DIP (MANUAL ENTRY)
BILIRUBIN UA: NEGATIVE mg/dL
Bilirubin, UA: NEGATIVE
Blood, UA: NEGATIVE
Glucose, UA: NEGATIVE mg/dL
Leukocytes, UA: NEGATIVE
Nitrite, UA: NEGATIVE
Protein Ur, POC: NEGATIVE mg/dL
Urobilinogen, UA: 0.2 E.U./dL
pH, UA: 5.5 (ref 5.0–8.0)

## 2017-11-13 NOTE — Progress Notes (Signed)
Subjective:    Patient ID: Alexander Osborne, male    DOB: 1980/10/11, 37 y.o.   MRN: [id number]  11/13/2017  Diabetes (follow-up )    HPI This 37 y.o. male presents for three month follow-up of DMII, allergic rhinitis, asthma, fatigue, GERD with abdominal pain epigastric.  Management changes made last visit include the following:  DMII: newly diagnosed at last visit; patient has successfully lost 30 pounds in three months by exercise and strict dietary modifications; s/p nutritionist consultation.  Congratulations on success!  Obtain labs. Allergic rhinitis and mild intermittent asthma without complications with food allergies: s/p allergy and immunology consultation.  S/p allergy testing.  Maintained on Zyrtec, Nasonex.  SHELLFISH ALLERGY. Abdominal pain epigastric with intermittent nausea: improved with dietary modification.  Fatigue/hypersomnolence: New onset.  Obtain labs; refer for sleep study.  Underlying stress may be contributing.  UPDATE: Weight down five days from last visit.  Energy level is good.   Episodes only occur if goes to sleep late.  Feels it is due to sleep deprivation. A normal sleep is eight hours; will sometimes only sleep six hours.  Gets cranky; feels off; feels down.   Trying to avoid sleep deprivation. Exercising five days per week. Husband is exercising with pateint; husband has lost 45 pounds.  Patient has lost 40 pounds.   No GI issues.     BP Readings from Last 3 Encounters:  11/13/17 110/70  08/10/17 118/70  07/21/17 127/73   Wt Readings from Last 3 Encounters:  11/13/17 197 lb 12.8 oz (89.7 kg)  08/10/17 203 lb (92.1 kg)  07/21/17 208 lb (94.3 kg)   Immunization History  Administered Date(s) Administered  . Influenza,inj,Quad PF,6+ Mos 04/29/2017  . Tdap 04/29/2017    Review of Systems  Constitutional: Negative for activity change, appetite change, chills, diaphoresis, fatigue, fever and unexpected weight change.  HENT: Negative  for congestion, dental problem, drooling, ear discharge, ear pain, facial swelling, hearing loss, mouth sores, nosebleeds, postnasal drip, rhinorrhea, sinus pressure, sneezing, sore throat, tinnitus, trouble swallowing and voice change.   Eyes: Negative for photophobia, pain, discharge, redness, itching and visual disturbance.  Respiratory: Negative for apnea, cough, choking, chest tightness, shortness of breath, wheezing and stridor.   Cardiovascular: Negative for chest pain, palpitations and leg swelling.  Gastrointestinal: Negative for abdominal pain, blood in stool, constipation, diarrhea, nausea and vomiting.  Endocrine: Negative for cold intolerance, heat intolerance, polydipsia, polyphagia and polyuria.  Genitourinary: Negative for decreased urine volume, difficulty urinating, discharge, dysuria, enuresis, flank pain, frequency, genital sores, hematuria, penile pain, penile swelling, scrotal swelling, testicular pain and urgency.  Musculoskeletal: Negative for arthralgias, back pain, gait problem, joint swelling, myalgias, neck pain and neck stiffness.  Skin: Negative for color change, pallor, rash and wound.  Allergic/Immunologic: Negative for environmental allergies, food allergies and immunocompromised state.  Neurological: Negative for dizziness, tremors, seizures, syncope, facial asymmetry, speech difficulty, weakness, light-headedness, numbness and headaches.  Hematological: Negative for adenopathy. Does not bruise/bleed easily.  Psychiatric/Behavioral: Negative for agitation, behavioral problems, confusion, decreased concentration, dysphoric mood, hallucinations, self-injury, sleep disturbance and suicidal ideas. The patient is not nervous/anxious and is not hyperactive.     Past Medical History:  Diagnosis Date  . Allergy   . Asthma    Past Surgical History:  Procedure Laterality Date  . corneal transplant left    . EYE SURGERY     Allergies  Allergen Reactions  . Shellfish  Allergy    Current Outpatient Medications on File Prior to Visit  Medication Sig Dispense Refill  . albuterol (PROVENTIL HFA;VENTOLIN HFA) 108 (90 Base) MCG/ACT inhaler Inhale 2 puffs into the lungs every 4 (four) hours as needed for wheezing or shortness of breath (cough, shortness of breath or wheezing.). 1 Inhaler 12  . albuterol (PROVENTIL HFA;VENTOLIN HFA) 108 (90 Base) MCG/ACT inhaler Inhale 2 puffs into the lungs every 6 (six) hours as needed for wheezing or shortness of breath. 18 g 1  . cetirizine (ZYRTEC) 10 MG tablet Take 1 tablet (10 mg total) by mouth daily. 30 tablet 11  . glucose blood test strip Check sugar once daily  Dx: DMII controlled, non-insulin, without complications 875 each 12  . glucose monitoring kit (FREESTYLE) monitoring kit 1 each by Does not apply route as needed for other. 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 Lancets MISC Check sugar once daily dx DMII controlled, non-insulin, without complications 797 each 11  . mometasone (NASONEX) 50 MCG/ACT nasal spray Place 2 sprays into the nose daily as needed. 17 g 5  . prednisoLONE acetate (PRED FORTE) 1 % ophthalmic suspension 1 drop 4 (four) times daily.     No current facility-administered medications on file prior to visit.    Social History   Socioeconomic History  . Marital status: Married    Spouse name: Not on file  . Number of children: 0  . Years of education: Not on file  . Highest education level: Not on file  Occupational History  . 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 Financial resource strain: Not on file  . Food insecurity:    Worry: Not on file    Inability: Not on file  . Transportation needs:    Medical: Not on file    Non-medical: Not on file  Tobacco Use  . Smoking status: Current Every Day Smoker    Packs/day: 0.25    Years: 11.00    Pack years: 2.75    Types: Cigarettes  . Smokeless tobacco: Never Used    Substance and Sexual Activity  . Alcohol use: Yes    Alcohol/week: 0.6 oz    Types: 1 Standard drinks or equivalent per week  . Drug use: No  . Sexual activity: Yes    Partners: Male    Birth control/protection: None    Comment: SS partner  Lifestyle  . Physical activity:    Days per week: Not on file    Minutes per session: Not on file  . Stress: Not on file  Relationships  . Social connections:    Talks on phone: Not on file    Gets together: Not on file    Attends religious service: Not on file    Active member of club or organization: Not on file    Attends meetings of clubs or organizations: Not on file    Relationship status: Not on file  . Intimate partner violence:    Fear of current or ex partner: Not on file    Emotionally abused: Not on file    Physically abused: Not on file    Forced sexual activity: Not on file  Other Topics Concern  . Not on file  Social History Narrative   Marital status: married x 4 years; happily married.       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 Cancer Mother 52       pancreatic cancer  . Hypertension Father   . Heart disease Father 64       CAD  . Bipolar disorder Brother   . Diabetes Sister   . Allergic rhinitis Neg Hx   . Angioedema Neg Hx   . Asthma Neg Hx   . Eczema Neg Hx   . Immunodeficiency Neg Hx   . Urticaria Neg Hx        Objective:    BP 110/70   Pulse 71   Temp 98.5 F (36.9 C) (Oral)   Resp 16   Ht 5' 9.69" (1.77 m)   Wt 197 lb 12.8 oz (89.7 kg)   SpO2 99%   BMI 28.64 kg/m  Physical Exam  Constitutional: He is oriented to person, place, and time. He appears well-developed and well-nourished. No distress.  HENT:  Head: Normocephalic and atraumatic.  Right Ear: External ear normal.   Left Ear: External ear normal.  Nose: Nose normal.  Mouth/Throat: Oropharynx is clear and moist.  Eyes: Pupils are equal, round, and reactive to light. Conjunctivae and EOM are normal.  Neck: Normal range of motion. Neck supple. Carotid bruit is not present. No thyromegaly present.  Cardiovascular: Normal rate, regular rhythm, normal heart sounds and intact distal pulses. Exam reveals no gallop and no friction rub.  No murmur heard. Pulmonary/Chest: Effort normal and breath sounds normal. He has no wheezes. He has no rales.  Abdominal: Soft. Bowel sounds are normal. He exhibits no distension and no mass. There is no tenderness. There is no rebound and no guarding.  Musculoskeletal:       Right shoulder: Normal.       Left shoulder: Normal.       Cervical back: Normal.  Lymphadenopathy:    He has no cervical adenopathy.  Neurological: He is alert and oriented to person, place, and time. He has normal reflexes. No cranial nerve deficit. He exhibits normal muscle tone. Coordination normal.  Skin: Skin is warm and dry. No rash noted. He is not diaphoretic.  Psychiatric: He has a normal mood and affect. His behavior is normal. Judgment and thought content normal.   No results found. Depression screen [facility] 2/9 11/13/2017 08/10/2017 07/21/2017 05/28/2017 05/25/2017  Decreased Interest 0 0 0 0 0  Down, Depressed, Hopeless 0 0 0 0 2  PHQ - 2 Score 0 0 0 0 2  Altered sleeping - - - - 1  Tired, decreased energy - - - - 1  Change in appetite - - - - 1  Feeling bad or failure about yourself  - - - - 1  Trouble concentrating - - - - 0  Moving slowly or fidgety/restless - - - - 0  Suicidal thoughts - - - - 1  PHQ-9 Score - - - - 7   Fall Risk  11/13/2017 08/10/2017 07/21/2017 05/28/2017 05/25/2017  Falls in the past year? _0         Assessment & Plan:   1. Newly diagnosed diabetes ([place])   2. Pure hypercholesterolemia   3. Mild intermittent asthma without complication   4. Perennial and  seasonal allergic rhinitis   5. Anaphylactic shock due to food, subsequent encounter   6. Other fatigue  7. Lower abdominal pain     Newly diagnosed diabetes mellitus type 2 and hypercholesterolemia: Improved with weight loss, dietary modifications, exercise.  Obtain labs for chronic disease management.  Continue with weight maintenance and regular exercise.  Asthma and allergic rhinitis and food allergies: Stable at this time.  Followed by immunology and allergy.  Abdominal pain: Resolved with dietary modification.  Feeling great at this time.  Fatigue: Resolved.  Orders Placed This Encounter  Procedures  . Microalbumin / creatinine urine ratio  . CBC with Differential/Platelet  . Comprehensive metabolic panel    Order Specific Question:   Has the patient fasted?    Answer:   No  . Hemoglobin A1c  . Lipid panel    Order Specific Question:   Has the patient fasted?    Answer:   No  . POCT urinalysis dipstick   No orders of the defined types were placed in this encounter.   Return in about 3 months (around 02/13/2018) for complete physical examiniation MIKE MANI.   Khaleef Ruby Elayne Guerin, M.D. Primary Care at [facility] previously [facility] [address] [place], [place]  [address] [phone] phone [phone] fax

## 2017-11-13 NOTE — Patient Instructions (Addendum)
   IF you received an x-ray today, you will receive an invoice from [facility]. Please contact [facility] at [phone] with questions or concerns regarding your invoice.   IF you received labwork today, you will receive an invoice from [facility]. Please contact [facility] at [phone] with questions or concerns regarding your invoice.   Our billing staff will not be able to assist you with questions regarding bills from these companies.  You will be contacted with the lab results as soon as they are available. The fastest way to get your results is to activate your My Chart account. Instructions are located on the last page of this paperwork. If you have not heard from us regarding the results in 2 weeks, please contact this office.    Food Choices to Lower Your Triglycerides Triglycerides are a type of fat in your blood. High levels of triglycerides can increase the risk of heart disease and stroke. If your triglyceride levels are high, the foods you eat and your eating habits are very important. Choosing the right foods can help lower your triglycerides. What general guidelines do I need to follow?  Lose weight if you are overweight.  Limit or avoid alcohol.  Fill one half of your plate with vegetables and green salads.  Limit fruit to two servings a day. Choose fruit instead of juice.  Make one fourth of your plate whole grains. Look for the word "whole" as the first word in the ingredient list.  Fill one fourth of your plate with lean protein foods.  Enjoy fatty fish (such as salmon, mackerel, sardines, and tuna) three times a week.  Choose healthy fats.  Limit foods high in starch and sugar.  Eat more home-cooked food and less restaurant, buffet, and fast food.  Limit fried foods.  Cook foods using methods other than frying.  Limit saturated fats.  Check ingredient lists to avoid foods with partially hydrogenated oils (trans fats) in them. What  foods can I eat? Grains Whole grains, such as whole wheat or whole grain breads, crackers, cereals, and pasta. Unsweetened oatmeal, bulgur, barley, quinoa, or brown rice. Corn or whole wheat flour tortillas. Vegetables Fresh or frozen vegetables (raw, steamed, roasted, or grilled). Green salads. Fruits All fresh, canned (in natural juice), or frozen fruits. Meat and Other Protein Products Ground beef (85% or leaner), grass-fed beef, or beef trimmed of fat. Skinless chicken or turkey. Ground chicken or turkey. Pork trimmed of fat. All fish and seafood. Eggs. Dried beans, peas, or lentils. Unsalted nuts or seeds. Unsalted canned or dry beans. Dairy Low-fat dairy products, such as skim or 1% milk, 2% or reduced-fat cheeses, low-fat ricotta or cottage cheese, or plain low-fat yogurt. Fats and Oils Tub margarines without trans fats. Light or reduced-fat mayonnaise and salad dressings. Avocado. Safflower, olive, or canola oils. Natural peanut or almond butter. The items listed above may not be a complete list of recommended foods or beverages. Contact your dietitian for more options. What foods are not recommended? Grains White bread. White pasta. White rice. Cornbread. Bagels, pastries, and croissants. Crackers that contain trans fat. Vegetables White potatoes. Corn. Creamed or fried vegetables. Vegetables in a cheese sauce. Fruits Dried fruits. Canned fruit in light or heavy syrup. Fruit juice. Meat and Other Protein Products Fatty cuts of meat. Ribs, chicken wings, bacon, sausage, bologna, salami, chitterlings, fatback, hot dogs, bratwurst, and packaged luncheon meats. Dairy Whole or 2% milk, cream, half-and-half, and cream cheese. Whole-fat or sweetened yogurt. Full-fat cheeses. Nondairy   creamers and whipped toppings. Processed cheese, cheese spreads, or cheese curds. Sweets and Desserts Corn syrup, sugars, honey, and molasses. Candy. Jam and jelly. Syrup. Sweetened cereals. Cookies, pies,  cakes, donuts, muffins, and ice cream. Fats and Oils Butter, stick margarine, lard, shortening, ghee, or bacon fat. Coconut, palm kernel, or palm oils. Beverages Alcohol. Sweetened drinks (such as sodas, lemonade, and fruit drinks or punches). The items listed above may not be a complete list of foods and beverages to avoid. Contact your dietitian for more information. This information is not intended to replace advice given to you by your health care provider. Make sure you discuss any questions you have with your health care provider. Document Released: 01/24/2004 Document Revised: 09/13/2015 Document Reviewed: 02/09/2013 Elsevier Interactive Patient Education  2017 Elsevier Inc.  

## 2017-11-14 LAB — COMPREHENSIVE METABOLIC PANEL
ALBUMIN: 4.6 g/dL (ref 3.5–5.5)
ALT: 43 IU/L (ref 0–44)
AST: 31 IU/L (ref 0–40)
Albumin/Globulin Ratio: 1.5 (ref 1.2–2.2)
Alkaline Phosphatase: 63 IU/L (ref 39–117)
BILIRUBIN TOTAL: 0.8 mg/dL (ref 0.0–1.2)
BUN/Creatinine Ratio: 18 (ref 9–20)
BUN: 16 mg/dL (ref 6–20)
CHLORIDE: 103 mmol/L (ref 96–106)
CO2: 22 mmol/L (ref 20–29)
CREATININE: 0.9 mg/dL (ref 0.76–1.27)
Calcium: 9.5 mg/dL (ref 8.7–10.2)
GFR calc Af Amer: 128 mL/min/{1.73_m2} (ref >59)
GFR calc non Af Amer: 111 mL/min/{1.73_m2} (ref >59)
GLUCOSE: 83 mg/dL (ref 65–99)
Globulin, Total: 3 g/dL (ref 1.5–4.5)
Potassium: 4.4 mmol/L (ref 3.5–5.2)
Sodium: 140 mmol/L (ref 134–144)
Total Protein: 7.6 g/dL (ref 6.0–8.5)

## 2017-11-14 LAB — CBC WITH DIFFERENTIAL/PLATELET
BASOS ABS: 0 10*3/uL (ref 0.0–0.2)
Basos: 1 %
EOS (ABSOLUTE): 0.1 10*3/uL (ref 0.0–0.4)
Eos: 1 %
HEMOGLOBIN: 15.4 g/dL (ref 13.0–17.7)
Hematocrit: 45.7 % (ref 37.5–51.0)
Immature Grans (Abs): 0 10*3/uL (ref 0.0–0.1)
Immature Granulocytes: 0 %
LYMPHS ABS: 1.9 10*3/uL (ref 0.7–3.1)
Lymphs: 38 %
MCH: 30.1 pg (ref 26.6–33.0)
MCHC: 33.7 g/dL (ref 31.5–35.7)
MCV: 89 fL (ref 79–97)
MONOCYTES: 8 %
Monocytes Absolute: 0.4 10*3/uL (ref 0.1–0.9)
Neutrophils Absolute: 2.6 10*3/uL (ref 1.4–7.0)
Neutrophils: 52 %
PLATELETS: 217 10*3/uL (ref 150–450)
RBC: 5.11 x10E6/uL (ref 4.14–5.80)
RDW: 12.9 % (ref 12.3–15.4)
WBC: 5 10*3/uL (ref 3.4–10.8)

## 2017-11-14 LAB — LIPID PANEL
CHOL/HDL RATIO: 3.7 ratio (ref 0.0–5.0)
Cholesterol, Total: 149 mg/dL (ref 100–199)
HDL: 40 mg/dL (ref >39)
LDL CALC: 97 mg/dL (ref 0–99)
Triglycerides: 62 mg/dL (ref 0–149)
VLDL CHOLESTEROL CAL: 12 mg/dL (ref 5–40)

## 2017-11-14 LAB — MICROALBUMIN / CREATININE URINE RATIO: Creatinine, Urine: 167 mg/dL

## 2017-11-14 LAB — HEMOGLOBIN A1C
Est. average glucose Bld gHb Est-mCnc: 108 mg/dL
HEMOGLOBIN A1C: 5.4 % (ref 4.8–5.6)

## 2017-11-27 ENCOUNTER — Telehealth: Payer: Self-pay | Admitting: Family Medicine

## 2017-11-27 NOTE — Telephone Encounter (Signed)
Called pt to reschedule their appt 02/11/18. Left VM

## 2017-12-28 ENCOUNTER — Ambulatory Visit: Payer: Managed Care, Other (non HMO) | Admitting: Allergy and Immunology

## 2018-01-11 ENCOUNTER — Ambulatory Visit: Payer: Managed Care, Other (non HMO) | Admitting: Allergy and Immunology

## 2018-01-11 DIAGNOSIS — J309 Allergic rhinitis, unspecified: Secondary | ICD-10-CM

## 2018-02-11 ENCOUNTER — Encounter: Payer: Managed Care, Other (non HMO) | Admitting: Urgent Care

## 2018-03-24 ENCOUNTER — Ambulatory Visit: Payer: Self-pay | Admitting: *Deleted

## 2018-03-24 NOTE — Telephone Encounter (Addendum)
No appointment available and with the current symptoms the patient is having- advised patient he needs to go to [facility] to get his symptoms checked- he could be anemic at this time. Patient states he is working until 10 pm- but will go in am. Advised to go ASAP.  Reason for Disposition . MILD rectal bleeding (more than just a few drops or streaks)  Answer Assessment - Initial Assessment Questions 1. APPEARANCE of BLOOD: "What color is it?" "Is it passed separately, on the surface of the stool, or mixed in with the stool?"      Bright red, surface and mixed with stool 2. AMOUNT: "How much blood was passed?"      Not a lot- does not change the color of the water in toilet 3. FREQUENCY: "How many times has blood been passed with the stools?"      Every other day- if patient is straining 4. ONSET: "When was the blood first seen in the stools?" (Days or weeks)      1-2 weeks 5. DIARRHEA: "Is there also some diarrhea?" If so, ask: "How many diarrhea stools were passed in past 24 hours?"      no 6. CONSTIPATION: "Do you have constipation?" If so, "How bad is it?"     Firm- but no constipation 7. RECURRENT SYMPTOMS: "Have you had blood in your stools before?" If so, ask: "When was the last time?" and "What happened that time?"      First time 8. BLOOD THINNERS: "Do you take any blood thinners?" (e.g., Coumadin/warfarin, Pradaxa/dabigatran, aspirin)     no 9. OTHER SYMPTOMS: "Do you have any other symptoms?"  (e.g., abdominal pain, vomiting, dizziness, fever)     Dizziness- patient is diabetic as well 10. PREGNANCY: "Is there any chance you are pregnant?" "When was your last menstrual period?"       n/a  Protocols used: RECTAL BLEEDING-A-AH

## 2018-05-28 ENCOUNTER — Other Ambulatory Visit: Payer: Self-pay

## 2018-05-28 ENCOUNTER — Ambulatory Visit (INDEPENDENT_AMBULATORY_CARE_PROVIDER_SITE_OTHER): Payer: Managed Care, Other (non HMO) | Admitting: Emergency Medicine

## 2018-05-28 ENCOUNTER — Encounter: Payer: Self-pay | Admitting: Emergency Medicine

## 2018-05-28 VITALS — BP 121/78 | HR 69 | Temp 98.6°F | Resp 16 | Ht 69.25 in | Wt 208.6 lb

## 2018-05-28 DIAGNOSIS — Z130 Encounter for screening for diseases of the blood and blood-forming organs and certain disorders involving the immune mechanism: Secondary | ICD-10-CM

## 2018-05-28 DIAGNOSIS — Z8639 Personal history of other endocrine, nutritional and metabolic disease: Secondary | ICD-10-CM | POA: Diagnosis not present

## 2018-05-28 DIAGNOSIS — Z23 Encounter for immunization: Secondary | ICD-10-CM

## 2018-05-28 DIAGNOSIS — Z1329 Encounter for screening for other suspected endocrine disorder: Secondary | ICD-10-CM

## 2018-05-28 DIAGNOSIS — Z0001 Encounter for general adult medical examination with abnormal findings: Secondary | ICD-10-CM

## 2018-05-28 DIAGNOSIS — J4520 Mild intermittent asthma, uncomplicated: Secondary | ICD-10-CM

## 2018-05-28 DIAGNOSIS — Z13220 Encounter for screening for lipoid disorders: Secondary | ICD-10-CM

## 2018-05-28 DIAGNOSIS — Z13228 Encounter for screening for other metabolic disorders: Secondary | ICD-10-CM

## 2018-05-28 DIAGNOSIS — Z0000 Encounter for general adult medical examination without abnormal findings: Secondary | ICD-10-CM

## 2018-05-28 DIAGNOSIS — E7800 Pure hypercholesterolemia, unspecified: Secondary | ICD-10-CM

## 2018-05-28 MED ORDER — BECLOMETHASONE DIPROP HFA 40 MCG/ACT IN AERB
1.0000 | INHALATION_SPRAY | Freq: Two times a day (BID) | RESPIRATORY_TRACT | 11 refills | Status: DC
Start: 2018-05-28 — End: 2020-05-15

## 2018-05-28 NOTE — Patient Instructions (Addendum)

## 2018-05-28 NOTE — Progress Notes (Signed)
Lab Results  Component Value Date   HGBA1C 5.4 11/13/2017   Alexander Osborne 38 y.o.   Chief Complaint  Patient presents with  . Annual Exam  . Establish Care    HISTORY OF PRESENT ILLNESS: This is a 38 y.o. male here for annual exam. Has a history of diabetes presently controlled with diet and exercise.  On no medications at present time. Also history of high triglycerides presently controlled with diet and exercise.  On no medications. Also has a history of asthma and has been using albuterol inhaler more frequently than usual, almost every night.  Allergic component.  Diagnosed in his early 13s. No other complaints or medical concerns today.  HPI   Prior to Admission medications   Medication Sig Start Date End Date Taking? Authorizing Provider  albuterol (PROVENTIL HFA;VENTOLIN HFA) 108 (90 Base) MCG/ACT inhaler Inhale 2 puffs into the lungs every 4 (four) hours as needed for wheezing or shortness of breath (cough, shortness of breath or wheezing.). 06/29/17  Yes Bobbitt, Sedalia Muta, MD  albuterol (PROVENTIL HFA;VENTOLIN HFA) 108 (90 Base) MCG/ACT inhaler Inhale 2 puffs into the lungs every 6 (six) hours as needed for wheezing or shortness of breath. 08/18/17  Yes Bobbitt, Sedalia Muta, MD  cetirizine (ZYRTEC) 10 MG tablet Take 1 tablet (10 mg total) by mouth daily. 07/21/17  Yes Alexander Eagles, PA-C  glucose blood test strip Check sugar once daily  Dx: DMII controlled, non-insulin, without complications 04/23/13  Yes Alexander Honour, MD  glucose monitoring kit (FREESTYLE) monitoring kit 1 each by Does not apply route as needed for other. Dx: DMII non-insulin dependent without complications; controlled; 05/04/17  Yes Alexander Honour, MD  Lancets MISC Check sugar once daily dx DMII controlled, non-insulin, without complications 9/45/85  Yes Alexander Honour, MD  levocetirizine (XYZAL) 5 MG tablet Take 5 mg by mouth as needed for allergies.   Yes [provider]  loratadine  (CLARITIN) 10 MG tablet Take 10 mg by mouth as needed for allergies.   Yes [provider]  mometasone (NASONEX) 50 MCG/ACT nasal spray Place 2 sprays into the nose daily as needed. 06/29/17  Yes Bobbitt, Sedalia Muta, MD  prednisoLONE acetate (PRED FORTE) 1 % ophthalmic suspension 1 drop 4 (four) times daily.    [provider]    Allergies  Allergen Reactions  . Shellfish Allergy     Patient Active Problem List   Diagnosis Date Noted  . Food allergy 06/29/2017  . Perennial and seasonal allergic rhinitis 06/29/2017  . Pruritus 06/29/2017  . Diabetes ([place]) 05/22/2017  . Mild intermittent asthma 02/07/2014    Past Medical History:  Diagnosis Date  . Allergy   . Asthma     Past Surgical History:  Procedure Laterality Date  . corneal transplant left    . EYE SURGERY      Social History   Socioeconomic History  . Marital status: Married    Spouse name: Not on file  . Number of children: 0  . Years of education: Not on file  . Highest education level: Not on file  Occupational History  . Occupation: Best boy: [employer]  . Financial resource strain: Not on file  . Food insecurity:    Worry: Not on file    Inability: Not on file  . Transportation needs:    Medical: Not on file    Non-medical: Not on file  Tobacco Use  . Smoking status:  Current Every Day Smoker    Packs/day: 0.25    Years: 11.00    Pack years: 2.75    Types: Cigarettes  . Smokeless tobacco: Never Used  Substance and Sexual Activity  . Alcohol use: Yes    Alcohol/week: 1.0 standard drinks    Types: 1 Standard drinks or equivalent per week  . Drug use: No  . Sexual activity: Yes    Partners: Male    Birth control/protection: None    Comment: SS partner  Lifestyle  . Physical activity:    Days per week: Not on file    Minutes per session: Not on file  . Stress: Not on file  Relationships  . Social connections:    Talks on phone: Not on file      Gets together: Not on file    Attends religious service: Not on file    Active member of club or organization: Not on file    Attends meetings of clubs or organizations: Not on file    Relationship status: Not on file  . Intimate partner violence:    Fear of current or ex partner: Not on file    Emotionally abused: Not on file    Physically abused: Not on file    Forced sexual activity: Not on file  Other Topics Concern  . Not on file  Social History Narrative   Marital status: married x 4 years; happily married.       Lives; with husbnad      Employment: Librarian, academic at [employer]: socially; weekends      Alcohol: 1-3 drinks per week      Drugs: none      Exercise: none   Denies caffeine use     Family History  Problem Relation Age of Onset  . Cancer Mother 38       pancreatic cancer  . Hypertension Father   . Heart disease Father 31       CAD  . Bipolar disorder Brother   . Diabetes Sister   . Allergic rhinitis Neg Hx   . Angioedema Neg Hx   . Asthma Neg Hx   . Eczema Neg Hx   . Immunodeficiency Neg Hx   . Urticaria Neg Hx      Review of Systems  Constitutional: Negative.  Negative for chills and fever.  HENT: Negative.  Negative for hearing loss and nosebleeds.   Eyes: Negative.  Negative for blurred vision and double vision.  Respiratory: Positive for cough, shortness of breath and wheezing.   Cardiovascular: Negative.  Negative for chest pain and palpitations.  Gastrointestinal: Negative.  Negative for abdominal pain, blood in stool, diarrhea, melena, nausea and vomiting.  Genitourinary: Negative.   Musculoskeletal: Negative.   Skin: Negative.  Negative for rash.  Neurological: Positive for sensory change (Left arm intermittent numbness affected by position).  All other systems reviewed and are negative.   Vitals:   05/28/18 1359  BP: 121/78  Pulse: 69  Resp: 16  Temp: 98.6 F (37 C)  SpO2: 97%    Physical Exam Vitals  signs reviewed.  Constitutional:      Appearance: Normal appearance.  HENT:     Head: Normocephalic and atraumatic.     Mouth/Throat:     Mouth: Mucous membranes are moist.     Pharynx: Oropharynx is clear.  Eyes:     Extraocular Movements: Extraocular movements intact.     Conjunctiva/sclera: Conjunctivae normal.  Pupils: Pupils are equal, round, and reactive to light.  Neck:     Musculoskeletal: Normal range of motion and neck supple.  Cardiovascular:     Rate and Rhythm: Normal rate and regular rhythm.     Heart sounds: Normal heart sounds.  Pulmonary:     Effort: Pulmonary effort is normal.     Breath sounds: Normal breath sounds.  Abdominal:     General: There is no distension.     Palpations: Abdomen is soft. There is no mass.     Tenderness: There is no abdominal tenderness.  Musculoskeletal: Normal range of motion.  Skin:    General: Skin is warm and dry.     Capillary Refill: Capillary refill takes less than 2 seconds.  Neurological:     General: No focal deficit present.     Mental Status: He is alert and oriented to person, place, and time.  Psychiatric:        Mood and Affect: Mood normal.        Behavior: Behavior normal.      ASSESSMENT & PLAN: Alexander Osborne was seen today for annual exam and establish care.  Diagnoses and all orders for this visit:  Routine general medical examination at a health care facility -     Comprehensive metabolic panel  Mild intermittent asthma without complication -     beclomethasone (QVAR REDIHALER) 40 MCG/ACT inhaler; Inhale 1 puff into the lungs 2 (two) times daily.  History of diabetes mellitus, type II -     Hemoglobin A1c -     Comprehensive metabolic panel  Pure hypercholesterolemia -     Lipid panel -     Comprehensive metabolic panel  Screening for deficiency anemia -     CBC with Differential  Screening for lipoid disorders  Screening for endocrine, metabolic and immunity disorder  Need for 23-polyvalent  pneumococcal polysaccharide vaccine -     Pneumococcal polysaccharide vaccine 23-valent greater than or equal to 2yo subcutaneous/IM  Need for prophylactic vaccination and inoculation against influenza -     Flu Vaccine QUAD 36+ mos IM    Patient Instructions       If you have lab work done today you will be contacted with your lab results within the next 2 weeks.  If you have not heard from [place] then please contact us. The fastest way to get your results is to register for My Chart.   IF you received an x-ray today, you will receive an invoice from [facility]. Please contact [facility] at [phone] with questions or concerns regarding your invoice.   IF you received labwork today, you will receive an invoice from [facility]. Please contact [facility] at [phone] with questions or concerns regarding your invoice.   Our billing staff will not be able to assist you with questions regarding bills from these companies.  You will be contacted with the lab results as soon as they are available. The fastest way to get your results is to activate your My Chart account. Instructions are located on the last page of this paperwork. If you have not heard from [place] regarding the results in 2 weeks, please contact this office.      Health Maintenance, Male A healthy lifestyle and preventive care is important for your health and wellness. Ask your health care provider about what schedule of regular examinations is right for you. What should I know about weight and diet? Eat a Healthy Diet  Eat plenty of vegetables,  fruits, whole grains, low-fat dairy products, and lean protein.  Do not eat a lot of foods high in solid fats, added sugars, or salt.  Maintain a Healthy Weight Regular exercise can help you achieve or maintain a healthy weight. You should:  Do at least 150 minutes of exercise each week. The exercise should increase your heart rate and make you sweat  (moderate-intensity exercise).  Do strength-training exercises at least twice a week. Watch Your Levels of Cholesterol and Blood Lipids  Have your blood tested for lipids and cholesterol every 5 years starting at 36 years of age. If you are at high risk for heart disease, you should start having your blood tested when you are 36 years old. You may need to have your cholesterol levels checked more often if: ? Your lipid or cholesterol levels are high. ? You are older than 36 years of age. ? You are at high risk for heart disease. What should I know about cancer screening? Many types of cancers can be detected early and may often be prevented. Lung Cancer  You should be screened every year for lung cancer if: ? You are a current smoker who has smoked for at least 30 years. ? You are a former smoker who has quit within the past 15 years.  Talk to your health care provider about your screening options, when you should start screening, and how often you should be screened. Colorectal Cancer  Routine colorectal cancer screening usually begins at 36 years of age and should be repeated every 5-10 years until you are 36 years old. You may need to be screened more often if early forms of precancerous polyps or small growths are found. Your health care provider may recommend screening at an earlier age if you have risk factors for colon cancer.  Your health care provider may recommend using home test kits to check for hidden blood in the stool.  A small camera at the end of a tube can be used to examine your colon (sigmoidoscopy or colonoscopy). This checks for the earliest forms of colorectal cancer. Prostate and Testicular Cancer  Depending on your age and overall health, your health care provider may do certain tests to screen for prostate and testicular cancer.  Talk to your health care provider about any symptoms or concerns you have about testicular or prostate cancer. Skin Cancer  Check  your skin from head to toe regularly.  Tell your health care provider about any new moles or changes in moles, especially if: ? There is a change in a mole's size, shape, or color. ? You have a mole that is larger than a pencil eraser.  Always use sunscreen. Apply sunscreen liberally and repeat throughout the day.  Protect yourself by wearing long sleeves, pants, a wide-brimmed hat, and sunglasses when outside. What should I know about heart disease, diabetes, and high blood pressure?  If you are 63-21 years of age, have your blood pressure checked every 3-5 years. If you are 99 years of age or older, have your blood pressure checked every year. You should have your blood pressure measured twice-once when you are at a hospital or clinic, and once when you are not at a hospital or clinic. Record the average of the two measurements. To check your blood pressure when you are not at a hospital or clinic, you can use: ? An automated blood pressure machine at a pharmacy. ? A home blood pressure monitor.  Talk to your health care  provider about your target blood pressure.  If you are between 18-68 years old, ask your health care provider if you should take aspirin to prevent heart disease.  Have regular diabetes screenings by checking your fasting blood sugar level. ? If you are at a normal weight and have a low risk for diabetes, have this test once every three years after the age of 27. ? If you are overweight and have a high risk for diabetes, consider being tested at a younger age or more often.  A one-time screening for abdominal aortic aneurysm (AAA) by ultrasound is recommended for men aged 52-75 years who are current or former smokers. What should I know about preventing infection? Hepatitis B If you have a higher risk for hepatitis B, you should be screened for this virus. Talk with your health care provider to find out if you are at risk for hepatitis B infection. Hepatitis C Blood  testing is recommended for:  Everyone born from [birth year] through 1963.  Anyone with known risk factors for hepatitis C. Sexually Transmitted Diseases (STDs)  You should be screened each year for STDs including gonorrhea and chlamydia if: ? You are sexually active and are younger than 36 years of age. ? You are older than 36 years of age and your health care provider tells you that you are at risk for this type of infection. ? Your sexual activity has changed since you were last screened and you are at an increased risk for chlamydia or gonorrhea. Ask your health care provider if you are at risk.  Talk with your health care provider about whether you are at high risk of being infected with HIV. Your health care provider may recommend a prescription medicine to help prevent HIV infection. What else can I do?  Schedule regular health, dental, and eye exams.  Stay current with your vaccines (immunizations).  Do not use any tobacco products, such as cigarettes, chewing tobacco, and e-cigarettes. If you need help quitting, ask your health care provider.  Limit alcohol intake to no more than 2 drinks per day. One drink equals 12 ounces of beer, 5 ounces of wine, or 1 ounces of hard liquor.  Do not use street drugs.  Do not share needles.  Ask your health care provider for help if you need support or information about quitting drugs.  Tell your health care provider if you often feel depressed.  Tell your health care provider if you have ever been abused or do not feel safe at home. This information is not intended to replace advice given to you by your health care provider. Make sure you discuss any questions you have with your health care provider. Document Released: 10/04/2007 Document Revised: 12/05/2015 Document Reviewed: 01/09/2015 Elsevier Interactive Patient Education  2019 Elsevier Inc.      Agustina Caroli, MD [facility]

## 2018-05-29 LAB — LIPID PANEL
Chol/HDL Ratio: 4.8 ratio (ref 0.0–5.0)
Cholesterol, Total: 168 mg/dL (ref 100–199)
HDL: 35 mg/dL — ABNORMAL LOW (ref >39)
LDL Calculated: 96 mg/dL (ref 0–99)
TRIGLYCERIDES: 187 mg/dL — AB (ref 0–149)
VLDL CHOLESTEROL CAL: 37 mg/dL (ref 5–40)

## 2018-05-29 LAB — COMPREHENSIVE METABOLIC PANEL
A/G RATIO: 2 (ref 1.2–2.2)
ALBUMIN: 5.2 g/dL — AB (ref 4.0–5.0)
ALT: 27 IU/L (ref 0–44)
AST: 27 IU/L (ref 0–40)
Alkaline Phosphatase: 69 IU/L (ref 39–117)
BUN / CREAT RATIO: 18 (ref 9–20)
BUN: 17 mg/dL (ref 6–20)
Bilirubin Total: 0.4 mg/dL (ref 0.0–1.2)
CO2: 19 mmol/L — ABNORMAL LOW (ref 20–29)
Calcium: 9.6 mg/dL (ref 8.7–10.2)
Chloride: 99 mmol/L (ref 96–106)
Creatinine, Ser: 0.93 mg/dL (ref 0.76–1.27)
GFR calc non Af Amer: 106 mL/min/{1.73_m2} (ref >59)
GFR, EST AFRICAN AMERICAN: 123 mL/min/{1.73_m2} (ref >59)
Globulin, Total: 2.6 g/dL (ref 1.5–4.5)
Glucose: 72 mg/dL (ref 65–99)
POTASSIUM: 4.4 mmol/L (ref 3.5–5.2)
SODIUM: 138 mmol/L (ref 134–144)
TOTAL PROTEIN: 7.8 g/dL (ref 6.0–8.5)

## 2018-05-29 LAB — CBC WITH DIFFERENTIAL/PLATELET
BASOS ABS: 0 10*3/uL (ref 0.0–0.2)
Basos: 1 %
EOS (ABSOLUTE): 0.2 10*3/uL (ref 0.0–0.4)
EOS: 3 %
HEMATOCRIT: 47.5 % (ref 37.5–51.0)
Hemoglobin: 16.2 g/dL (ref 13.0–17.7)
IMMATURE GRANS (ABS): 0 10*3/uL (ref 0.0–0.1)
Immature Granulocytes: 0 %
LYMPHS: 30 %
Lymphocytes Absolute: 2.4 10*3/uL (ref 0.7–3.1)
MCH: 30.3 pg (ref 26.6–33.0)
MCHC: 34.1 g/dL (ref 31.5–35.7)
MCV: 89 fL (ref 79–97)
MONOCYTES: 8 %
Monocytes Absolute: 0.6 10*3/uL (ref 0.1–0.9)
NEUTROS ABS: 4.8 10*3/uL (ref 1.4–7.0)
Neutrophils: 58 %
Platelets: 213 10*3/uL (ref 150–450)
RBC: 5.34 x10E6/uL (ref 4.14–5.80)
RDW: 13 % (ref 11.6–15.4)
WBC: 8 10*3/uL (ref 3.4–10.8)

## 2018-05-29 LAB — HEMOGLOBIN A1C
ESTIMATED AVERAGE GLUCOSE: 108 mg/dL
HEMOGLOBIN A1C: 5.4 % (ref 4.8–5.6)

## 2018-06-26 ENCOUNTER — Other Ambulatory Visit: Payer: Self-pay | Admitting: Allergy and Immunology

## 2018-06-29 ENCOUNTER — Ambulatory Visit: Payer: Self-pay

## 2018-06-29 ENCOUNTER — Telehealth: Payer: Self-pay

## 2018-06-29 NOTE — Telephone Encounter (Signed)
Prior Auth was completed and approved 06/29/2018 for Mometasone Furoate 50MCG/ACT

## 2018-07-05 ENCOUNTER — Telehealth: Payer: Self-pay

## 2018-07-05 NOTE — Telephone Encounter (Signed)
PA initiated for Nasacort and pending.

## 2018-07-06 NOTE — Telephone Encounter (Signed)
PA for Nasacort has been approved. PA form has been faxed to the patient's pharmacy, labeled, and placed in bulk scanning.

## 2018-07-26 ENCOUNTER — Other Ambulatory Visit: Payer: Self-pay | Admitting: Allergy and Immunology

## 2018-07-26 DIAGNOSIS — J4520 Mild intermittent asthma, uncomplicated: Secondary | ICD-10-CM

## 2018-07-26 NOTE — Telephone Encounter (Signed)
Courtesy Refill

## 2018-08-06 LAB — HM DIABETES EYE EXAM

## 2018-09-07 ENCOUNTER — Other Ambulatory Visit: Payer: Self-pay | Admitting: Allergy and Immunology

## 2018-09-07 DIAGNOSIS — J4520 Mild intermittent asthma, uncomplicated: Secondary | ICD-10-CM

## 2018-09-14 ENCOUNTER — Other Ambulatory Visit: Payer: Self-pay

## 2018-09-14 ENCOUNTER — Encounter: Payer: Self-pay | Admitting: Allergy and Immunology

## 2018-09-14 ENCOUNTER — Ambulatory Visit (INDEPENDENT_AMBULATORY_CARE_PROVIDER_SITE_OTHER): Payer: Managed Care, Other (non HMO) | Admitting: Allergy and Immunology

## 2018-09-14 DIAGNOSIS — H1013 Acute atopic conjunctivitis, bilateral: Secondary | ICD-10-CM

## 2018-09-14 DIAGNOSIS — J4520 Mild intermittent asthma, uncomplicated: Secondary | ICD-10-CM | POA: Diagnosis not present

## 2018-09-14 DIAGNOSIS — T7800XD Anaphylactic reaction due to unspecified food, subsequent encounter: Secondary | ICD-10-CM

## 2018-09-14 DIAGNOSIS — J3089 Other allergic rhinitis: Secondary | ICD-10-CM | POA: Diagnosis not present

## 2018-09-14 DIAGNOSIS — H1010 Acute atopic conjunctivitis, unspecified eye: Secondary | ICD-10-CM | POA: Insufficient documentation

## 2018-09-14 MED ORDER — PROAIR HFA 108 (90 BASE) MCG/ACT IN AERS: INHALATION_SPRAY | RESPIRATORY_TRACT | 1 refills | Status: DC

## 2018-09-14 NOTE — Assessment & Plan Note (Signed)
   Treatment plan as outlined above for allergic rhinitis.  Continue Zaditor, 1 drop per eye twice daily as needed.  Eye lubricant drops (i.e., Natural Tears) if needed.

## 2018-09-14 NOTE — Patient Instructions (Signed)
Mild intermittent asthma  Continue albuterol HFA, 1-2 inhalations every 6 hours if needed and 10-15 minutes prior to vigorous exercise.  A refill prescription has been provided for albuterol HFA.  Subjective and objective measures of pulmonary function will be followed and the treatment plan will be adjusted accordingly.  Perennial and seasonal allergic rhinitis  Continue with allergen avoidance measures and levocetirizine 5 mg daily if needed.  Add Flonase or Nasacort AQ if needed.  Nasal saline spray (i.e., Simply Saline) or nasal saline lavage (i.e., NeilMed) is recommended as needed and prior to medicated nasal sprays.  If allergen avoidance measures and medications fail to adequately relieve symptoms, aeroallergen immunotherapy will be considered.  Allergic conjunctivitis  Treatment plan as outlined above for allergic rhinitis.  Continue Zaditor, 1 drop per eye twice daily as needed.  Eye lubricant drops (i.e., Natural Tears) if needed.  Food allergy  Continue meticulous avoidance of shellfish and have access to epinephrine autoinjector 2 pack in case of accidental ingestion.   Return in about 1 year (around 09/14/2019), or if symptoms worsen or fail to improve.

## 2018-09-14 NOTE — Assessment & Plan Note (Signed)
   Continue albuterol HFA, 1-2 inhalations every 6 hours if needed and 10-15 minutes prior to vigorous exercise.  A refill prescription has been provided for albuterol HFA.  Subjective and objective measures of pulmonary function will be followed and the treatment plan will be adjusted accordingly. 

## 2018-09-14 NOTE — Assessment & Plan Note (Signed)
   Continue with allergen avoidance measures and levocetirizine 5 mg daily if needed.  Add Flonase or Nasacort AQ if needed.  Nasal saline spray (i.e., Simply Saline) or nasal saline lavage (i.e., NeilMed) is recommended as needed and prior to medicated nasal sprays.  If allergen avoidance measures and medications fail to adequately relieve symptoms, aeroallergen immunotherapy will be considered. 

## 2018-09-14 NOTE — Assessment & Plan Note (Signed)
   Continue meticulous avoidance of shellfish and have access to epinephrine autoinjector 2 pack in case of accidental ingestion. 

## 2018-09-14 NOTE — Progress Notes (Signed)
Follow-up Telemedicine Note  RE: Alexander Osborne MRN: [id number] DOB: [birth year] Date of Telemedicine Visit: 09/14/2018  Primary care provider: Horald Pollen, MD Referring provider: Horald Pollen, *  Telemedicine Follow Up Visit via Telephone: I connected with Alexander Osborne for a follow up on 09/14/18 by telephone and verified that I am speaking with the correct person using two identifiers.   The limitations, risks, security and privacy concerns of performing an evaluation and management service by telemedicine, the availability of in person appointments, and that there may be a patient responsible charge related to this service were discussed. The patient expressed understanding and agreed to proceed.  Patient is at home.  Provider is at the office.  Visit start time: 1:30 pm Visit end time: 1:44 pm Insurance consent/check in by: Anderson Malta Medical consent and medical assistant/nurse: Caryl Pina  History of present illness: Alexander Osborne is a 38 y.o. male with intermittent asthma, allergic rhinitis, and food allergy presenting today for follow-up.  He was previously seen in this clinic for his initial evaluation in March 2019.  He reports that he has been doing "cardio" workouts and has been using albuterol prior to those workouts with benefit.  He reports that if he does not use the albuterol prior to vigorous exercise he feels "heavy chested."  He has not required albuterol for rescue purposes other than with vigorous exercise if he does not take the albuterol prior to working out.  He experienced some nasal allergy symptoms a few weeks ago but levocetirizine and Zaditor eyedrops have kept the symptoms "under control for the most part."  He avoids shellfish and has not had accidental ingestion of shellfish in the interval since his previous visit.  He reports that he has access to epinephrine autoinjectors.  Assessment and plan: Mild intermittent asthma   Continue albuterol HFA, 1-2 inhalations every 6 hours if needed and 10-15 minutes prior to vigorous exercise.  A refill prescription has been provided for albuterol HFA.  Subjective and objective measures of pulmonary function will be followed and the treatment plan will be adjusted accordingly.  Perennial and seasonal allergic rhinitis  Continue with allergen avoidance measures and levocetirizine 5 mg daily if needed.  Add Flonase or Nasacort AQ if needed.  Nasal saline spray (i.e., Simply Saline) or nasal saline lavage (i.e., NeilMed) is recommended as needed and prior to medicated nasal sprays.  If allergen avoidance measures and medications fail to adequately relieve symptoms, aeroallergen immunotherapy will be considered.  Allergic conjunctivitis  Treatment plan as outlined above for allergic rhinitis.  Continue Zaditor, 1 drop per eye twice daily as needed.  Eye lubricant drops (i.e., Natural Tears) if needed.  Food allergy  Continue meticulous avoidance of shellfish and have access to epinephrine autoinjector 2 pack in case of accidental ingestion.   Meds ordered this encounter  Medications  . PROAIR HFA 108 (90 Base) MCG/ACT inhaler    Sig: INHALE 2 PUFFS INTO THE LUNGS EVERY 4 HOURS AS NEEDED FOR WHEEZING OR SHORTNESS OF BREATH OR COUGH    Dispense:  8.5 g    Refill:  1    Diagnostics: None.   Physical examination: Physical Exam Not obtained as encounter was done via telephone.   The following portions of the patient's history were reviewed and updated as appropriate: allergies, current medications, past family history, past medical history, past social history, past surgical history and problem list.  Allergies as of 09/14/2018      Reactions  Shellfish Allergy       Medication List       Accurate as of Sep 14, 2018  1:52 PM. If you have any questions, ask your nurse or doctor.        STOP taking these medications   cetirizine 10 MG tablet  Commonly known as:  ZYRTEC Stopped by:  Edmonia Lynch, MD   Claritin 10 MG tablet Generic drug:  loratadine Stopped by:  Edmonia Lynch, MD   mometasone 50 MCG/ACT nasal spray Commonly known as:  NASONEX Stopped by:  Edmonia Lynch, MD   prednisoLONE acetate 1 % ophthalmic suspension Commonly known as:  PRED FORTE Stopped by:  Edmonia Lynch, MD     TAKE these medications   beclomethasone 40 MCG/ACT inhaler Commonly known as:  Qvar RediHaler Inhale 1 puff into the lungs 2 (two) times daily.   glucose blood test strip Check sugar once daily  Dx: DMII controlled, non-insulin, without complications   glucose monitoring kit monitoring kit 1 each by Does not apply route as needed for other. 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   ProAir HFA 108 (90 Base) MCG/ACT inhaler Generic drug:  albuterol INHALE 2 PUFFS INTO THE LUNGS EVERY 4 HOURS AS NEEDED FOR WHEEZING OR SHORTNESS OF BREATH OR COUGH What changed:  Another medication with the same name was removed. Continue taking this medication, and follow the directions you see here. Changed by:  Edmonia Lynch, MD       Allergies  Allergen Reactions  . Shellfish Allergy     Previous notes and tests were reviewed.  I discussed the assessment and treatment plan with the patient. The patient was provided an opportunity to ask questions and all were answered. The patient agreed with the plan and demonstrated an understanding of the instructions.   The patient was advised to call back or seek an in-person evaluation if the symptoms worsen or if the condition fails to improve as anticipated.  I provided 14 minutes of non-face-to-face time during this encounter.  I appreciate the opportunity to take  part in Augusten's care. Please do not hesitate to contact me with questions.  Sincerely,   R. Edgar Frisk, MD

## 2018-09-25 ENCOUNTER — Encounter (HOSPITAL_COMMUNITY): Payer: Self-pay | Admitting: *Deleted

## 2018-09-25 ENCOUNTER — Other Ambulatory Visit: Payer: Self-pay

## 2018-09-25 ENCOUNTER — Emergency Department (HOSPITAL_COMMUNITY): Payer: Managed Care, Other (non HMO)

## 2018-09-25 ENCOUNTER — Emergency Department (HOSPITAL_COMMUNITY)
Admission: EM | Admit: 2018-09-25 | Discharge: 2018-09-25 | Disposition: A | Discharge status: Home / Self Care | Payer: Managed Care, Other (non HMO) | Attending: Emergency Medicine | Admitting: Emergency Medicine

## 2018-09-25 DIAGNOSIS — W271XXA Contact with garden tool, initial encounter: Secondary | ICD-10-CM | POA: Insufficient documentation

## 2018-09-25 DIAGNOSIS — Y999 Unspecified external cause status: Secondary | ICD-10-CM | POA: Insufficient documentation

## 2018-09-25 DIAGNOSIS — E119 Type 2 diabetes mellitus without complications: Secondary | ICD-10-CM | POA: Insufficient documentation

## 2018-09-25 DIAGNOSIS — Y92017 Garden or yard in single-family (private) house as the place of occurrence of the external cause: Secondary | ICD-10-CM | POA: Diagnosis not present

## 2018-09-25 DIAGNOSIS — Y93H9 Activity, other involving exterior property and land maintenance, building and construction: Secondary | ICD-10-CM | POA: Diagnosis not present

## 2018-09-25 DIAGNOSIS — J45909 Unspecified asthma, uncomplicated: Secondary | ICD-10-CM | POA: Diagnosis not present

## 2018-09-25 DIAGNOSIS — S81812A Laceration without foreign body, left lower leg, initial encounter: Secondary | ICD-10-CM

## 2018-09-25 DIAGNOSIS — Z79899 Other long term (current) drug therapy: Secondary | ICD-10-CM | POA: Insufficient documentation

## 2018-09-25 DIAGNOSIS — F17210 Nicotine dependence, cigarettes, uncomplicated: Secondary | ICD-10-CM | POA: Insufficient documentation

## 2018-09-25 DIAGNOSIS — S8992XA Unspecified injury of left lower leg, initial encounter: Secondary | ICD-10-CM | POA: Diagnosis present

## 2018-09-25 HISTORY — DX: Type 2 diabetes mellitus without complications: E11.9

## 2018-09-25 LAB — CBG MONITORING, ED: Glucose-Capillary: 88 mg/dL (ref 70–99)

## 2018-09-25 MED ORDER — ACETAMINOPHEN 325 MG PO TABS
650.0000 mg | ORAL_TABLET | Freq: Once | ORAL | Status: AC
  Administered 2018-09-25: 650 mg via ORAL
  Filled 2018-09-25: qty 2

## 2018-09-25 MED ORDER — LIDOCAINE HCL (PF) 1 % IJ SOLN
5.0000 mL | Freq: Once | INTRAMUSCULAR | Status: AC
  Administered 2018-09-25: 5 mL via INTRADERMAL
  Filled 2018-09-25: qty 5

## 2018-09-25 NOTE — ED Provider Notes (Signed)
[facility] EMERGENCY DEPARTMENT Provider Note   CSN: [id number] Arrival date & time: 09/25/18  1353    History   Chief Complaint Chief Complaint  Patient presents with  . Laceration    HPI Alexander Osborne is a 38 y.o. male.     38 y.o male with a PMH of DM, Asthma presents to the ED s/p left leg injury x today. Patient reports he was working on the yard when he swoon the machete against his left leg. He reports applying pressure to the area, he has not taken any medication for relieve in symptoms. Patient reports his tetanus vaccine was updated recently, according to his chart which I have reviewed his last vaccine was updated in 2019. He denies any weakness, numbness or other injuries.      Past Medical History:  Diagnosis Date  . Allergy   . Asthma   . Diabetes mellitus without complication [facility])     Patient Active Problem List   Diagnosis Date Noted  . Allergic conjunctivitis 09/14/2018  . Food allergy 06/29/2017  . Perennial and seasonal allergic rhinitis 06/29/2017  . Pruritus 06/29/2017  . Diabetes ([place]) 05/22/2017  . Mild intermittent asthma 02/07/2014    Past Surgical History:  Procedure Laterality Date  . corneal transplant left    . EYE SURGERY          Home Medications    Prior to Admission medications   Medication Sig Start Date End Date Taking? Authorizing Provider  beclomethasone (QVAR REDIHALER) 40 MCG/ACT inhaler Inhale 1 puff into the lungs 2 (two) times daily. 05/28/18  Yes Sagardia, Ines Bloomer, MD  levocetirizine (XYZAL) 5 MG tablet Take 5 mg by mouth as needed for allergies.   Yes [provider]  PROAIR HFA 108 (90 Base) MCG/ACT inhaler INHALE 2 PUFFS INTO THE LUNGS EVERY 4 HOURS AS NEEDED FOR WHEEZING OR SHORTNESS OF BREATH OR COUGH Patient taking differently: Inhale 2 puffs into the lungs every 4 (four) hours as needed for wheezing or shortness of breath.  09/14/18  Yes Bobbitt, Sedalia Muta, MD  glucose  blood test strip Check sugar once daily  Dx: DMII controlled, non-insulin, without complications 11/04/94   Wardell Honour, MD  glucose monitoring kit (FREESTYLE) monitoring kit 1 each by Does not apply route as needed for other. 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 Cancer Mother 72       pancreatic cancer  . Hypertension Father   . Heart disease Father 67       CAD  . Bipolar disorder Brother   . Diabetes Sister   . Allergic rhinitis Neg Hx   . Angioedema Neg Hx   . Asthma Neg Hx   . Eczema Neg Hx   . Immunodeficiency Neg Hx   . Urticaria Neg Hx     Social History Social History   Tobacco Use  . Smoking status: Current Every Day Smoker    Packs/day: 0.25    Years: 11.00    Pack years: 2.75    Types: Cigarettes  . Smokeless tobacco: Never Used  Substance Use Topics  . Alcohol use: Yes    Alcohol/week: 1.0 standard drinks    Types: 1 Standard drinks or equivalent per week  .  Drug use: No     Allergies   Shellfish allergy   Review of Systems Review of Systems  Constitutional: Negative for fever.  Cardiovascular: Negative for chest pain.  Musculoskeletal: Negative for back pain.  Skin: Positive for wound.     Physical Exam Updated Vital Signs BP (!) 134/98 (BP Location: Right Arm)   Pulse 84   Temp 98.8 F (37.1 C) (Oral)   Resp 18   Ht 5' 9.5" (1.765 m)   Wt 98.9 kg   SpO2 97%   BMI 31.73 kg/m   Physical Exam Vitals signs and nursing note reviewed.  Constitutional:      Appearance: Normal appearance. He is well-developed.     Comments: Non ill appearing.   HENT:     Head: Normocephalic and atraumatic.  Eyes:     General: No scleral icterus.    Pupils: Pupils are equal, round, and reactive to light.   Neck:     Musculoskeletal: Normal range of motion.  Cardiovascular:     Heart sounds: Normal heart sounds.  Pulmonary:     Effort: Pulmonary effort is normal.     Breath sounds: Normal breath sounds. No wheezing.     Comments: Lungs are cleared to auscultation.  Chest:     Chest wall: No tenderness.  Abdominal:     General: Bowel sounds are normal. There is no distension.     Palpations: Abdomen is soft.     Tenderness: There is no abdominal tenderness.  Musculoskeletal:        General: No tenderness or deformity.       Legs:  Skin:    General: Skin is warm and dry.  Neurological:     Mental Status: He is alert and oriented to person, place, and time.      ED Treatments / Results  Labs (all labs ordered are listed, but only abnormal results are displayed) Labs Reviewed  CBG MONITORING, ED    EKG None  Radiology Dg Tibia/fibula Left  Result Date: 09/25/2018 CLINICAL DATA:  Machete injury to left leg while doing yard work today. EXAM: LEFT TIBIA AND FIBULA - 2 VIEW COMPARISON:  None. FINDINGS: There is no evidence of fracture or other focal bone lesions. Soft tissues are unremarkable. IMPRESSION: Negative. Electronically Signed   By: Marin Olp M.D.   On: 09/25/2018 15:12    Procedures .Marland KitchenLaceration Repair Date/Time: 09/25/2018 3:11 PM Performed by: Janeece Fitting, PA-C Authorized by: Janeece Fitting, PA-C   Consent:    Consent obtained:  Verbal   Consent given by:  Patient   Risks discussed:  Infection, pain and poor cosmetic result   Alternatives discussed:  No treatment Anesthesia (see MAR for exact dosages):    Anesthesia method:  None Laceration details:    Location:  Leg   Leg location:  L lower leg   Length (cm):  2   Depth (mm):  1 Repair type:    Repair type:  Simple Exploration:    Hemostasis achieved with:  Direct pressure Treatment:    Amount of cleaning:  Standard   Irrigation solution:  Sterile saline   Irrigation method:  Pressure wash  Mucous membrane repair:    Suture size:  4-0   Suture technique:  Horizontal mattress   Number of sutures:  2 Skin repair:    Repair method:  Sutures   Suture size:  4-0   Suture material:  Prolene Approximation:    Approximation:  Close Post-procedure details:  Dressing:  Open (no dressing)   Patient tolerance of procedure:  Tolerated well, no immediate complications   (including critical care time)  Medications Ordered in ED Medications  lidocaine (PF) (XYLOCAINE) 1 % injection 5 mL (5 mLs Intradermal Given by Other 09/25/18 1439)  acetaminophen (TYLENOL) tablet 650 mg (650 mg Oral Given 09/25/18 1438)     Initial Impression / Assessment and Plan / ED Course  I have reviewed the triage vital signs and the nursing notes.  Pertinent labs & imaging results that were available during my care of the patient were reviewed by me and considered in my medical decision making (see chart for details).     Patient with a past medical history of diabetes along with asthma presents to the ED status post left leg injury while cleaning his yard with a machete.  During evaluation there is a 2 cm laceration to the left tibial region, this seems to be about 1.5 mm in depth.  Patient reports his last tetanus shot is with within last year, according to his chart which I have reviewed his last tetanus vaccine was updated on 04/29/2017, we will not be updating this today.  X-ray of the tibia reveal no abnormality, I personally repaired patient's laceration with 2 Prolene horizontal mattress repaired.  Patient tolerated procedure well.  He is instructed to return to the emergency department or follow-up with PCP in 7 to 10 days to have these removed.  He may alternate ibuprofen or Tylenol for his pain.  Return precautions discussed at length.  Patient stable for discharge.  Portions of this note were generated with Lobbyist. Dictation errors may occur despite best attempts at proofreading.    Final Clinical Impressions(s) / ED Diagnoses   Final diagnoses:  Laceration of left lower extremity, initial encounter    ED Discharge Orders    None       Janeece Fitting, PA-C 09/25/18 1516    Gareth Morgan, MD 09/26/18 [phone]

## 2018-09-25 NOTE — ED Triage Notes (Signed)
Pt reports lac from [place]  While working in his yard.

## 2018-09-25 NOTE — Discharge Instructions (Addendum)
I have placed 2 stiches to your left lower leg, please have these removed within 7-10 days. Keep your wound clean and dry, you may apply bacitracin to the area. If you experience any drainage, fever,please return to the ED.

## 2018-09-25 NOTE — ED Notes (Signed)
Patient verbalizes understanding of discharge instructions . Opportunity for questions and answers were provided . Armband removed by staff ,Pt discharged from ED. W/C  offered at D/C  and Declined W/C at D/C and was escorted to lobby by RN.  

## 2018-09-25 NOTE — ED Notes (Signed)
Patient back from  X-ray 

## 2018-09-25 NOTE — ED Notes (Signed)
Patient transported to X-ray 

## 2018-10-04 ENCOUNTER — Ambulatory Visit (HOSPITAL_COMMUNITY)
Admission: EM | Admit: 2018-10-04 | Discharge: 2018-10-04 | Disposition: A | Discharge status: Home / Self Care | Payer: Managed Care, Other (non HMO) | Attending: Physician Assistant | Admitting: Physician Assistant

## 2018-10-04 ENCOUNTER — Encounter (HOSPITAL_COMMUNITY): Payer: Self-pay

## 2018-10-04 ENCOUNTER — Other Ambulatory Visit: Payer: Self-pay

## 2018-10-04 DIAGNOSIS — H00015 Hordeolum externum left lower eyelid: Secondary | ICD-10-CM | POA: Diagnosis not present

## 2018-10-04 DIAGNOSIS — Z4802 Encounter for removal of sutures: Secondary | ICD-10-CM | POA: Diagnosis not present

## 2018-10-04 LAB — GLUCOSE, CAPILLARY: Glucose-Capillary: 80 mg/dL (ref 70–99)

## 2018-10-04 MED ORDER — TOBRAMYCIN 0.3 % OP SOLN: 2.0000 [drp] | OPHTHALMIC | 0 refills | Status: DC

## 2018-10-04 NOTE — Discharge Instructions (Addendum)
Warm compresses to left eye 20 minutes 4 times a day

## 2018-10-04 NOTE — ED Triage Notes (Signed)
Patient presents to Urgent Care with complaints of needing 2 stitches removed from his left leg. Patient reports he also has a stye under his left eye.  2 stitches removed, site appears swollen and red, PA aware.

## 2018-10-06 NOTE — ED Provider Notes (Signed)
[facility]    CSN: [id number] Arrival date & time: 10/04/18  1757      History   Chief Complaint Chief Complaint  Patient presents with  . Appointment    6:10  . Stye    HPI Alexander Osborne is a 38 y.o. male.   The history is provided by the patient. No language interpreter was used.  Suture / Staple Removal This is a new problem. The current episode started more than 1 week ago. The problem occurs constantly. Nothing aggravates the symptoms. Nothing relieves the symptoms. The treatment provided no relief.   Pt is here for suture removal Pt also complains of swelling over his left eye  Past Medical History:  Diagnosis Date  . Allergy   . Asthma   . Diabetes mellitus without complication [facility])     Patient Active Problem List   Diagnosis Date Noted  . Allergic conjunctivitis 09/14/2018  . Food allergy 06/29/2017  . Perennial and seasonal allergic rhinitis 06/29/2017  . Pruritus 06/29/2017  . Diabetes ([place]) 05/22/2017  . Mild intermittent asthma 02/07/2014    Past Surgical History:  Procedure Laterality Date  . corneal transplant left    . EYE SURGERY         Home Medications    Prior to Admission medications   Medication Sig Start Date End Date Taking? Authorizing Provider  beclomethasone (QVAR REDIHALER) 40 MCG/ACT inhaler Inhale 1 puff into the lungs 2 (two) times daily. 05/28/18   Horald Pollen, MD  glucose blood test strip Check sugar once daily  Dx: DMII controlled, non-insulin, without complications 2/37/62   Wardell Honour, MD  glucose monitoring kit (FREESTYLE) monitoring kit 1 each by Does not apply route as needed for other. 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     [provider]  PROAIR HFA 108 (90 Base) MCG/ACT inhaler INHALE 2 PUFFS INTO THE LUNGS EVERY 4 HOURS AS NEEDED FOR WHEEZING OR SHORTNESS OF BREATH OR COUGH Patient taking differently: Inhale 2 puffs into the lungs every 4 (four) hours as needed for wheezing or shortness of breath.  09/14/18   Bobbitt, Sed[place] Muta, MD  tobramycin (TOBREX) 0.3 % ophthalmic solution Place 2 drops into the left eye every 4 (four) hours. 10/04/18   Fransico Meadow, PA-C  VITAMIN D PO Take 1 capsule by mouth daily.    [provider]    Family History Family History  Problem Relation Age of Onset  . Cancer Mother 47       pancreatic cancer  . Hypertension Father   . Heart disease Father 77       CAD  . Bipolar disorder Brother   . Diabetes Sister   . Allergic rhinitis Neg Hx   . Angioedema Neg Hx   . Asthma Neg Hx   . Eczema Neg Hx   . Immunodeficiency Neg Hx   . Urticaria Neg Hx     Social History Social History   Tobacco Use  . Smoking status: Former Smoker    Packs/day: 0.25    Years: 11.00    Pack years: 2.75    Types: Cigarettes    Quit date: 10/03/2017    Years since quitting: 1.0  .  Smokeless tobacco: Never Used  Substance Use Topics  . Alcohol use: Yes    Alcohol/week: 1.0 standard drinks    Types: 1 Standard drinks or equivalent per week  . Drug use: No     Allergies   Shellfish allergy   Review of Systems Review of Systems  All other systems reviewed and are negative.    Physical Exam Triage Vital Signs ED Triage Vitals  Enc Vitals Group     BP 10/04/18 1835 (!) 137/92     Pulse Rate 10/04/18 1835 78     Resp 10/04/18 1835 18     Temp 10/04/18 1835 98.4 F (36.9 C)     Temp Source 10/04/18 1835 Oral     SpO2 10/04/18 1835 100 %     Weight --      Height --      Head Circumference --      Peak Flow --      Pain Score 10/04/18 1833 0     Pain Loc --      Pain Edu? --      Excl. in [place]? --    No data found.  Updated Vital Signs BP (!)  137/92 (BP Location: Left Arm)   Pulse 78   Temp 98.4 F (36.9 C) (Oral)   Resp 18   SpO2 100%   Visual Acuity Right Eye Distance:   Left Eye Distance:   Bilateral Distance:    Right Eye Near:   Left Eye Near:    Bilateral Near:     Physical Exam Vitals signs and nursing note reviewed.  Constitutional:      Appearance: He is well-developed.  HENT:     Head: Normocephalic and atraumatic.     Mouth/Throat:     Mouth: Mucous membranes are moist.  Eyes:     Conjunctiva/sclera: Conjunctivae normal.  Neck:     Musculoskeletal: Neck supple.  Cardiovascular:     Rate and Rhythm: Normal rate.     Heart sounds: No murmur.  Pulmonary:     Effort: Pulmonary effort is normal. No respiratory distress.  Abdominal:     Tenderness: There is no abdominal tenderness.  Skin:    General: Skin is warm and dry.  Neurological:     General: No focal deficit present.     Mental Status: He is alert.  Psychiatric:        Mood and Affect: Mood normal.      UC Treatments / Results  Labs (all labs ordered are listed, but only abnormal results are displayed) Labs Reviewed  GLUCOSE, CAPILLARY    EKG None  Radiology No results found.  Procedures Procedures (including critical care time)  Medications Ordered in UC Medications - No data to display  Initial Impression / Assessment and Plan / UC Course  I have reviewed the triage vital signs and the nursing notes.  Pertinent labs & imaging results that were available during my care of the patient were reviewed by me and considered in my medical decision making (see chart for details).     MDM  Sutures removed.  Pt counseled on suture removal and stye.  Final Clinical Impressions(s) / UC Diagnoses   Final diagnoses:  Visit for suture removal  Hordeolum externum of left lower eyelid     Discharge Instructions     Warm compresses to left eye 20 minutes 4 times a day   ED Prescriptions    Medication Sig Dispense Auth.  Provider  tobramycin (TOBREX) 0.3 % ophthalmic solution Place 2 drops into the left eye every 4 (four) hours. 5 mL Fransico Meadow, PA-C     Controlled Substance Prescriptions [place] Controlled Substance Registry consulted? Not Applicable   Fransico Meadow, [place] 10/06/18 1918

## 2018-12-19 ENCOUNTER — Encounter (HOSPITAL_COMMUNITY): Payer: Self-pay | Admitting: Emergency Medicine

## 2018-12-19 ENCOUNTER — Other Ambulatory Visit: Payer: Self-pay

## 2018-12-19 ENCOUNTER — Ambulatory Visit (HOSPITAL_COMMUNITY)
Admission: EM | Admit: 2018-12-19 | Discharge: 2018-12-19 | Disposition: A | Discharge status: Home / Self Care | Payer: Managed Care, Other (non HMO) | Attending: Family Medicine | Admitting: Family Medicine

## 2018-12-19 DIAGNOSIS — J029 Acute pharyngitis, unspecified: Secondary | ICD-10-CM

## 2018-12-19 DIAGNOSIS — J020 Streptococcal pharyngitis: Secondary | ICD-10-CM | POA: Diagnosis not present

## 2018-12-19 LAB — POCT RAPID STREP A: Streptococcus, Group A Screen (Direct): POSITIVE — AB

## 2018-12-19 MED ORDER — AMOXICILLIN 500 MG PO CAPS: 500.0000 mg | ORAL_CAPSULE | Freq: Two times a day (BID) | ORAL | 0 refills | Status: DC

## 2018-12-19 MED ORDER — NAPROXEN 500 MG PO TABS: 500.0000 mg | ORAL_TABLET | Freq: Two times a day (BID) | ORAL | 0 refills | Status: DC

## 2018-12-19 NOTE — ED Triage Notes (Signed)
Pt here with sore throat starting last night; pt with hx of strep and sts feels same

## 2018-12-19 NOTE — ED Provider Notes (Signed)
MRN: [id number] DOB: 01-12-1981  Subjective:   Alexander Osborne is a 38 y.o. male presenting for 1 day history of acute onset moderate worsening aching throat pain.  Patient states that he took ibuprofen, had salt water gargle.  Still feels his throat hurting.  He does not believe this is COVID-19.  States that he has had a history of trouble with strep infections, last episode was about 5 years ago.   No current facility-administered medications for this encounter.   Current Outpatient Medications:  .  beclomethasone (QVAR REDIHALER) 40 MCG/ACT inhaler, Inhale 1 puff into the lungs 2 (two) times daily., Disp: 1 Inhaler, Rfl: 11 .  glucose blood test strip, Check sugar once daily  Dx: DMII controlled, non-insulin, without complications, Disp: 401 each, Rfl: 12 .  glucose monitoring kit (FREESTYLE) monitoring kit, 1 each by Does not apply route as needed for other. 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  Lancets MISC, Check sugar once daily dx DMII controlled, non-insulin, without complications, Disp: 027 each, Rfl: 11 .  levocetirizine (XYZAL) 5 MG tablet, Take 5 mg by mouth as needed for allergies., Disp: , Rfl:  .  PROAIR HFA 108 (90 Base) MCG/ACT inhaler, INHALE 2 PUFFS INTO THE LUNGS EVERY 4 HOURS AS NEEDED FOR WHEEZING OR SHORTNESS OF BREATH OR COUGH (Patient taking differently: Inhale 2 puffs into the lungs every 4 (four) hours as needed for wheezing or shortness of breath. ), Disp: 8.5 g, Rfl: 1 .  tobramycin (TOBREX) 0.3 % ophthalmic solution, Place 2 drops into the left eye every 4 (four) hours. (Patient not taking: Reported on 12/19/2018), Disp: 5 mL, Rfl: 0 .  VITAMIN D PO, Take 1 capsule by mouth daily., Disp: , Rfl:    Allergies  Allergen Reactions  . Shellfish Allergy Other (See Comments)    Patient does not remember    Past Medical History:  Diagnosis Date  . Allergy   . Asthma   . Diabetes mellitus without complication  [facility])      Past Surgical History:  Procedure Laterality Date  . corneal transplant left    . EYE SURGERY      Review of Systems  Constitutional: Positive for malaise/fatigue. Negative for fever.  HENT: Positive for sore throat. Negative for congestion, ear pain and sinus pain.   Eyes: Negative for blurred vision, double vision, discharge and redness.  Respiratory: Negative for cough, hemoptysis, shortness of breath and wheezing.   Cardiovascular: Negative for chest pain.  Gastrointestinal: Negative for abdominal pain, diarrhea, nausea and vomiting.  Genitourinary: Negative for dysuria, flank pain and hematuria.  Musculoskeletal: Negative for myalgias.  Skin: Negative for rash.  Neurological: Negative for dizziness, weakness and headaches.  Psychiatric/Behavioral: Negative for depression and substance abuse.    Objective:   Vitals: BP (!) 151/87 (BP Location: Left Arm)   Pulse 97   Temp 99.6 F (37.6 C) (Oral)   Resp 18   SpO2 100%   Physical Exam Constitutional:      General: He is not in acute distress.    Appearance: Normal appearance. He is well-developed and normal weight. He is not ill-appearing.  HENT:     Head: Normocephalic and atraumatic.     Right Ear: Tympanic membrane, ear canal and external ear normal. There is no impacted cerumen.     Left Ear: Tympanic membrane, ear canal and external ear normal. There is no impacted cerumen.     Nose: Nose normal. No  congestion or rhinorrhea.     Mouth/Throat:     Mouth: Mucous membranes are moist.     Pharynx: Pharyngeal swelling (right side) and posterior oropharyngeal erythema (right side) present. No oropharyngeal exudate.  Eyes:     General: No scleral icterus.       Right eye: No discharge.        Left eye: No discharge.     Extraocular Movements: Extraocular movements intact.     Conjunctiva/sclera: Conjunctivae normal.     Pupils: Pupils are equal, round, and reactive to light.  Neck:     Musculoskeletal:  Normal range of motion and neck supple. No neck rigidity or muscular tenderness.  Cardiovascular:     Rate and Rhythm: Normal rate.  Pulmonary:     Effort: Pulmonary effort is normal.  Neurological:     General: No focal deficit present.     Mental Status: He is alert and oriented to person, place, and time.  Psychiatric:        Mood and Affect: Mood normal.        Behavior: Behavior normal.     Results for orders placed or performed during the hospital encounter of 12/19/18 (from the past 24 hour(s))  POCT rapid strep A [facility])     Status: Abnormal   Collection Time: 12/19/18  1:13 PM  Result Value Ref Range   Streptococcus, Group A Screen (Direct) POSITIVE (A) NEGATIVE    Assessment and Plan :   1. Pharyngitis due to Streptococcus species   2. Sore throat     Start amoxicillin to cover for strep pharyngitis, use naproxen for pain and inflammation. Counseled patient on potential for adverse effects with medications prescribed/recommended today, ER and return-to-clinic precautions discussed, patient verbalized understanding.    Jaynee Eagles, [place] 12/19/18 1325

## 2019-01-06 LAB — HM DIABETES EYE EXAM

## 2019-03-31 ENCOUNTER — Other Ambulatory Visit: Payer: Self-pay

## 2019-03-31 MED ORDER — ALBUTEROL SULFATE HFA 108 (90 BASE) MCG/ACT IN AERS: 2.0000 | INHALATION_SPRAY | Freq: Four times a day (QID) | RESPIRATORY_TRACT | 1 refills | Status: DC | PRN

## 2019-08-29 ENCOUNTER — Ambulatory Visit (HOSPITAL_COMMUNITY)
Admission: EM | Admit: 2019-08-29 | Discharge: 2019-08-29 | Disposition: A | Discharge status: Home / Self Care | Payer: Managed Care, Other (non HMO) | Attending: Internal Medicine | Admitting: Internal Medicine

## 2019-08-29 ENCOUNTER — Other Ambulatory Visit: Payer: Self-pay

## 2019-08-29 DIAGNOSIS — A084 Viral intestinal infection, unspecified: Secondary | ICD-10-CM

## 2019-08-29 DIAGNOSIS — M940 Chondrocostal junction syndrome [Tietze]: Secondary | ICD-10-CM

## 2019-08-29 MED ORDER — ONDANSETRON 4 MG PO TBDP: 4.0000 mg | ORAL_TABLET | Freq: Three times a day (TID) | ORAL | 0 refills | Status: DC | PRN

## 2019-08-29 MED ORDER — ACETAMINOPHEN 500 MG PO TABS: 500.0000 mg | ORAL_TABLET | Freq: Four times a day (QID) | ORAL | 0 refills | Status: DC | PRN

## 2019-08-29 NOTE — ED Triage Notes (Signed)
Pt woke up with nausea this AM. Pepto taken at home without improvement. Pt now c/o L sided abdominal pain and diarrhea

## 2019-08-29 NOTE — ED Provider Notes (Signed)
[place]    CSN: [id number] Arrival date & time: 08/29/19  1411      History   Chief Complaint Chief Complaint  Patient presents with  . Emesis    HPI Alexander Osborne is a 39 y.o. male comes to urgent care with the 1 day history of nausea, diarrhea and left upper quadrant abdominal pain.  Nausea started this morning and has been persistent.  He denies any vomiting.  He has left upper quadrant abdominal pain and has had a bout of diarrhea.  No fever or chills.  No sore throat, runny nose, loss of taste or smell.  No change in the patient's diet no no unusual diet.  No sick contacts.  Patient took Pepto-Bismol at home with no relief.  He comes to the urgent care for further evaluation.   HPI  Past Medical History:  Diagnosis Date  . Allergy   . Asthma   . Diabetes mellitus without complication [facility])     Patient Active Problem List   Diagnosis Date Noted  . Allergic conjunctivitis 09/14/2018  . Food allergy 06/29/2017  . Perennial and seasonal allergic rhinitis 06/29/2017  . Pruritus 06/29/2017  . Diabetes ([place]) 05/22/2017  . Mild intermittent asthma 02/07/2014    Past Surgical History:  Procedure Laterality Date  . corneal transplant left    . EYE SURGERY         Home Medications    Prior to Admission medications   Medication Sig Start Date End Date Taking? Authorizing Provider  acetaminophen (TYLENOL) 500 MG tablet Take 1 tablet (500 mg total) by mouth every 6 (six) hours as needed. 08/29/19   Herny Scurlock, Myrene Galas, MD  albuterol (VENTOLIN HFA) 108 (90 Base) MCG/ACT inhaler Inhale 2 puffs into the lungs every 6 (six) hours as needed for wheezing or shortness of breath. 03/31/19   Bobbitt, [place] Muta, MD  beclomethasone (QVAR REDIHALER) 40 MCG/ACT inhaler Inhale 1 puff into the lungs 2 (two) times daily. 05/28/18   Horald Pollen, MD  glucose blood test strip Check sugar once daily  Dx: DMII controlled, non-insulin, without complications 8/59/29    Wardell Honour, MD  glucose monitoring kit (FREESTYLE) monitoring kit 1 each by Does not apply route as needed for other. Dx: DMII non-insulin dependent without complications; controlled; 05/04/17   Wardell Honour, MD  Lancets MISC Check sugar once daily dx DMII controlled, non-insulin, without complications 2/44/62   Wardell Honour, MD  levocetirizine (XYZAL) 5 MG tablet Take 5 mg by mouth as needed for allergies.    [provider]  naproxen (NAPROSYN) 500 MG tablet Take 1 tablet (500 mg total) by mouth 2 (two) times daily. 12/19/18   Jaynee Eagles, PA-C  ondansetron (ZOFRAN ODT) 4 MG disintegrating tablet Take 1 tablet (4 mg total) by mouth every 8 (eight) hours as needed for nausea or vomiting. 08/29/19   Emir Nack, Myrene Galas, MD  VITAMIN D PO Take 1 capsule by mouth daily.    [provider]    Family History Family History  Problem Relation Age of Onset  . Cancer Mother 3       pancreatic cancer  . Hypertension Father   . Heart disease Father 73       CAD  . Bipolar disorder Brother   . Diabetes Sister   . Allergic rhinitis Neg Hx   . Angioedema Neg Hx   . Asthma Neg Hx   . Eczema Neg Hx   .  Immunodeficiency Neg Hx   . Urticaria Neg Hx     Social History Social History   Tobacco Use  . Smoking status: Former Smoker    Packs/day: 0.25    Years: 11.00    Pack years: 2.75    Types: Cigarettes    Quit date: 10/03/2017    Years since quitting: 1.9  . Smokeless tobacco: Never Used  Substance Use Topics  . Alcohol use: Yes    Alcohol/week: 1.0 standard drinks    Types: 1 Standard drinks or equivalent per week  . Drug use: No     Allergies   Shellfish allergy   Review of Systems Review of Systems  Constitutional: Negative.   Respiratory: Negative.   Gastrointestinal: Positive for abdominal pain, diarrhea and nausea. Negative for vomiting.  Genitourinary: Negative.   Musculoskeletal: Negative for arthralgias, gait problem, neck pain and neck  stiffness.  Skin: Negative.   Neurological: Negative for dizziness, light-headedness and headaches.     Physical Exam Triage Vital Signs ED Triage Vitals [08/29/19 1455]  Enc Vitals Group     BP (!) 143/88     Pulse Rate 88     Resp 16     Temp 98 F (36.7 C)     Temp src      SpO2 95 %     Weight      Height      Head Circumference      Peak Flow      Pain Score 3     Pain Loc      Pain Edu?      Excl. in [place]?    No data found.  Updated Vital Signs BP (!) 143/88   Pulse 88   Temp 98 F (36.7 C)   Resp 16   SpO2 95%   Visual Acuity Right Eye Distance:   Left Eye Distance:   Bilateral Distance:    Right Eye Near:   Left Eye Near:    Bilateral Near:     Physical Exam Vitals and nursing note reviewed.  Constitutional:      General: He is not in acute distress.    Appearance: He is not ill-appearing.  Cardiovascular:     Rate and Rhythm: Normal rate and regular rhythm.     Pulses: Normal pulses.     Heart sounds: Normal heart sounds.  Pulmonary:     Effort: Pulmonary effort is normal.     Breath sounds: Normal breath sounds. No wheezing or rhonchi.  Abdominal:     General: Bowel sounds are normal.     Palpations: Abdomen is soft.     Tenderness: There is abdominal tenderness.     Comments: Tenderness over the costal cartilages in the left upper quadrant abdominal area.  No swelling.  Musculoskeletal:        General: No swelling or signs of injury. Normal range of motion.     Cervical back: Normal range of motion.  Skin:    General: Skin is warm.     Capillary Refill: Capillary refill takes less than 2 seconds.  Neurological:     General: No focal deficit present.     Mental Status: He is alert and oriented to person, place, and time.      UC Treatments / Results  Labs (all labs ordered are listed, but only abnormal results are displayed) Labs Reviewed - No data to display  EKG   Radiology No results found.  Procedures Procedures  (including  critical care time)  Medications Ordered in UC Medications - No data to display  Initial Impression / Assessment and Plan / UC Course  I have reviewed the triage vital signs and the nursing notes.  Pertinent labs & imaging results that were available during my care of the patient were reviewed by me and considered in my medical decision making (see chart for details).     1.  Viral gastroenteritis: Patient is encouraged to push oral fluids Tylenol as needed for pain Zofran as needed for nausea/vomiting Return precautions given  2.  Costochondritis: Tylenol as needed for pain Warm compresses This is usually self-limiting Return precautions given. Final Clinical Impressions(s) / UC Diagnoses   Final diagnoses:  Costochondritis  Viral gastroenteritis   Discharge Instructions   None    ED Prescriptions    Medication Sig Dispense Auth. Provider   acetaminophen (TYLENOL) 500 MG tablet Take 1 tablet (500 mg total) by mouth every 6 (six) hours as needed. 30 tablet Della Scrivener, Myrene Galas, MD   ondansetron (ZOFRAN ODT) 4 MG disintegrating tablet Take 1 tablet (4 mg total) by mouth every 8 (eight) hours as needed for nausea or vomiting. 20 tablet Hanif Radin, Myrene Galas, MD     PDMP not reviewed this encounter.   Chase Picket, MD 08/29/19 [phone]

## 2019-09-26 ENCOUNTER — Ambulatory Visit: Payer: Managed Care, Other (non HMO) | Admitting: Emergency Medicine

## 2019-09-26 ENCOUNTER — Ambulatory Visit (INDEPENDENT_AMBULATORY_CARE_PROVIDER_SITE_OTHER): Payer: Managed Care, Other (non HMO) | Admitting: Allergy and Immunology

## 2019-09-26 ENCOUNTER — Other Ambulatory Visit: Payer: Self-pay

## 2019-09-26 ENCOUNTER — Encounter: Payer: Self-pay | Admitting: Allergy and Immunology

## 2019-09-26 VITALS — BP 128/82 | HR 89 | Temp 98.2°F | Resp 18 | Ht 69.0 in

## 2019-09-26 DIAGNOSIS — L505 Cholinergic urticaria: Secondary | ICD-10-CM

## 2019-09-26 DIAGNOSIS — J4520 Mild intermittent asthma, uncomplicated: Secondary | ICD-10-CM

## 2019-09-26 DIAGNOSIS — H1013 Acute atopic conjunctivitis, bilateral: Secondary | ICD-10-CM

## 2019-09-26 DIAGNOSIS — J3089 Other allergic rhinitis: Secondary | ICD-10-CM

## 2019-09-26 DIAGNOSIS — T7800XD Anaphylactic reaction due to unspecified food, subsequent encounter: Secondary | ICD-10-CM

## 2019-09-26 DIAGNOSIS — T7800XA Anaphylactic reaction due to unspecified food, initial encounter: Secondary | ICD-10-CM

## 2019-09-26 MED ORDER — ALBUTEROL SULFATE HFA 108 (90 BASE) MCG/ACT IN AERS: 2.0000 | INHALATION_SPRAY | Freq: Four times a day (QID) | RESPIRATORY_TRACT | 1 refills | Status: DC | PRN

## 2019-09-26 MED ORDER — LEVOCETIRIZINE DIHYDROCHLORIDE 5 MG PO TABS: 5.0000 mg | ORAL_TABLET | ORAL | 3 refills | Status: DC | PRN

## 2019-09-26 MED ORDER — EPINEPHRINE 0.3 MG/0.3ML IJ SOAJ: 0.3000 mg | Freq: Once | INTRAMUSCULAR | 1 refills | Status: AC

## 2019-09-26 NOTE — Assessment & Plan Note (Signed)
   Treatment plan as outlined above for allergic rhinitis.  A prescription has been provided for generic Pataday, one drop per eye daily as needed.  If insurance does not cover this medication, medicated allergy eyedrops may be purchased over-the-counter as Pataday Extra Strength or Zaditor.  I have also recommended eye lubricant drops (i.e., Natural Tears) as needed. 

## 2019-09-26 NOTE — Patient Instructions (Addendum)
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 Antihistamines, including levocetirizine, are helpful for cholinergic urticaria. The response to second generation antihistamines is important because some of the antihistaminic effect has been attributed to antimuscarinic activity. Montelukast and propranolol have also been shown to be helpful in the management of cholinergic urticaria if antihistamines alone are insufficient.  A prescription has been provided for levocetirizine, 5 mg daily as needed and prior to vigorous exertion which would lead to sweating.  To avoid diminishing benefit with daily use (tachyphylaxis) of second generation antihistamine, consider alternating every few months between fexofenadine (Allegra) and levocetirizine (Xyzal).  I have encouraged the use of cool wet washcloths or towels after exercise/sweating.  Mild intermittent asthma  Continue albuterol HFA, 1-2 inhalations every 6 hours if needed and 10-15 minutes prior to vigorous exercise.  A refill prescription has been provided for albuterol HFA.  Subjective and objective measures of pulmonary function will be followed and the treatment plan will be adjusted accordingly.  Perennial and seasonal allergic rhinitis  Continue with allergen avoidance measures and levocetirizine 5 mg daily if needed.  Add Flonase or Nasacort AQ if needed.  Nasal saline spray (i.e., Simply Saline) or nasal saline lavage (i.e., NeilMed) is recommended as needed and prior to medicated nasal sprays.  If allergen avoidance measures and medications fail to adequately relieve symptoms, aeroallergen immunotherapy will be considered.  Allergic conjunctivitis  Treatment plan as outlined above for allergic rhinitis.  A prescription has been provided for generic Pataday, one drop per eye daily as needed.  If insurance does not cover this medication, medicated allergy eyedrops may be purchased over-the-counter as  Art therapist.  I have also recommended eye lubricant drops (i.e., Natural Tears) as needed.  Food allergy  Continue meticulous avoidance of shellfish and have access to epinephrine autoinjector 2 pack in case of accidental ingestion.  A refill prescription has been provided for epinephrine 0.3 mg autoinjector (AuviQ) 2 pack along with instructions for its proper administration.   Return in about 6 months (around 03/27/2020), or if symptoms worsen or fail to improve.

## 2019-09-26 NOTE — Assessment & Plan Note (Signed)
   Continue albuterol HFA, 1-2 inhalations every 6 hours if needed and 10-15 minutes prior to vigorous exercise.  A refill prescription has been provided for albuterol HFA.  Subjective and objective measures of pulmonary function will be followed and the treatment plan will be adjusted accordingly.

## 2019-09-26 NOTE — Assessment & Plan Note (Signed)
   Continue with allergen avoidance measures and levocetirizine 5 mg daily if needed.  Add Flonase or Nasacort AQ if needed.  Nasal saline spray (i.e., Simply Saline) or nasal saline lavage (i.e., NeilMed) is recommended as needed and prior to medicated nasal sprays.  If allergen avoidance measures and medications fail to adequately relieve symptoms, aeroallergen immunotherapy will be considered.

## 2019-09-26 NOTE — Assessment & Plan Note (Signed)
The patient's history suggests cholinergic urticaria. Antihistamines, including levocetirizine, are helpful for cholinergic urticaria. The response to second generation antihistamines is important because some of the antihistaminic effect has been attributed to antimuscarinic activity. Montelukast and propranolol have also been shown to be helpful in the management of cholinergic urticaria if antihistamines alone are insufficient.  A prescription has been provided for levocetirizine, 5 mg daily as needed and prior to vigorous exertion which would lead to sweating.  To avoid diminishing benefit with daily use (tachyphylaxis) of second generation antihistamine, consider alternating every few months between fexofenadine (Allegra) and levocetirizine (Xyzal).  I have encouraged the use of cool wet washcloths or towels after exercise/sweating.

## 2019-09-26 NOTE — Progress Notes (Signed)
Follow-up Note  RE: Alexander Osborne MRN: [id number] DOB: 1980/06/03 Date of Office Visit: 09/26/2019  Primary care provider: Horald Pollen, MD Referring provider: Horald Pollen, *  History of present illness: Alexander Osborne is a 39 y.o. male with asthma, allergic rhinitis, and food allergy presenting today for follow-up and a new problem.  He was last seen in this clinic in May 2020.  He reports that the interval since his previous visit his asthma has been well controlled.  He uses albuterol prior to exercise and, while doing so has not required albuterol rescue.  He does note that if he forgets to use the albuterol prior to exercise he does require albuterol rescue.  He does not experience limitations in normal daily activities or nocturnal awakenings due to lower respiratory symptoms. He needs a refill prescription for albuterol HFA.   His nasal allergy symptoms have been well controlled while taking his allergy medications, however he admits that he frequently forgets to take the allergy medications.  He has been carefully avoiding shellfish and has not had accidental ingestion or epinephrine requirement.  He needs a refill for his epinephrine autoinjectors.   He does complain of small hives to develop on his forearms if he becomes hot and/or sweats.  He typically develops the hives right before sexual intercourse or while working out at [employer].  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 Antihistamines, including levocetirizine, are helpful for cholinergic urticaria. The response to second generation antihistamines is important because some of the antihistaminic effect has been attributed to antimuscarinic activity. Montelukast and propranolol have also been shown to be helpful in the management of cholinergic urticaria if antihistamines alone are insufficient.  A prescription has been provided for  levocetirizine, 5 mg daily as needed and prior to vigorous exertion which would lead to sweating.  To avoid diminishing benefit with daily use (tachyphylaxis) of second generation antihistamine, consider alternating every few months between fexofenadine (Allegra) and levocetirizine (Xyzal).  I have encouraged the use of cool wet washcloths or towels after exercise/sweating.  Mild intermittent asthma  Continue albuterol HFA, 1-2 inhalations every 6 hours if needed and 10-15 minutes prior to vigorous exercise.  A refill prescription has been provided for albuterol HFA.  Subjective and objective measures of pulmonary function will be followed and the treatment plan will be adjusted accordingly.  Perennial and seasonal allergic rhinitis  Continue with allergen avoidance measures and levocetirizine 5 mg daily if needed.  Add Flonase or Nasacort AQ if needed.  Nasal saline spray (i.e., Simply Saline) or nasal saline lavage (i.e., NeilMed) is recommended as needed and prior to medicated nasal sprays.  If allergen avoidance measures and medications fail to adequately relieve symptoms, aeroallergen immunotherapy will be considered.  Allergic conjunctivitis  Treatment plan as outlined above for allergic rhinitis.  A prescription has been provided for generic Pataday, one drop per eye daily as needed.  If insurance does not cover this medication, medicated allergy eyedrops may be purchased over-the-counter as Restaurant manager, fast food.  I have also recommended eye lubricant drops (i.e., Natural Tears) as needed.  Food allergy  Continue meticulous avoidance of shellfish and have access to epinephrine autoinjector 2 pack in case of accidental ingestion.  A refill prescription has been provided for epinephrine 0.3 mg autoinjector (AuviQ) 2 pack along with instructions for its proper administration.   Meds ordered this encounter  Medications  . EPINEPHrine 0.3 mg/0.3 mL IJ SOAJ  injection    Sig: Inject 0.3 mLs (0.3 mg total) into the muscle once for 1 dose. As needed for life-threatening allergic reactions    Dispense:  2 each    Refill:  1    Please dispense Mylan brand or Teva  . albuterol (VENTOLIN HFA) 108 (90 Base) MCG/ACT inhaler    Sig: Inhale 2 puffs into the lungs every 6 (six) hours as needed for wheezing or shortness of breath.    Dispense:  18 g    Refill:  1  . levocetirizine (XYZAL) 5 MG tablet    Sig: Take 1 tablet (5 mg total) by mouth as needed for allergies.    Dispense:  30 tablet    Refill:  3    Diagnostics: Spirometry reveals an FEV1 of 78% predicted with an FEV1 ratio of 95%.  The FEV1 is consistent with previous study.  Please see scanned spirometry results for details.    Physical examination: Blood pressure 128/82, pulse 89, temperature 98.2 F (36.8 C), temperature source Temporal, resp. rate 18, height _0  (1.753 m), SpO2 97 %.  General: Alert, interactive, in no acute distress. HEENT: TMs pearly gray, turbinates moderately edematous with clear discharge, post-pharynx moderately erythematous. Neck: Supple without lymphadenopathy. Lungs: Clear to auscultation without wheezing, rhonchi or rales. CV: Normal S1, S2 without murmurs. Skin: Warm and dry, without lesions or rashes.  The following portions of the patient's history were reviewed and updated as appropriate: allergies, current medications, past family history, past medical history, past social history, past surgical history and problem list.  Current Outpatient Medications  Medication Sig Dispense Refill  . acetaminophen (TYLENOL) 500 MG tablet Take 1 tablet (500 mg total) by mouth every 6 (six) hours as needed. 30 tablet 0  . albuterol (VENTOLIN HFA) 108 (90 Base) MCG/ACT inhaler Inhale 2 puffs into the lungs every 6 (six) hours as needed for wheezing or shortness of breath. 18 g 1  . beclomethasone (QVAR REDIHALER) 40 MCG/ACT inhaler Inhale 1 puff into the lungs 2  (two) times daily. 1 Inhaler 11  . glucose blood test strip Check sugar once daily  Dx: DMII controlled, non-insulin, without complications 960 each 12  . glucose monitoring kit (FREESTYLE) monitoring kit 1 each by Does not apply route as needed for other. 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 Lancets MISC Check sugar once daily dx DMII controlled, non-insulin, without complications 454 each 11  . levocetirizine (XYZAL) 5 MG tablet Take 1 tablet (5 mg total) by mouth as needed for allergies. 30 tablet 3  . naproxen (NAPROSYN) 500 MG tablet Take 1 tablet (500 mg total) by mouth 2 (two) times daily. 30 tablet 0  . ondansetron (ZOFRAN ODT) 4 MG disintegrating tablet Take 1 tablet (4 mg total) by mouth every 8 (eight) hours as needed for nausea or vomiting. 20 tablet 0  . VITAMIN D PO Take 1 capsule by mouth daily.    Marland Kitchen EPINEPHrine 0.3 mg/0.3 mL IJ SOAJ injection Inject 0.3 mLs (0.3 mg total) into the muscle once for 1 dose. As needed for life-threatening allergic reactions 2 each 1   No current facility-administered medications for this visit.    Allergies  Allergen Reactions  . Shellfish Allergy Other (See Comments)    Patient does not remember   Review of systems: Review of systems negative except as noted in HPI / PMHx.  Past Medical History:  Diagnosis Date  . Allergy   . Asthma   .  Diabetes mellitus without complication ([place])     Family History  Problem Relation Age of Onset  . Cancer Mother 40       pancreatic cancer  . Hypertension Father   . Heart disease Father 64       CAD  . Bipolar disorder Brother   . Diabetes Sister   . Allergic rhinitis Neg Hx   . Angioedema Neg Hx   . Asthma Neg Hx   . Eczema Neg Hx   . Immunodeficiency Neg Hx   . Urticaria Neg Hx     Social History   Socioeconomic History  . Marital status: Married    Spouse name: Not on file  . Number of children: 0  . Years of education: Not on file  . Highest  education level: Not on file  Occupational History  . 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 Smoking status: Former Smoker    Packs/day: 0.25    Years: 11.00    Pack years: 2.75    Types: Cigarettes    Quit date: 10/03/2017    Years since quitting: 1.9  . Smokeless tobacco: Never Used  Substance and Sexual Activity  . Alcohol use: Yes    Alcohol/week: 1.0 standard drinks    Types: 1 Standard drinks or equivalent per week  . Drug use: No  . Sexual activity: Yes    Partners: Male    Birth control/protection: None    Comment: SS partner  Other Topics Concern  . Not on file  Social History Narrative   Marital status: married x 4 years; happily married.       Lives; with husbnad      Employment: Librarian, academic at [employer]: socially; weekends      Alcohol: 1-3 drinks per week      Drugs: none      Exercise: none   Denies caffeine use    Social Determinants of Radio broadcast assistant Strain:   . Difficulty of Paying Living Expenses:   Food Insecurity:   . Worried About Charity fundraiser in the Last Year:   . Arboriculturist in the Last Year:   Transportation Needs:   . Film/video editor (Medical):   Marland Kitchen Lack of Transportation (Non-Medical):   Physical Activity:   . Days of Exercise per Week:   . Minutes of Exercise per Session:   Stress:   . Feeling of Stress :   Social Connections:   . Frequency of Communication with Friends and Family:   . Frequency of Social Gatherings with Friends and Family:   . Attends Religious Services:   . Active Member of Clubs or Organizations:   . Attends Archivist Meetings:   Marland Kitchen Marital Status:   Intimate Partner Violence:   . Fear of Current or Ex-Partner:   . Emotionally Abused:   Marland Kitchen Physically Abused:   . Sexually Abused:     I appreciate the opportunity to take part in Antone's care. Please do not hesitate to contact me with questions.  Sincerely,   R. Edgar Frisk, MD

## 2019-09-26 NOTE — Assessment & Plan Note (Signed)
   Continue meticulous avoidance of shellfish and have access to epinephrine autoinjector 2 pack in case of accidental ingestion.  A refill prescription has been provided for epinephrine 0.3 mg autoinjector (AuviQ) 2 pack along with instructions for its proper administration.

## 2019-10-03 ENCOUNTER — Other Ambulatory Visit: Payer: Self-pay

## 2019-10-03 MED ORDER — EPINEPHRINE 0.3 MG/0.3ML IJ SOAJ: 0.3000 mg | Freq: Once | INTRAMUSCULAR | 1 refills | Status: AC

## 2019-10-25 ENCOUNTER — Encounter: Payer: Managed Care, Other (non HMO) | Admitting: Emergency Medicine

## 2019-11-09 ENCOUNTER — Encounter: Payer: Managed Care, Other (non HMO) | Admitting: Emergency Medicine

## 2019-11-28 ENCOUNTER — Other Ambulatory Visit: Payer: Self-pay

## 2019-11-28 ENCOUNTER — Ambulatory Visit (INDEPENDENT_AMBULATORY_CARE_PROVIDER_SITE_OTHER): Payer: Managed Care, Other (non HMO) | Admitting: Emergency Medicine

## 2019-11-28 ENCOUNTER — Encounter: Payer: Self-pay | Admitting: Emergency Medicine

## 2019-11-28 VITALS — BP 124/74 | HR 82 | Temp 97.9°F | Resp 16 | Ht 69.5 in | Wt 222.0 lb

## 2019-11-28 DIAGNOSIS — Z13220 Encounter for screening for lipoid disorders: Secondary | ICD-10-CM | POA: Diagnosis not present

## 2019-11-28 DIAGNOSIS — Z0000 Encounter for general adult medical examination without abnormal findings: Secondary | ICD-10-CM | POA: Diagnosis not present

## 2019-11-28 DIAGNOSIS — Z1329 Encounter for screening for other suspected endocrine disorder: Secondary | ICD-10-CM | POA: Diagnosis not present

## 2019-11-28 DIAGNOSIS — Z130 Encounter for screening for diseases of the blood and blood-forming organs and certain disorders involving the immune mechanism: Secondary | ICD-10-CM

## 2019-11-28 DIAGNOSIS — Z13228 Encounter for screening for other metabolic disorders: Secondary | ICD-10-CM

## 2019-11-28 NOTE — Patient Instructions (Addendum)
   If you have lab work done today you will be contacted with your lab results within the next 2 weeks.  If you have not heard from us then please contact us. The fastest way to get your results is to register for My Chart.   IF you received an x-ray today, you will receive an invoice from [facility]. Please contact [facility] at [phone] with questions or concerns regarding your invoice.   IF you received labwork today, you will receive an invoice from [facility]. Please contact [facility] at [phone] with questions or concerns regarding your invoice.   Our billing staff will not be able to assist you with questions regarding bills from these companies.  You will be contacted with the lab results as soon as they are available. The fastest way to get your results is to activate your My Chart account. Instructions are located on the last page of this paperwork. If you have not heard from us regarding the results in 2 weeks, please contact this office.      Health Maintenance, Male Adopting a healthy lifestyle and getting preventive care are important in promoting health and wellness. Ask your health care provider about:  The right schedule for you to have regular tests and exams.  Things you can do on your own to prevent diseases and keep yourself healthy. What should I know about diet, weight, and exercise? Eat a healthy diet   Eat a diet that includes plenty of vegetables, fruits, low-fat dairy products, and lean protein.  Do not eat a lot of foods that are high in solid fats, added sugars, or sodium. Maintain a healthy weight Body mass index (BMI) is a measurement that can be used to identify possible weight problems. It estimates body fat based on height and weight. Your health care provider can help determine your BMI and help you achieve or maintain a healthy weight. Get regular exercise Get regular exercise. This is one of the most important things you  can do for your health. Most adults should:  Exercise for at least 150 minutes each week. The exercise should increase your heart rate and make you sweat (moderate-intensity exercise).  Do strengthening exercises at least twice a week. This is in addition to the moderate-intensity exercise.  Spend less time sitting. Even light physical activity can be beneficial. Watch cholesterol and blood lipids Have your blood tested for lipids and cholesterol at 37 years of age, then have this test every 5 years. You may need to have your cholesterol levels checked more often if:  Your lipid or cholesterol levels are high.  You are older than 37 years of age.  You are at high risk for heart disease. What should I know about cancer screening? Many types of cancers can be detected early and may often be prevented. Depending on your health history and family history, you may need to have cancer screening at various ages. This may include screening for:  Colorectal cancer.  Prostate cancer.  Skin cancer.  Lung cancer. What should I know about heart disease, diabetes, and high blood pressure? Blood pressure and heart disease  High blood pressure causes heart disease and increases the risk of stroke. This is more likely to develop in people who have high blood pressure readings, are of African descent, or are overweight.  Talk with your health care provider about your target blood pressure readings.  Have your blood pressure checked: ? Every 3-5 years if you are 18-39   years of age. ? Every year if you are 40 years old or older.  If you are between the ages of 65 and 75 and are a current or former smoker, ask your health care provider if you should have a one-time screening for abdominal aortic aneurysm (AAA). Diabetes Have regular diabetes screenings. This checks your fasting blood sugar level. Have the screening done:  Once every three years after age 45 if you are at a normal weight and have  a low risk for diabetes.  More often and at a younger age if you are overweight or have a high risk for diabetes. What should I know about preventing infection? Hepatitis B If you have a higher risk for hepatitis B, you should be screened for this virus. Talk with your health care provider to find out if you are at risk for hepatitis B infection. Hepatitis C Blood testing is recommended for:  Everyone born from 1945 through 1965.  Anyone with known risk factors for hepatitis C. Sexually transmitted infections (STIs)  You should be screened each year for STIs, including gonorrhea and chlamydia, if: ? You are sexually active and are younger than 37 years of age. ? You are older than 37 years of age and your health care provider tells you that you are at risk for this type of infection. ? Your sexual activity has changed since you were last screened, and you are at increased risk for chlamydia or gonorrhea. Ask your health care provider if you are at risk.  Ask your health care provider about whether you are at high risk for HIV. Your health care provider may recommend a prescription medicine to help prevent HIV infection. If you choose to take medicine to prevent HIV, you should first get tested for HIV. You should then be tested every 3 months for as long as you are taking the medicine. Follow these instructions at home: Lifestyle  Do not use any products that contain nicotine or tobacco, such as cigarettes, e-cigarettes, and chewing tobacco. If you need help quitting, ask your health care provider.  Do not use street drugs.  Do not share needles.  Ask your health care provider for help if you need support or information about quitting drugs. Alcohol use  Do not drink alcohol if your health care provider tells you not to drink.  If you drink alcohol: ? Limit how much you have to 0-2 drinks a day. ? Be aware of how much alcohol is in your drink. In the [place] one drink equals one 12  oz bottle of beer (355 mL), one 5 oz glass of wine (148 mL), or one 1 oz glass of hard liquor (44 mL). General instructions  Schedule regular health, dental, and eye exams.  Stay current with your vaccines.  Tell your health care provider if: ? You often feel depressed. ? You have ever been abused or do not feel safe at home. Summary  Adopting a healthy lifestyle and getting preventive care are important in promoting health and wellness.  Follow your health care provider's instructions about healthy diet, exercising, and getting tested or screened for diseases.  Follow your health care provider's instructions on monitoring your cholesterol and blood pressure. This information is not intended to replace advice given to you by your health care provider. Make sure you discuss any questions you have with your health care provider. Document Revised: 03/31/2018 Document Reviewed: 03/31/2018 Elsevier Patient Education  2020 [employer]  

## 2019-11-28 NOTE — Progress Notes (Signed)
S[place] 39 y.o.   Chief Complaint  Patient presents with  . Annual Exam    not fasting had lunch about 1300    HISTORY OF PRESENT ILLNESS: This is a 39 y.o. male here for his annual exam. Healthy male with a healthy lifestyle. Past medical history includes diabetes and asthma. #1 diabetes: Controlled with diet and exercise.  Was first diagnosed in 2019.  Was never on medications. #2 asthma: Well-controlled.  Not using inhalers at present time.  Has not had to use albuterol rescue inhaler in a while.  Not taking Qvar. Has no complaints or medical concerns today.  HPI   Prior to Admission medications   Medication Sig Start Date End Date Taking? Authorizing Provider  acetaminophen (TYLENOL) 500 MG tablet Take 1 tablet (500 mg total) by mouth every 6 (six) hours as needed. 08/29/19  Yes Lamptey, Myrene Galas, MD  albuterol (VENTOLIN HFA) 108 (90 Base) MCG/ACT inhaler Inhale 2 puffs into the lungs every 6 (six) hours as needed for wheezing or shortness of breath. 09/26/19  Yes Bobbitt, [place] Muta, MD  levocetirizine (XYZAL) 5 MG tablet Take 1 tablet (5 mg total) by mouth as needed for allergies. 09/26/19  Yes Bobbitt, [place] Muta, MD  naproxen (NAPROSYN) 500 MG tablet Take 1 tablet (500 mg total) by mouth 2 (two) times daily. 12/19/18  Yes Jaynee Eagles, PA-C  beclomethasone (QVAR REDIHALER) 40 MCG/ACT inhaler Inhale 1 puff into the lungs 2 (two) times daily. Patient not taking: Reported on 11/28/2019 05/28/18   Horald Pollen, MD  glucose blood test strip Check sugar once daily  Dx: DMII controlled, non-insulin, without complications 12/18/54   Wardell Honour, MD  glucose monitoring kit (FREESTYLE) monitoring kit 1 each by Does not apply route as needed for other. Dx: DMII non-insulin dependent without complications; controlled; 05/04/17   Wardell Honour, MD  Lancets MISC Check sugar once daily dx DMII controlled, non-insulin, without complications 06/03/06   Wardell Honour, MD    ondansetron (ZOFRAN ODT) 4 MG disintegrating tablet Take 1 tablet (4 mg total) by mouth every 8 (eight) hours as needed for nausea or vomiting. Patient not taking: Reported on 11/28/2019 08/29/19   Chase Picket, MD  VITAMIN D PO Take 1 capsule by mouth daily. Patient not taking: Reported on 11/28/2019    [provider]    Allergies  Allergen Reactions  . Shellfish Allergy Other (See Comments)    Patient does not remember    Patient Active Problem List   Diagnosis Date Noted  . 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 Allergic conjunctivitis 09/14/2018  . Food allergy 06/29/2017  . Perennial and seasonal allergic rhinitis 06/29/2017  . Pruritus 06/29/2017  . Diabetes ([place]) 05/22/2017  . Mild intermittent asthma 02/07/2014    Past Medical History:  Diagnosis Date  . Allergy   . Asthma   . Diabetes mellitus without complication [facility])     Past Surgical History:  Procedure Laterality Date  . corneal transplant left    . EYE SURGERY      Social History   Socioeconomic History  . Marital status: Married    Spouse name: Not on file  . Number of children: 0  . Years of education: Not on file  . Highest education level: Not on file  Occupational History  . 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 Smoking status: Former Smoker    Packs/day: 0.25    Years: 11.00  Pack years: 2.75    Types: Cigarettes    Quit date: 10/03/2017    Years since quitting: 2.1  . Smokeless tobacco: Never Used  Vaping Use  . Vaping Use: Never used  Substance and Sexual Activity  . Alcohol use: Yes    Alcohol/week: 1.0 standard drink    Types: 1 Standard drinks or equivalent per week  . Drug use: No  . Sexual activity: Yes    Partners: Male    Birth control/protection: None    Comment: SS partner  Other Topics Concern  . Not on file  Social History Narrative   Marital status: married x 4 years; happily married.       Lives; with husbnad       Employment: Librarian, academic at [employer]: socially; weekends      Alcohol: 1-3 drinks per week      Drugs: none      Exercise: none   Denies caffeine use    Social Determinants of Radio broadcast assistant Strain:   . Difficulty of Paying Living Expenses:   Food Insecurity:   . Worried About Charity fundraiser in the Last Year:   . Arboriculturist in the Last Year:   Transportation Needs:   . Film/video editor (Medical):   Marland Kitchen Lack of Transportation (Non-Medical):   Physical Activity:   . Days of Exercise per Week:   . Minutes of Exercise per Session:   Stress:   . Feeling of Stress :   Social Connections:   . Frequency of Communication with Friends and Family:   . Frequency of Social Gatherings with Friends and Family:   . Attends Religious Services:   . Active Member of Clubs or Organizations:   . Attends Archivist Meetings:   Marland Kitchen Marital Status:   Intimate Partner Violence:   . Fear of Current or Ex-Partner:   . Emotionally Abused:   Marland Kitchen Physically Abused:   . Sexually Abused:     Family History  Problem Relation Age of Onset  . Cancer Mother 23       pancreatic cancer  . Hypertension Father   . Heart disease Father 29       CAD  . Bipolar disorder Brother   . Diabetes Sister   . Allergic rhinitis Neg Hx   . Angioedema Neg Hx   . Asthma Neg Hx   . Eczema Neg Hx   . Immunodeficiency Neg Hx   . Urticaria Neg Hx      Review of Systems  Constitutional: Negative.  Negative for chills and fever.  HENT: Negative.  Negative for congestion and sore throat.   Respiratory: Negative.  Negative for cough and shortness of breath.   Cardiovascular: Negative.  Negative for chest pain and palpitations.  Gastrointestinal: Negative.  Negative for abdominal pain, blood in stool, diarrhea, melena, nausea and vomiting.  Genitourinary: Negative.  Negative for dysuria and hematuria.  Musculoskeletal: Negative.  Negative for back pain, myalgias  and neck pain.  Skin: Negative.  Negative for rash.  Neurological: Negative.  Negative for dizziness and headaches.  All other systems reviewed and are negative.  Vitals:   11/28/19 1611  BP: 124/74  Pulse: 82  Resp: 16  Temp: 97.9 F (36.6 C)  SpO2: 96%     Physical Exam Vitals reviewed.  Constitutional:      Appearance: Normal appearance.  HENT:     Head:  Normocephalic.     Mouth/Throat:     Mouth: Mucous membranes are moist.     Pharynx: Oropharynx is clear.  Eyes:     Extraocular Movements: Extraocular movements intact.     Conjunctiva/sclera: Conjunctivae normal.     Pupils: Pupils are equal, round, and reactive to light.  Cardiovascular:     Rate and Rhythm: Normal rate and regular rhythm.     Pulses: Normal pulses.     Heart sounds: Normal heart sounds.  Pulmonary:     Effort: Pulmonary effort is normal.     Breath sounds: Normal breath sounds.  Abdominal:     General: Bowel sounds are normal. There is no distension.     Palpations: Abdomen is soft. There is no mass.     Tenderness: There is no abdominal tenderness.  Musculoskeletal:        General: Normal range of motion.     Cervical back: Normal range of motion and neck supple.  Skin:    General: Skin is warm and dry.     Capillary Refill: Capillary refill takes less than 2 seconds.  Neurological:     General: No focal deficit present.     Mental Status: He is alert and oriented to person, place, and time.  Psychiatric:        Mood and Affect: Mood normal.        Behavior: Behavior normal.      ASSESSMENT & PLAN: Claron was seen today for annual exam.  Diagnoses and all orders for this visit:  Routine general medical examination at a health care facility  Screening for deficiency anemia -     CBC with Differential/Platelet  Screening for lipoid disorders -     Lipid panel  Screening for endocrine, metabolic and immunity disorder -     Comprehensive metabolic panel -     Hemoglobin  A1c    Patient Instructions       If you have lab work done today you will be contacted with your lab results within the next 2 weeks.  If you have not heard from [place] then please contact us. The fastest way to get your results is to register for My Chart.   IF you received an x-ray today, you will receive an invoice from [facility]. Please contact [facility] at [phone] with questions or concerns regarding your invoice.   IF you received labwork today, you will receive an invoice from [facility]. Please contact [facility] at [phone] with questions or concerns regarding your invoice.   Our billing staff will not be able to assist you with questions regarding bills from these companies.  You will be contacted with the lab results as soon as they are available. The fastest way to get your results is to activate your My Chart account. Instructions are located on the last page of this paperwork. If you have not heard from [place] regarding the results in 2 weeks, please contact this office.      Health Maintenance, Male Adopting a healthy lifestyle and getting preventive care are important in promoting health and wellness. Ask your health care provider about:  The right schedule for you to have regular tests and exams.  Things you can do on your own to prevent diseases and keep yourself healthy. What should I know about diet, weight, and exercise? Eat a healthy diet   Eat a diet that includes plenty of vegetables, fruits, low-fat dairy products, and lean protein.  Do not  eat a lot of foods that are high in solid fats, added sugars, or sodium. Maintain a healthy weight Body mass index (BMI) is a measurement that can be used to identify possible weight problems. It estimates body fat based on height and weight. Your health care provider can help determine your BMI and help you achieve or maintain a healthy weight. Get regular exercise Get regular exercise. This is  one of the most important things you can do for your health. Most adults should:  Exercise for at least 150 minutes each week. The exercise should increase your heart rate and make you sweat (moderate-intensity exercise).  Do strengthening exercises at least twice a week. This is in addition to the moderate-intensity exercise.  Spend less time sitting. Even light physical activity can be beneficial. Watch cholesterol and blood lipids Have your blood tested for lipids and cholesterol at 37 years of age, then have this test every 5 years. You may need to have your cholesterol levels checked more often if:  Your lipid or cholesterol levels are high.  You are older than 37 years of age.  You are at high risk for heart disease. What should I know about cancer screening? Many types of cancers can be detected early and may often be prevented. Depending on your health history and family history, you may need to have cancer screening at various ages. This may include screening for:  Colorectal cancer.  Prostate cancer.  Skin cancer.  Lung cancer. What should I know about heart disease, diabetes, and high blood pressure? Blood pressure and heart disease  High blood pressure causes heart disease and increases the risk of stroke. This is more likely to develop in people who have high blood pressure readings, are of African descent, or are overweight.  Talk with your health care provider about your target blood pressure readings.  Have your blood pressure checked: ? Every 3-5 years if you are 50-41 years of age. ? Every year if you are 40 years old or older.  If you are between the ages of 25 and 20 and are a current or former smoker, ask your health care provider if you should have a one-time screening for abdominal aortic aneurysm (AAA). Diabetes Have regular diabetes screenings. This checks your fasting blood sugar level. Have the screening done:  Once every three years after age 2 if  you are at a normal weight and have a low risk for diabetes.  More often and at a younger age if you are overweight or have a high risk for diabetes. What should I know about preventing infection? Hepatitis B If you have a higher risk for hepatitis B, you should be screened for this virus. Talk with your health care provider to find out if you are at risk for hepatitis B infection. Hepatitis C Blood testing is recommended for:  Everyone born from [birth year] through 1963.  Anyone with known risk factors for hepatitis C. Sexually transmitted infections (STIs)  You should be screened each year for STIs, including gonorrhea and chlamydia, if: ? You are sexually active and are younger than 37 years of age. ? You are older than 37 years of age and your health care provider tells you that you are at risk for this type of infection. ? Your sexual activity has changed since you were last screened, and you are at increased risk for chlamydia or gonorrhea. Ask your health care provider if you are at risk.  Ask your health care provider  about whether you are at high risk for HIV. Your health care provider may recommend a prescription medicine to help prevent HIV infection. If you choose to take medicine to prevent HIV, you should first get tested for HIV. You should then be tested every 3 months for as long as you are taking the medicine. Follow these instructions at home: Lifestyle  Do not use any products that contain nicotine or tobacco, such as cigarettes, e-cigarettes, and chewing tobacco. If you need help quitting, ask your health care provider.  Do not use street drugs.  Do not share needles.  Ask your health care provider for help if you need support or information about quitting drugs. Alcohol use  Do not drink alcohol if your health care provider tells you not to drink.  If you drink alcohol: ? Limit how much you have to 0-2 drinks a day. ? Be aware of how much alcohol is in your drink.  In the [place] one drink equals one 12 oz bottle of beer (355 mL), one 5 oz glass of wine (148 mL), or one 1 oz glass of hard liquor (44 mL). General instructions  Schedule regular health, dental, and eye exams.  Stay current with your vaccines.  Tell your health care provider if: ? You often feel depressed. ? You have ever been abused or do not feel safe at home. Summary  Adopting a healthy lifestyle and getting preventive care are important in promoting health and wellness.  Follow your health care provider's instructions about healthy diet, exercising, and getting tested or screened for diseases.  Follow your health care provider's instructions on monitoring your cholesterol and blood pressure. This information is not intended to replace advice given to you by your health care provider. Make sure you discuss any questions you have with your health care provider. Document Revised: 03/31/2018 Document Reviewed: 03/31/2018 Elsevier Patient Education  2020 [employer]      Agustina Caroli, MD [facility]

## 2019-11-29 LAB — HEMOGLOBIN A1C
Est. average glucose Bld gHb Est-mCnc: 120 mg/dL
Hgb A1c MFr Bld: 5.8 % — ABNORMAL HIGH (ref 4.8–5.6)

## 2019-11-29 LAB — LIPID PANEL
Chol/HDL Ratio: 5.3 ratio — ABNORMAL HIGH (ref 0.0–5.0)
Cholesterol, Total: 185 mg/dL (ref 100–199)
HDL: 35 mg/dL — ABNORMAL LOW (ref >39)
LDL Chol Calc (NIH): 102 mg/dL — ABNORMAL HIGH (ref 0–99)
Triglycerides: 278 mg/dL — ABNORMAL HIGH (ref 0–149)
VLDL Cholesterol Cal: 48 mg/dL — ABNORMAL HIGH (ref 5–40)

## 2019-11-29 LAB — CBC WITH DIFFERENTIAL/PLATELET
Basophils Absolute: 0.1 10*3/uL (ref 0.0–0.2)
Basos: 1 %
EOS (ABSOLUTE): 0.3 10*3/uL (ref 0.0–0.4)
Eos: 3 %
Hematocrit: 46.8 % (ref 37.5–51.0)
Hemoglobin: 15.6 g/dL (ref 13.0–17.7)
Immature Grans (Abs): 0 10*3/uL (ref 0.0–0.1)
Immature Granulocytes: 0 %
Lymphocytes Absolute: 2.2 10*3/uL (ref 0.7–3.1)
Lymphs: 29 %
MCH: 29.9 pg (ref 26.6–33.0)
MCHC: 33.3 g/dL (ref 31.5–35.7)
MCV: 90 fL (ref 79–97)
Monocytes Absolute: 0.6 10*3/uL (ref 0.1–0.9)
Monocytes: 8 %
Neutrophils Absolute: 4.4 10*3/uL (ref 1.4–7.0)
Neutrophils: 59 %
Platelets: 195 10*3/uL (ref 150–450)
RBC: 5.21 x10E6/uL (ref 4.14–5.80)
RDW: 13.1 % (ref 11.6–15.4)
WBC: 7.6 10*3/uL (ref 3.4–10.8)

## 2019-11-29 LAB — COMPREHENSIVE METABOLIC PANEL
ALT: 26 IU/L (ref 0–44)
AST: 24 IU/L (ref 0–40)
Albumin/Globulin Ratio: 2 (ref 1.2–2.2)
Albumin: 5.1 g/dL — ABNORMAL HIGH (ref 4.0–5.0)
Alkaline Phosphatase: 81 IU/L (ref 48–121)
BUN/Creatinine Ratio: 23 — ABNORMAL HIGH (ref 9–20)
BUN: 19 mg/dL (ref 6–20)
Bilirubin Total: 0.5 mg/dL (ref 0.0–1.2)
CO2: 24 mmol/L (ref 20–29)
Calcium: 9.6 mg/dL (ref 8.7–10.2)
Chloride: 101 mmol/L (ref 96–106)
Creatinine, Ser: 0.84 mg/dL (ref 0.76–1.27)
GFR calc Af Amer: 130 mL/min/{1.73_m2} (ref >59)
GFR calc non Af Amer: 113 mL/min/{1.73_m2} (ref >59)
Globulin, Total: 2.6 g/dL (ref 1.5–4.5)
Glucose: 98 mg/dL (ref 65–99)
Potassium: 4.1 mmol/L (ref 3.5–5.2)
Sodium: 138 mmol/L (ref 134–144)
Total Protein: 7.7 g/dL (ref 6.0–8.5)

## 2019-12-04 ENCOUNTER — Encounter: Payer: Self-pay | Admitting: Emergency Medicine

## 2019-12-04 DIAGNOSIS — Z2889 Immunization not carried out for other reason: Secondary | ICD-10-CM

## 2019-12-13 ENCOUNTER — Other Ambulatory Visit: Payer: Self-pay

## 2019-12-13 ENCOUNTER — Ambulatory Visit: Payer: Managed Care, Other (non HMO) | Admitting: Emergency Medicine

## 2019-12-13 DIAGNOSIS — Z2889 Immunization not carried out for other reason: Secondary | ICD-10-CM

## 2019-12-14 LAB — VARICELLA ZOSTER ANTIBODY, IGG: Varicella zoster IgG: 451 index (ref >165)

## 2020-01-06 ENCOUNTER — Other Ambulatory Visit: Payer: Self-pay | Admitting: *Deleted

## 2020-01-06 ENCOUNTER — Other Ambulatory Visit: Payer: Managed Care, Other (non HMO)

## 2020-01-06 DIAGNOSIS — Z20822 Contact with and (suspected) exposure to covid-19: Secondary | ICD-10-CM

## 2020-01-09 LAB — NOVEL CORONAVIRUS, NAA: SARS-CoV-2, NAA: NOT DETECTED

## 2020-02-07 ENCOUNTER — Other Ambulatory Visit: Payer: Self-pay

## 2020-02-07 ENCOUNTER — Ambulatory Visit (INDEPENDENT_AMBULATORY_CARE_PROVIDER_SITE_OTHER): Payer: PRIVATE HEALTH INSURANCE | Admitting: Clinical

## 2020-02-07 DIAGNOSIS — F39 Unspecified mood [affective] disorder: Secondary | ICD-10-CM

## 2020-02-07 DIAGNOSIS — Z87828 Personal history of other (healed) physical injury and trauma: Secondary | ICD-10-CM
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
## 2020-02-13 ENCOUNTER — Ambulatory Visit (INDEPENDENT_AMBULATORY_CARE_PROVIDER_SITE_OTHER): Payer: PRIVATE HEALTH INSURANCE | Admitting: Psychiatry

## 2020-02-13 ENCOUNTER — Encounter (HOSPITAL_COMMUNITY): Payer: Self-pay | Admitting: Psychiatry

## 2020-02-13 DIAGNOSIS — Z87828 Personal history of other (healed) physical injury and trauma: Secondary | ICD-10-CM | POA: Diagnosis not present

## 2020-02-13 DIAGNOSIS — F411 Generalized anxiety disorder: Secondary | ICD-10-CM | POA: Diagnosis not present

## 2020-02-13 DIAGNOSIS — F39 Unspecified mood [affective] disorder: Secondary | ICD-10-CM

## 2020-02-13 MED ORDER — FLUOXETINE HCL 10 MG PO CAPS: 10.0000 mg | ORAL_CAPSULE | Freq: Every day | ORAL | 0 refills | Status: DC
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
## 2020-02-14 ENCOUNTER — Ambulatory Visit (INDEPENDENT_AMBULATORY_CARE_PROVIDER_SITE_OTHER): Payer: PRIVATE HEALTH INSURANCE | Admitting: Clinical

## 2020-02-14 DIAGNOSIS — Z87828 Personal history of other (healed) physical injury and trauma: Secondary | ICD-10-CM

## 2020-02-14 DIAGNOSIS — F39 Unspecified mood [affective] disorder: Secondary | ICD-10-CM

## 2020-02-14 DIAGNOSIS — F411 Generalized anxiety disorder: Secondary | ICD-10-CM

## 2020-02-14 NOTE — Progress Notes (Signed)
   THERAPIST PROGRESS NOTE  Session Time: 1pm  Participation Level: Active  Behavioral Response: CasualAlertAnxious  Type of Therapy: Individual Therapy  Treatment Goals addressed: Anxiety   Cecile Rodrecus Belsky' Person Centered Plan, completed on 02/14/2020 Meet with clinician virtually, face to face or via phone weekly or biweekly for therapy sessions to assess progress towards personal goals and receive assistance/feedback as needed to address barriers to success.  Meet with PCP/psychiatrist routinely to address efficacy of medications and adjust regimen/dose as needed. Take medications as prescribed to aid in symptom management and improvement in daily functioning. Demonstrate improved self esteem through assertiveness, inward sense of self-worth, greater eye contact and identification of positive traits in self talk messages. Reduce overall frequency, intensity and duration of anxiety and depressive symptoms so that daily functioning is not impaired, and mood is improved.  Interventions: CBT and Other: psychoeducation   I connected with Alexander Osborne, on 02/14/20 at 1:00pm  by video and verified that I am speaking with the correct person using two identifiers.  I discussed the limitations, risks, security and privacy concerns of performing an evaluation and management service by video and the availability of in person appointments. I also discussed with the patient that there may be a patient responsible charge related to this service. The patient expressed understanding and agreed to proceed.  I discussed the assessment and treatment plan with the patient. The patient was provided an opportunity to ask questions and all were answered. The patient agreed with the plan and demonstrated an understanding of the instructions.  The patient was advised to call back or seek an in-person evaluation if the symptoms worsen or if the condition fails to improve as anticipated.   I provided 50 minutes of  non-face-to-face time during this encounter.  Patient: Pt home Provider: [facility]  Summary: Alexander Osborne is a 39 y.o. male who presents with a history of mood instability, anxiety and trauma. Pt participated in completion of treatment plan. When asked what has gone well since last visit, pt reports he recently went to the movies with a friend and participated in medication management appointment. Pt states he was prescribed antidepressant and is unsure if the nausea he has been experiencing is a side effect of the medication. Pt reports his depression and anxiety was much worse last week, rating it an 8/10. This week pt says it is "mild rating it 4/10". Pt discussed self esteem concerns and how it causes impairment in his relationship and ability to perform in the workforce.  Suicidal/Homicidal: Pt denies plan or intent to harm himself or others. Pt encouraged to call 911 or go to closest emergency department in the event of an emergency.  Therapist Response: CSW assisted pt in completion of person centered plan. CSW provided pt with information on cycle of anxiety. CSW provided pt with information on challenging anxious thoughts. CSW assigned homework assignment-practice challenging anxious thoughts and discuss experience during next session. CSW assessed for changes in daily functioning, mood and behavior. CSW demonstrated how to reframe negative thinking and encouraged him to practice challenging self defeating thoughts.  Plan: Return again in [place].  Diagnosis: Axis I: Unspecified mood disorder, history of trauma, generalized anxiety disorder    Axis II: No diagnosis       Suzan Slick, LCSW 02/14/2020

## 2020-02-21 ENCOUNTER — Telehealth (INDEPENDENT_AMBULATORY_CARE_PROVIDER_SITE_OTHER): Payer: PRIVATE HEALTH INSURANCE | Admitting: Clinical

## 2020-02-21 ENCOUNTER — Other Ambulatory Visit: Payer: Self-pay

## 2020-02-21 DIAGNOSIS — F39 Unspecified mood [affective] disorder: Secondary | ICD-10-CM | POA: Diagnosis not present

## 2020-02-21 DIAGNOSIS — F411 Generalized anxiety disorder: Secondary | ICD-10-CM

## 2020-02-21 DIAGNOSIS — Z87828 Personal history of other (healed) physical injury and trauma: Secondary | ICD-10-CM | POA: Diagnosis not present

## 2020-02-21 NOTE — Progress Notes (Signed)
   THERAPIST PROGRESS NOTE  Session Time: 2pm  Participation Level: Active  Behavioral Response: CasualAlertEuthymic  Type of Therapy: Individual Therapy  Treatment Goals addressed: Anxiety  Interventions: CBT  Virtual Visit via Video Note  I connected with Alexander Osborne on 02/21/20 at  2:00 PM EDT by a video enabled telemedicine application and verified that I am speaking with the correct person using two identifiers.  Location: Patient: Home   Provider: Orbie Pyo   I discussed the limitations of evaluation and management by telemedicine and the availability of in person appointments. The patient expressed understanding and agreed to proceed.  I discussed the assessment and treatment plan with the patient. The patient was provided an opportunity to ask questions and all were answered. The patient agreed with the plan and demonstrated an understanding of the instructions.  The patient was advised to call back or seek an in-person evaluation if the symptoms worsen or if the condition fails to improve as anticipated.  I provided 45 minutes of non-face-to-face time during this encounter.  Summary: Alexander Osborne is a 39 y.o. male who presents with mood instability, anxiety and trauma.  Pt reports he started journaling and has found it to be therapeutic. Pt also reports improved mood and feeling "more hopeful" Pt described worrying about issues related to family, employment, health, among other things. Pt discussed motor tension and insecurity when he is in public settings such as gym.  Suicidal/Homicidal: Pt denies plan or intent to harm himself or others. Pt encouraged to call 911 or go to closest emergency department in the event of an emergency.  Therapist Response: Assessed for changes in daily functioning, mood and behavior. Reviewed information provided on challenging anxious thoughts and addressed questions/concerns. Encouraged pt to continue practicing intervention and  will further be discussed during next session. Pt assisted in identifying cognitive distortions that generate anxiety.  Plan: Return again in 1 weeks.  Diagnosis: Axis I: Unspecified mood disorder    History of trauma    Generalized anxiety disorder    Axis II: No diagnosis    Suzan Slick, LCSW 02/21/2020

## 2020-03-05 ENCOUNTER — Other Ambulatory Visit (HOSPITAL_COMMUNITY): Payer: Self-pay | Admitting: Psychiatry

## 2020-03-07 ENCOUNTER — Encounter (HOSPITAL_COMMUNITY): Payer: Self-pay | Admitting: Psychiatry

## 2020-03-07 ENCOUNTER — Telehealth (INDEPENDENT_AMBULATORY_CARE_PROVIDER_SITE_OTHER): Payer: PRIVATE HEALTH INSURANCE | Admitting: Psychiatry

## 2020-03-07 DIAGNOSIS — F411 Generalized anxiety disorder: Secondary | ICD-10-CM | POA: Diagnosis not present

## 2020-03-07 DIAGNOSIS — Z87828 Personal history of other (healed) physical injury and trauma: Secondary | ICD-10-CM | POA: Diagnosis not present

## 2020-03-07 DIAGNOSIS — F39 Unspecified mood [affective] disorder: Secondary | ICD-10-CM

## 2020-03-07 MED ORDER — FLUOXETINE HCL 20 MG PO CAPS: 20.0000 mg | ORAL_CAPSULE | Freq: Every day | ORAL | 0 refills | Status: DC
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
## 2020-03-08 ENCOUNTER — Ambulatory Visit (INDEPENDENT_AMBULATORY_CARE_PROVIDER_SITE_OTHER): Payer: PRIVATE HEALTH INSURANCE | Admitting: Clinical

## 2020-03-08 ENCOUNTER — Other Ambulatory Visit: Payer: Self-pay

## 2020-03-08 DIAGNOSIS — F39 Unspecified mood [affective] disorder: Secondary | ICD-10-CM | POA: Diagnosis not present

## 2020-03-08 DIAGNOSIS — F411 Generalized anxiety disorder: Secondary | ICD-10-CM

## 2020-03-08 NOTE — Progress Notes (Signed)
   THERAPIST PROGRESS NOTE  Session Time: 1pm  Participation Level: Active  Behavioral Response: CasualAlertEuthymic  Type of Therapy: Individual Therapy  Treatment Goals addressed: Anxiety  Interventions: CBT and Strength-based   Virtual Visit via Video Note  I connected with Alexander Osborne on 03/08/20 at  1:00 PM EST by a video enabled telemedicine application and verified that I am speaking with the correct person using two identifiers.  Location: Patient: Home Provider: Orbie Pyo   I discussed the limitations of evaluation and management by telemedicine and the availability of in person appointments. The patient expressed understanding and agreed to proceed.   I discussed the assessment and treatment plan with the patient. The patient was provided an opportunity to ask questions and all were answered. The patient agreed with the plan and demonstrated an understanding of the instructions.   The patient was advised to call back or seek an in-person evaluation if the symptoms worsen or if the condition fails to improve as anticipated.  I provided 50 minutes of non-face-to-face time during this encounter.  Summary: Alexander Osborne is a 39 y.o. male who presents with a history of mood instability and anxiety. Pt presents in euthymic mood today and reports noticing improvement in his mood. Pt says he recently started working part time job and was able to overcome fear of returning back to work force. Pt states he was unable to obtain homework that was emailed to him during last session. When praised for the progress he is making, pt demonstrated difficulty accepting the praise. Pt demonstrates some insight and is working toward achieving treatment goal of reducing anxiety and depressive sxs. Pt will need to continue working toward achieving goal of improving self esteem.    Suicidal/Homicidal:Pt denies plan or intent to harm himself or others. Pt encouraged to call 911 or go to  closest emergency department in the event of an emergency.  Therapist Response: CSW reviewed with pt ways to challenge anxious thoughts. CSW demonstrated reframing negative thoughts and replacing with positive self talk. Prompted pt to identify times he has displayed the following qualities: courage, kindness, selflessness, sacrifice. Pt unable to provide examples of kindness and selflessness during session but was encouraged to write about these times and discuss during next session. Processed with pt difficulty accepting compliment and where it stems from.   Plan: Return again in 1 weeks.  Diagnosis: Axis I:Unspecified mood disorder, history of trauma, generalized anxiety disorder    Axis II: No diagnosis    Suzan Slick, LCSW 03/08/2020

## 2020-03-26 ENCOUNTER — Ambulatory Visit: Payer: Managed Care, Other (non HMO) | Admitting: Allergy and Immunology

## 2020-04-09 ENCOUNTER — Telehealth (HOSPITAL_COMMUNITY): Payer: PRIVATE HEALTH INSURANCE | Admitting: Psychiatry

## 2020-04-15 ENCOUNTER — Other Ambulatory Visit (HOSPITAL_COMMUNITY): Payer: Self-pay | Admitting: Psychiatry

## 2020-05-07 DIAGNOSIS — F331 Major depressive disorder, recurrent, moderate: Secondary | ICD-10-CM | POA: Diagnosis not present

## 2020-05-07 DIAGNOSIS — F419 Anxiety disorder, unspecified: Secondary | ICD-10-CM | POA: Diagnosis not present

## 2020-05-12 DIAGNOSIS — F419 Anxiety disorder, unspecified: Secondary | ICD-10-CM | POA: Diagnosis not present

## 2020-05-12 DIAGNOSIS — F331 Major depressive disorder, recurrent, moderate: Secondary | ICD-10-CM | POA: Diagnosis not present

## 2020-05-12 DIAGNOSIS — F3181 Bipolar II disorder: Secondary | ICD-10-CM | POA: Diagnosis not present

## 2020-05-15 ENCOUNTER — Encounter: Payer: Self-pay | Admitting: Family Medicine

## 2020-05-15 ENCOUNTER — Telehealth (INDEPENDENT_AMBULATORY_CARE_PROVIDER_SITE_OTHER): Payer: BC Managed Care – PPO | Admitting: Family Medicine

## 2020-05-15 DIAGNOSIS — K5732 Diverticulitis of large intestine without perforation or abscess without bleeding: Secondary | ICD-10-CM | POA: Diagnosis not present

## 2020-05-15 MED ORDER — CIPROFLOXACIN HCL 500 MG PO TABS: 500.0000 mg | ORAL_TABLET | Freq: Two times a day (BID) | ORAL | 0 refills | Status: AC

## 2020-05-15 MED ORDER — METRONIDAZOLE 500 MG PO TABS: 500.0000 mg | ORAL_TABLET | Freq: Three times a day (TID) | ORAL | 0 refills | Status: DC

## 2020-05-15 NOTE — Patient Instructions (Addendum)
Diverticulitis  Diverticulitis is when small pouches in your colon (large intestine) get infected or swollen. This causes pain in the belly (abdomen) and watery poop (diarrhea). These pouches are called diverticula. The pouches form in people who have a condition called diverticulosis. What are the causes? This condition may be caused by poop (stool) that gets trapped in the pouches in your colon. The poop lets germs (bacteria) grow in the pouches. This causes the infection. What increases the risk? You are more likely to get this condition if you have small pouches in your colon. The risk is higher if:  You are overweight or very overweight (obese).  You do not exercise enough.  You drink alcohol.  You smoke or use products with tobacco in them.  You eat a diet that has a lot of red meat such as beef, pork, or lamb.  You eat a diet that does not have enough fiber in it.  You are older than 38 years of age. What are the signs or symptoms?  Pain in the belly. Pain is often on the left side, but it may be in other areas.  Fever and feeling cold.  Feeling like you may vomit.  Vomiting.  Having cramps.  Feeling full.  Changes to how often you poop.  Blood in your poop. How is this treated? Most cases are treated at home by:  Taking over-the-counter pain medicines.  Following a clear liquid diet.  Taking antibiotic medicines.  Resting. Very bad cases may need to be treated at a hospital. This may include:  Not eating or drinking.  Taking prescription pain medicine.  Getting antibiotic medicines through an IV tube.  Getting fluid and food through an IV tube.  Having surgery. When you are feeling better, your doctor may tell you to have a test to check your colon (colonoscopy). Follow these instructions at home: Medicines  Take over-the-counter and prescription medicines only as told by your doctor. These include: ? Antibiotics. ? Pain  medicines. ? Fiber pills. ? Probiotics. ? Stool softeners.  If you were prescribed an antibiotic medicine, take it as told by your doctor. Do not stop taking the antibiotic even if you start to feel better.  Ask your doctor if the medicine prescribed to you requires you to avoid driving or using machinery. Eating and drinking  Follow a diet as told by your doctor.  When you feel better, your doctor may tell you to change your diet. You may need to eat a lot of fiber. Fiber makes it easier to poop (have a bowel movement). Foods with fiber include: ? Berries. ? Beans. ? Lentils. ? Green vegetables.  Avoid eating red meat.   General instructions  Do not use any products that contain nicotine or tobacco, such as cigarettes, e-cigarettes, and chewing tobacco. If you need help quitting, ask your doctor.  Exercise 3 or more times a week. Try to get 30 minutes each time. Exercise enough to sweat and make your heart beat faster.  Keep all follow-up visits as told by your doctor. This is important. Contact a doctor if:  Your pain does not get better.  You are not pooping like normal. Get help right away if:  Your pain gets worse.  Your symptoms do not get better.  Your symptoms get worse very fast.  You have a fever.  You vomit more than one time.  You have poop that is: ? Bloody. ? Black. ? Tarry. Summary  This condition happens  when small pouches in your colon get infected or swollen.  Take medicines only as told by your doctor.  Follow a diet as told by your doctor.  Keep all follow-up visits as told by your doctor. This is important. This information is not intended to replace advice given to you by your health care provider. Make sure you discuss any questions you have with your health care provider. Document Revised: 01/17/2019 Document Reviewed: 01/17/2019 Elsevier Patient Education  2021 [employer].    If you have lab work done today you will be  contacted with your lab results within the next 2 weeks.  If you have not heard from [place] then please contact us. The fastest way to get your results is to register for My Chart.   IF you received an x-ray today, you will receive an invoice from [facility]. Please contact [facility] at [phone] with questions or concerns regarding your invoice.   IF you received labwork today, you will receive an invoice from [facility]. Please contact [facility] at [phone] with questions or concerns regarding your invoice.   Our billing staff will not be able to assist you with questions regarding bills from these companies.  You will be contacted with the lab results as soon as they are available. The fastest way to get your results is to activate your My Chart account. Instructions are located on the last page of this paperwork. If you have not heard from [place] regarding the results in 2 weeks, please contact this office.

## 2020-05-15 NOTE — Progress Notes (Signed)
Virtual Visit Note  I connected with patient on 05/15/20 at [place] by telephone due to unable to work Epic video visit and verified that I am speaking with the correct person using two identifiers. Alexander Osborne is currently located at home and no family members are currently with them during visit. The provider, Laurita Quint Brittnee Gaetano, FNP is located in their office at time of visit.  I discussed the limitations, risks, security and privacy concerns of performing an evaluation and management service by telephone and the availability of in person appointments. I also discussed with the patient that there may be a patient responsible charge related to this service. The patient expressed understanding and agreed to proceed.   I provided 20 minutes of non-face-to-face time during this encounter.  Chief Complaint  Patient presents with  . Nausea    Stomach pain, lightheaded started Saturday 1/22    HPI  Stomach ache started Saturday In the bathroom for an hour and after used the bathroom felt better BM was normal Last night had the same issue where had stomach pain and the same improved after BM Frequent vomiting in December that went away Pain is the worst lower abdominal Denies fever and chills Does have history of hemorrhoids not constipation Had not drank since October prior to this episode   Allergies  Allergen Reactions  . Shellfish Allergy Other (See Comments)    Patient does not remember    Prior to Admission medications   Medication Sig Start Date End Date Taking? Authorizing Provider  albuterol (VENTOLIN HFA) 108 (90 Base) MCG/ACT inhaler Inhale 2 puffs into the lungs every 6 (six) hours as needed for wheezing or shortness of breath. 09/26/19  Yes Bobbitt, Sed[place] Muta, MD  FLUoxetine (PROZAC) 20 MG capsule Take 1 capsule (20 mg total) by mouth daily. 03/07/20  Yes Merian Capron, MD  glucose blood test strip Check sugar once daily  Dx: DMII controlled, non-insulin, without  complications 2/94/76  Yes Wardell Honour, MD  glucose monitoring kit (FREESTYLE) monitoring kit 1 each by Does not apply route as needed for other. Dx: DMII non-insulin dependent without complications; controlled; 05/04/17  Yes Wardell Honour, MD  Lancets MISC Check sugar once daily dx DMII controlled, non-insulin, without complications 5/46/50  Yes Wardell Honour, MD  loratadine (CLARITIN) 10 MG tablet Take 10 mg by mouth daily.   Yes [provider]    Past Medical History:  Diagnosis Date  . Allergy   . Asthma   . Diabetes mellitus without complication [facility])     Past Surgical History:  Procedure Laterality Date  . corneal transplant left    . EYE SURGERY      Social History   Tobacco Use  . Smoking status: Former Smoker    Packs/day: 0.25    Years: 11.00    Pack years: 2.75    Types: Cigarettes    Quit date: 10/03/2017    Years since quitting: 2.6  . Smokeless tobacco: Never Used  Substance Use Topics  . Alcohol use: Yes    Alcohol/week: 1.0 standard drink    Types: 1 Standard drinks or equivalent per week    Family History  Problem Relation Age of Onset  . Cancer Mother 54       pancreatic cancer  . Hypertension Father   . Heart disease Father 75       CAD  . Bipolar disorder Brother   . Diabetes Sister   . Allergic rhinitis  Neg Hx   . Angioedema Neg Hx   . Asthma Neg Hx   . Eczema Neg Hx   . Immunodeficiency Neg Hx   . Urticaria Neg Hx     Review of Systems  Constitutional: Negative for chills, fever, malaise/fatigue and weight loss.  Respiratory: Negative for cough, sputum production and shortness of breath.   Cardiovascular: Negative for chest pain and palpitations.  Gastrointestinal: Positive for abdominal pain, constipation, diarrhea, nausea and vomiting. Negative for blood in stool.  Neurological: Positive for dizziness. Negative for headaches.    Objective  Constitutional:      General: Not in acute distress.    Appearance: Normal  appearance. Not ill-appearing.   Pulmonary:     Effort: Pulmonary effort is normal. No respiratory distress.  Neurological:     Mental Status: Alert and oriented to person, place, and time.  Psychiatric:        Mood and Affect: Mood normal.        Behavior: Behavior normal.     ASSESSMENT and PLAN  Problem List Items Addressed This Visit   None   Visit Diagnoses    Diverticulitis of colon    -  Primary   Relevant Medications   metroNIDAZOLE (FLAGYL) 500 MG tablet   ciprofloxacin (CIPRO) 500 MG tablet      Plan . Discussed high fiber diet . Take Cipro and metronidazole for 7 days, r/se/b discussed . Discussed RTC/ED precautions  Return if symptoms worsen or fail to improve.    The above assessment and management plan was discussed with the patient. The patient verbalized understanding of and has agreed to the management plan. Patient is aware to call the clinic if symptoms persist or worsen. Patient is aware when to return to the clinic for a follow-up visit. Patient educated on when it is appropriate to go to the emergency department.     [name] Laniesha Das, FNP-BC Primary Care at [place], [place] [address] Ph.  [phone] Fax [phone]

## 2020-05-17 ENCOUNTER — Ambulatory Visit
Admission: RE | Admit: 2020-05-17 | Discharge: 2020-05-17 | Disposition: A | Discharge status: Home / Self Care | Payer: BC Managed Care – PPO | Source: Ambulatory Visit | Attending: Urgent Care | Admitting: Urgent Care

## 2020-05-17 ENCOUNTER — Other Ambulatory Visit: Payer: Self-pay

## 2020-05-17 ENCOUNTER — Telehealth: Payer: Self-pay | Admitting: Emergency Medicine

## 2020-05-17 VITALS — BP 127/81 | HR 97 | Temp 99.4°F | Resp 18

## 2020-05-17 DIAGNOSIS — B349 Viral infection, unspecified: Secondary | ICD-10-CM | POA: Diagnosis not present

## 2020-05-17 DIAGNOSIS — Z1152 Encounter for screening for COVID-19: Secondary | ICD-10-CM

## 2020-05-17 MED ORDER — CETIRIZINE HCL 10 MG PO TABS: 10.0000 mg | ORAL_TABLET | Freq: Every day | ORAL | 0 refills | Status: DC

## 2020-05-17 MED ORDER — PROMETHAZINE-DM 6.25-15 MG/5ML PO SYRP: 5.0000 mL | ORAL_SOLUTION | Freq: Every evening | ORAL | 0 refills | Status: DC | PRN

## 2020-05-17 MED ORDER — PSEUDOEPHEDRINE HCL 60 MG PO TABS: 60.0000 mg | ORAL_TABLET | Freq: Three times a day (TID) | ORAL | 0 refills | Status: DC | PRN

## 2020-05-17 MED ORDER — BENZONATATE 100 MG PO CAPS: 100.0000 mg | ORAL_CAPSULE | Freq: Three times a day (TID) | ORAL | 0 refills | Status: DC | PRN

## 2020-05-17 NOTE — Telephone Encounter (Signed)
Pt needs virtual for COVID this was not discussed yesterday

## 2020-05-17 NOTE — ED Triage Notes (Signed)
Pt is on two different antibiotics prescribed by pcp on Monday 05/14/20

## 2020-05-17 NOTE — ED Triage Notes (Signed)
Pt tested positive for Covid at home today. Presents with runny nose, coughing, sore throat, headache x 1 day.

## 2020-05-17 NOTE — Telephone Encounter (Signed)
Patient returned my call. No available virtual appts until next Monday. Patient stated he will go to [facility] on [address] Instead.

## 2020-05-17 NOTE — Discharge Instructions (Addendum)

## 2020-05-17 NOTE — Telephone Encounter (Signed)
Left message on patient's voicemail to advise him to call to schedule a virtual appointment for COVID symptoms.

## 2020-05-17 NOTE — Telephone Encounter (Signed)
Patient seen by provider on 1/26. Tested positive for COVID this morning using home testing kit.   Symptoms: cough, congestion, terrible headache above both eyes, dizzy. No temp. Nausea 2 days ago, but not today.  Patient seeking guidance. Please advise at [phone]

## 2020-05-17 NOTE — ED Provider Notes (Signed)
[place]   MRN: [id number] DOB: [birth year]  Subjective:   Alexander Osborne is a 40 y.o. male presenting for 1 day history of runny nose, coughing, sore throat and headache.  Patient tested himself for COVID-19 yesterday and was negative.  This was prompted after he had an episode of nausea, vomiting and diarrhea, lower abdominal pain on Monday.  Symptoms resolved after that day.  However, he went and saw a different provider and was prescribed Augmentin and Flagyl.  Was told that they were worried about him having diverticulitis.  No CT scan was obtained.  Denies ongoing GI symptoms.  He has a remote history of asthma, diabetes that is now resolved.  Denies smoking cigarettes.  No chest pain, shortness of breath.  No current facility-administered medications for this encounter.  Current Outpatient Medications:  .  albuterol (VENTOLIN HFA) 108 (90 Base) MCG/ACT inhaler, Inhale 2 puffs into the lungs every 6 (six) hours as needed for wheezing or shortness of breath., Disp: 18 g, Rfl: 1 .  ciprofloxacin (CIPRO) 500 MG tablet, Take 1 tablet (500 mg total) by mouth 2 (two) times daily for 7 days., Disp: 14 tablet, Rfl: 0 .  FLUoxetine (PROZAC) 20 MG capsule, Take 1 capsule (20 mg total) by mouth daily., Disp: 30 capsule, Rfl: 0 .  glucose blood test strip, Check sugar once daily  Dx: DMII controlled, non-insulin, without complications, Disp: 509 each, Rfl: 12 .  glucose monitoring kit (FREESTYLE) monitoring kit, 1 each by Does not apply route as needed for other. 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  Lancets MISC, Check sugar once daily dx DMII controlled, non-insulin, without complications, Disp: 326 each, Rfl: 11 .  loratadine (CLARITIN) 10 MG tablet, Take 10 mg by mouth daily., Disp: , Rfl:  .  metroNIDAZOLE (FLAGYL) 500 MG tablet, Take 1 tablet (500 mg total) by mouth 3 (three) times daily., Disp: 21 tablet, Rfl: 0    Allergies  Allergen Reactions  . Shellfish Allergy Other (See Comments)    Patient does not remember    Past Medical History:  Diagnosis Date  . Allergy   . Asthma   . Diabetes mellitus without complication [facility])      Past Surgical History:  Procedure Laterality Date  . corneal transplant left    . EYE SURGERY      Family History  Problem Relation Age of Onset  . Cancer Mother 14       pancreatic cancer  . Hypertension Father   . Heart disease Father 34       CAD  . Bipolar disorder Brother   . Diabetes Sister   . Allergic rhinitis Neg Hx   . Angioedema Neg Hx   . Asthma Neg Hx   . Eczema Neg Hx   . Immunodeficiency Neg Hx   . Urticaria Neg Hx     Social History   Tobacco Use  . Smoking status: Former Smoker    Packs/day: 0.25    Years: 11.00    Pack years: 2.75    Types: Cigarettes    Quit date: 10/03/2017    Years since quitting: 2.6  . Smokeless tobacco: Never Used  Vaping Use  . Vaping Use: Never used  Substance Use Topics  . Alcohol use: Yes    Alcohol/week: 1.0 standard drink    Types: 1 Standard drinks or equivalent per week  . Drug use: No    ROS   Objective:  Vitals: BP 127/81 (BP Location: Left Arm)   Pulse 97   Temp 99.4 F (37.4 C) (Oral)   Resp 18   SpO2 97%   Physical Exam Constitutional:      General: He is not in acute distress.    Appearance: Normal appearance. He is well-developed and normal weight. He is not ill-appearing, toxic-appearing or diaphoretic.  HENT:     Head: Normocephalic and atraumatic.     Right Ear: External ear normal.     Left Ear: External ear normal.     Nose: Nose normal.     Mouth/Throat:     Mouth: Mucous membranes are moist.     Pharynx: Oropharynx is clear.  Eyes:     General: No scleral icterus.       Right eye: No discharge.        Left eye: No discharge.     Extraocular Movements: Extraocular movements intact.     Pupils: Pupils are equal, round, and reactive to light.   Cardiovascular:     Rate and Rhythm: Normal rate and regular rhythm.     Heart sounds: Normal heart sounds. No murmur heard. No friction rub. No gallop.   Pulmonary:     Effort: Pulmonary effort is normal. No respiratory distress.     Breath sounds: Normal breath sounds. No stridor. No wheezing, rhonchi or rales.  Musculoskeletal:     Cervical back: Normal range of motion.  Neurological:     Mental Status: He is alert and oriented to person, place, and time.  Psychiatric:        Mood and Affect: Mood normal.        Behavior: Behavior normal.        Thought Content: Thought content normal.        Judgment: Judgment normal.      Assessment and Plan :   PDMP not reviewed this encounter.  1. Viral syndrome   2. Encounter for screening for COVID-19     Recommended patient stop the antibiotics that were prescribed as I have low suspicion for diverticulitis and he never had a CT scan to confirm this.  Will manage for viral illness such as viral URI, viral syndrome, viral rhinitis, COVID-19. Counseled patient on nature of COVID-19 including modes of transmission, diagnostic testing, management and supportive care.  Offered scripts for symptomatic relief. COVID 19 testing is pending. Counseled patient on potential for adverse effects with medications prescribed/recommended today, ER and return-to-clinic precautions discussed, patient verbalized understanding.     Jaynee Eagles, [place] 05/17/20 5366

## 2020-05-18 LAB — SARS-COV-2, NAA 2 DAY TAT

## 2020-05-18 LAB — NOVEL CORONAVIRUS, NAA: SARS-CoV-2, NAA: DETECTED — AB

## 2020-05-19 DIAGNOSIS — F331 Major depressive disorder, recurrent, moderate: Secondary | ICD-10-CM | POA: Diagnosis not present

## 2020-05-19 DIAGNOSIS — F419 Anxiety disorder, unspecified: Secondary | ICD-10-CM | POA: Diagnosis not present

## 2020-05-19 DIAGNOSIS — F3181 Bipolar II disorder: Secondary | ICD-10-CM | POA: Diagnosis not present

## 2020-05-24 DIAGNOSIS — F419 Anxiety disorder, unspecified: Secondary | ICD-10-CM | POA: Diagnosis not present

## 2020-05-24 DIAGNOSIS — F331 Major depressive disorder, recurrent, moderate: Secondary | ICD-10-CM | POA: Diagnosis not present

## 2020-05-28 ENCOUNTER — Other Ambulatory Visit: Payer: Self-pay

## 2020-05-28 ENCOUNTER — Encounter: Payer: Self-pay | Admitting: Emergency Medicine

## 2020-05-28 ENCOUNTER — Telehealth (INDEPENDENT_AMBULATORY_CARE_PROVIDER_SITE_OTHER): Payer: BC Managed Care – PPO | Admitting: Emergency Medicine

## 2020-05-28 VITALS — Ht 69.5 in | Wt 219.0 lb

## 2020-05-28 DIAGNOSIS — U071 COVID-19: Secondary | ICD-10-CM

## 2020-05-28 DIAGNOSIS — R112 Nausea with vomiting, unspecified: Secondary | ICD-10-CM | POA: Diagnosis not present

## 2020-05-28 DIAGNOSIS — R059 Cough, unspecified: Secondary | ICD-10-CM | POA: Diagnosis not present

## 2020-05-28 MED ORDER — HYDROCODONE-HOMATROPINE 5-1.5 MG/5ML PO SYRP: 5.0000 mL | ORAL_SOLUTION | Freq: Every evening | ORAL | 0 refills | Status: DC | PRN

## 2020-05-28 MED ORDER — ONDANSETRON 4 MG PO TBDP: 4.0000 mg | ORAL_TABLET | Freq: Three times a day (TID) | ORAL | 1 refills | Status: DC | PRN

## 2020-05-28 NOTE — Progress Notes (Signed)
Telemedicine Encounter- SOAP NOTE Established Patient Patient: Home  Provider: Office     This telephone encounter was conducted with the patient's (or proxy's) verbal consent via audio telecommunications: yes/no: Yes Patient was instructed to have this encounter in a suitably private space; and to only have persons present to whom they give permission to participate. In addition, patient identity was confirmed by use of name plus two identifiers (DOB and address).  I discussed the limitations, risks, security and privacy concerns of performing an evaluation and management service by telephone and the availability of in person appointments. I also discussed with the patient that there may be a patient responsible charge related to this service. The patient expressed understanding and agreed to proceed.  I spent a total of TIME; 0 MIN TO 60 MIN: 20 minutes talking with the patient or their proxy.  Chief Complaint  Patient presents with  . Cough    Per patient after having Covid 05/17/2020 tested positive. Coughing causes vomiting for a week and taking cough medication.    Subjective   Alexander Osborne is a 40 y.o. male established patient. Telephone visit today complaining of vomiting after coughing spells. Recently diagnosed with Covid infection.  No complications.  Slowly getting better but still has persistent cough leading to episodes of vomiting.  Taking Tessalon.  Denies difficulty breathing. No other complaints or medical concerns today.  HPI   Patient Active Problem List   Diagnosis Date Noted  . 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 Food allergy 06/29/2017  . Pruritus 06/29/2017    Past Medical History:  Diagnosis Date  . Allergy   . Asthma   . Diabetes mellitus without complication ([place])     Current Outpatient Medications  Medication Sig Dispense Refill  . albuterol (VENTOLIN HFA) 108 (90 Base) MCG/ACT inhaler Inhale 2 puffs into the lungs every 6 (six) hours  as needed for wheezing or shortness of breath. 18 g 1  . benzonatate (TESSALON) 100 MG capsule Take 1-2 capsules (100-200 mg total) by mouth 3 (three) times daily as needed. 60 capsule 0  . cetirizine (ZYRTEC ALLERGY) 10 MG tablet Take 1 tablet (10 mg total) by mouth daily. 30 tablet 0  . FLUoxetine (PROZAC) 20 MG capsule Take 1 capsule (20 mg total) by mouth daily. 30 capsule 0  . loratadine (CLARITIN) 10 MG tablet Take 10 mg by mouth daily.    . promethazine-dextromethorphan (PROMETHAZINE-DM) 6.25-15 MG/5ML syrup Take 5 mLs by mouth at bedtime as needed for cough. 100 mL 0  . pseudoephedrine (SUDAFED) 60 MG tablet Take 1 tablet (60 mg total) by mouth every 8 (eight) hours as needed for congestion. 30 tablet 0  . glucose blood test strip Check sugar once daily  Dx: DMII controlled, non-insulin, without complications 891 each 12  . glucose monitoring kit (FREESTYLE) monitoring kit 1 each by Does not apply route as needed for other. 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 Lancets MISC Check sugar once daily dx DMII controlled, non-insulin, without complications 694 each 11  . metroNIDAZOLE (FLAGYL) 500 MG tablet Take 1 tablet (500 mg total) by mouth 3 (three) times daily. (Patient not taking: Reported on 05/28/2020) 21 tablet 0   No current facility-administered medications for this visit.    Allergies  Allergen Reactions  . Shellfish Allergy Other (See Comments)    Patient does not remember    Social History   Socioeconomic History  . Marital status: Legally Separated  Spouse name: Not on file  . Number of children: 0  . Years of education: Not on file  . Highest education level: Not on file  Occupational History  . 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 Smoking status: Former Smoker    Packs/day: 0.25    Years: 11.00    Pack years: 2.75    Types: Cigarettes    Quit date: 10/03/2017    Years since quitting: 2.6  .  Smokeless tobacco: Never Used  Vaping Use  . Vaping Use: Never used  Substance and Sexual Activity  . Alcohol use: Yes    Alcohol/week: 1.0 standard drink    Types: 1 Standard drinks or equivalent per week  . Drug use: No  . Sexual activity: Yes    Partners: Male    Birth control/protection: None    Comment: SS partner  Other Topics Concern  . Not on file  Social History Narrative   Marital status: married x 4 years; happily married.       Lives; with husbnad      Employment: Librarian, academic at [employer]: socially; weekends      Alcohol: 1-3 drinks per week      Drugs: none      Exercise: none   Denies caffeine use    Social Determinants of Radio broadcast assistant Strain: Not on file  Food Insecurity: Not on file  Transportation Needs: Not on file  Physical Activity: Not on file  Stress: Not on file  Social Connections: Not on file  Intimate Partner Violence: Not on file    Review of Systems  Constitutional: Negative.  Negative for chills and fever.  HENT: Positive for congestion. Negative for sore throat.   Respiratory: Positive for cough. Negative for shortness of breath.   Cardiovascular: Negative.  Negative for chest pain and palpitations.  Gastrointestinal: Positive for nausea and vomiting. Negative for abdominal pain and diarrhea.  Genitourinary: Negative.   Skin: Negative.  Negative for rash.  Neurological: Negative for dizziness and headaches.  All other systems reviewed and are negative.   Objective  Alert and oriented x3 in no apparent respiratory distress Vitals as reported by the patient: Today's Vitals   05/28/20 1617  Weight: 219 lb (99.3 kg)  Height: 5' 9.5" (1.765 m)    There are no diagnoses linked to this encounter.  Alexander Osborne was seen today for cough.  Diagnoses and all orders for this visit:  Cough -     HYDROcodone-homatropine (HYCODAN) 5-1.5 MG/5ML syrup; Take 5 mLs by mouth at bedtime as needed for  cough.  Intractable vomiting with nausea, unspecified vomiting type -     ondansetron (ZOFRAN ODT) 4 MG disintegrating tablet; Take 1 tablet (4 mg total) by mouth every 8 (eight) hours as needed for nausea or vomiting.  COVID-19 virus infection    Clinically stable.  No red flag signs or symptoms.  ED advice given. Medications as prescribed. Advised to contact the office if no better or worse during the next several days.  I discussed the assessment and treatment plan with the patient. The patient was provided an opportunity to ask questions and all were answered. The patient agreed with the plan and demonstrated an understanding of the instructions.   The patient was advised to call back or seek an in-person evaluation if the symptoms worsen or if the condition fails to improve as anticipated.  I provided  20 minutes of non-face-to-face time during this encounter.  Horald Pollen, MD  Primary Care at [facility]

## 2020-05-28 NOTE — Patient Instructions (Signed)
° ° ° °  If you have lab work done today you will be contacted with your lab results within the next 2 weeks.  If you have not heard from us then please contact us. The fastest way to get your results is to register for My Chart. ° ° °IF you received an x-ray today, you will receive an invoice from [facility]. Please contact [facility] at [phone] with questions or concerns regarding your invoice.  ° °IF you received labwork today, you will receive an invoice from [facility]. Please contact [facility] at [phone] with questions or concerns regarding your invoice.  ° °Our billing staff will not be able to assist you with questions regarding bills from these companies. ° °You will be contacted with the lab results as soon as they are available. The fastest way to get your results is to activate your My Chart account. Instructions are located on the last page of this paperwork. If you have not heard from us regarding the results in 2 weeks, please contact this office. °  ° ° ° °

## 2020-05-31 DIAGNOSIS — F419 Anxiety disorder, unspecified: Secondary | ICD-10-CM | POA: Diagnosis not present

## 2020-05-31 DIAGNOSIS — F3181 Bipolar II disorder: Secondary | ICD-10-CM | POA: Diagnosis not present

## 2020-05-31 DIAGNOSIS — F331 Major depressive disorder, recurrent, moderate: Secondary | ICD-10-CM | POA: Diagnosis not present

## 2020-06-04 DIAGNOSIS — F331 Major depressive disorder, recurrent, moderate: Secondary | ICD-10-CM | POA: Diagnosis not present

## 2020-06-04 DIAGNOSIS — F419 Anxiety disorder, unspecified: Secondary | ICD-10-CM | POA: Diagnosis not present

## 2020-06-07 DIAGNOSIS — F331 Major depressive disorder, recurrent, moderate: Secondary | ICD-10-CM | POA: Diagnosis not present

## 2020-06-07 DIAGNOSIS — F419 Anxiety disorder, unspecified: Secondary | ICD-10-CM | POA: Diagnosis not present

## 2020-06-07 DIAGNOSIS — F3181 Bipolar II disorder: Secondary | ICD-10-CM | POA: Diagnosis not present

## 2020-06-16 DIAGNOSIS — F419 Anxiety disorder, unspecified: Secondary | ICD-10-CM | POA: Diagnosis not present

## 2020-06-16 DIAGNOSIS — F3181 Bipolar II disorder: Secondary | ICD-10-CM | POA: Diagnosis not present

## 2020-06-16 DIAGNOSIS — F331 Major depressive disorder, recurrent, moderate: Secondary | ICD-10-CM | POA: Diagnosis not present

## 2020-09-25 ENCOUNTER — Ambulatory Visit: Payer: Self-pay

## 2020-10-01 ENCOUNTER — Ambulatory Visit (HOSPITAL_BASED_OUTPATIENT_CLINIC_OR_DEPARTMENT_OTHER): Payer: Managed Care, Other (non HMO) | Admitting: Family Medicine

## 2020-10-03 ENCOUNTER — Ambulatory Visit: Payer: Self-pay | Admitting: Family Medicine

## 2020-10-08 ENCOUNTER — Ambulatory Visit
Admission: EM | Admit: 2020-10-08 | Discharge: 2020-10-08 | Disposition: A | Discharge status: Home / Self Care | Payer: Managed Care, Other (non HMO) | Attending: Emergency Medicine | Admitting: Emergency Medicine

## 2020-10-08 ENCOUNTER — Other Ambulatory Visit: Payer: Self-pay

## 2020-10-08 DIAGNOSIS — G5603 Carpal tunnel syndrome, bilateral upper limbs: Secondary | ICD-10-CM

## 2020-10-08 DIAGNOSIS — R1319 Other dysphagia: Secondary | ICD-10-CM | POA: Insufficient documentation

## 2020-10-08 DIAGNOSIS — J069 Acute upper respiratory infection, unspecified: Secondary | ICD-10-CM | POA: Insufficient documentation

## 2020-10-08 LAB — POCT RAPID STREP A (OFFICE): Rapid Strep A Screen: NEGATIVE

## 2020-10-08 MED ORDER — IBUPROFEN 800 MG PO TABS: 800.0000 mg | ORAL_TABLET | Freq: Three times a day (TID) | ORAL | 0 refills | Status: DC

## 2020-10-08 MED ORDER — BENZONATATE 200 MG PO CAPS: 200.0000 mg | ORAL_CAPSULE | Freq: Three times a day (TID) | ORAL | 0 refills | Status: AC | PRN

## 2020-10-08 NOTE — Discharge Instructions (Addendum)
Strep negative Continue Xyzal, Mucinex Tessalon every 8 hours for cough Tylenol and ibuprofen for throat pain, headaches, hand pain/tingling Rest and fluids Follow-up with ENT if continuing to have discomfort in throat for further evaluation

## 2020-10-08 NOTE — ED Triage Notes (Signed)
Onset this morning of cough, HA and pain at the roof of his mouth. Has taken Mucinex and Tylenol without relief. Emesis x1. Negative at home COVID test taken today.

## 2020-10-08 NOTE — ED Provider Notes (Signed)
[facility]    CSN: [id number] Arrival date & time: 10/08/20  1202      History   Chief Complaint Chief Complaint  Patient presents with   Cough   Headache    HPI Alexander Osborne is a 40 y.o. male history of asthma, DM type II, presenting today for evaluation of URI symptoms.  Reports that he has developed sore throat, postnasal drainage, cough x1 day.  Denies fevers chills or body aches.  At home COVID test negative.  Did have 1 episode of vomiting from drainage.  Denies any abdominal pain.  Reports symptoms recurrent every few weeks since March.  Reports since then he has also had a sensation in his esophagus which is constant, feels as if there is an "growth" or sensation of something stuck in there.  On daily Xyzal.  Started Mucinex today.  Reports paresthesias into first through third fingers bilaterally, worse on left.  Symptoms times months.  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 Authorizing Provider  benzonatate (TESSALON) 200 MG capsule Take 1 capsule (200 mg total) by mouth 3 (three) times daily as needed for up to 7 days for cough. 10/08/20 10/15/20 Yes Manuelito Poage C, PA-C  ibuprofen (ADVIL) 800 MG tablet Take 1 tablet (800 mg total) by mouth 3 (three) times daily. 10/08/20  Yes Dimarco Minkin C, PA-C  albuterol (VENTOLIN HFA) 108 (90 Base) MCG/ACT inhaler Inhale 2 puffs into the lungs every 6 (six) hours as needed for wheezing or shortness of breath. 09/26/19   Bobbitt, Sedalia Muta, MD  cetirizine (ZYRTEC ALLERGY) 10 MG tablet Take  1 tablet (10 mg total) by mouth daily. 05/17/20   Jaynee Eagles, PA-C  FLUoxetine (PROZAC) 20 MG capsule Take 1 capsule (20 mg total) by mouth daily. 03/07/20   Merian Capron, MD  glucose blood test strip Check sugar once daily  Dx: DMII controlled, non-insulin, without complications 9/62/83   Wardell Honour, MD  glucose monitoring kit (FREESTYLE) monitoring kit 1 each by Does not apply route as needed for other. Dx: DMII non-insulin dependent without complications; controlled; 05/04/17   Wardell Honour, MD  Lancets MISC Check sugar once daily dx DMII controlled, non-insulin, without complications 6/62/94   Wardell Honour, MD  loratadine (CLARITIN) 10 MG tablet Take 10 mg by mouth daily.    [provider]  metroNIDAZOLE (FLAGYL) 500 MG tablet Take 1 tablet (500 mg total) by mouth 3 (three) times daily. Patient not taking: Reported on 05/28/2020 05/15/20   Just, Laurita Quint, FNP    Family History Family History  Problem Relation Age of Onset   Cancer Mother 66       pancreatic cancer   Hypertension Father    Heart disease Father 70       CAD   Bipolar disorder Brother    Diabetes Sister    Allergic rhinitis Neg Hx    Angioedema Neg Hx    Asthma Neg Hx  Eczema Neg Hx    Immunodeficiency Neg Hx    Urticaria Neg Hx     Social History Social History   Tobacco Use   Smoking status: Former    Packs/day: 0.25    Years: 11.00    Pack years: 2.75    Types: Cigarettes    Quit date: 10/03/2017    Years since quitting: 3.0   Smokeless tobacco: Never  Vaping Use   Vaping Use: Never used  Substance Use Topics   Alcohol use: Yes    Alcohol/week: 1.0 standard drink    Types: 1 Standard drinks or equivalent per week   Drug use: No     Allergies   Shellfish allergy   Review of Systems Review of Systems  Constitutional:  Negative for activity change, appetite change, chills, fatigue and fever.  HENT:  Positive for congestion, rhinorrhea and sore throat. Negative for ear  pain, sinus pressure and trouble swallowing.   Eyes:  Negative for discharge and redness.  Respiratory:  Positive for cough. Negative for chest tightness and shortness of breath.   Cardiovascular:  Negative for chest pain.  Gastrointestinal:  Positive for vomiting. Negative for abdominal pain, diarrhea and nausea.  Musculoskeletal:  Negative for myalgias.  Skin:  Negative for rash.  Neurological:  Positive for dizziness and headaches. Negative for light-headedness.    Physical Exam Triage Vital Signs ED Triage Vitals  Enc Vitals Group     BP 10/08/20 1252 130/85     Pulse Rate 10/08/20 1252 76     Resp 10/08/20 1252 18     Temp 10/08/20 1252 98 F (36.7 C)     Temp Source 10/08/20 1252 Oral     SpO2 10/08/20 1252 96 %     Weight --      Height --      Head Circumference --      Peak Flow --      Pain Score 10/08/20 1256 3     Pain Loc --      Pain Edu? --      Excl. in [place]? --    No data found.  Updated Vital Signs BP 130/85 (BP Location: Left Arm)   Pulse 76   Temp 98 F (36.7 C) (Oral)   Resp 18   SpO2 96%   Visual Acuity Right Eye Distance:   Left Eye Distance:   Bilateral Distance:    Right Eye Near:   Left Eye Near:    Bilateral Near:     Physical Exam Vitals and nursing note reviewed.  Constitutional:      Appearance: He is well-developed.     Comments: No acute distress  HENT:     Head: Normocephalic and atraumatic.     Ears:     Comments: Bilateral ears without tenderness to palpation of external auricle, tragus and mastoid, EAC's without erythema or swelling, TM's with good bony landmarks and cone of light. Non erythematous.      Nose: Nose normal.     Mouth/Throat:     Comments: Oral mucosa pink and moist, no tonsillar enlargement or exudate. Posterior pharynx patent and nonerythematous, no uvula deviation or swelling. Normal phonation.  Eyes:     Conjunctiva/sclera: Conjunctivae normal.  Cardiovascular:     Rate and Rhythm: Normal rate.   Pulmonary:     Effort: Pulmonary effort is normal. No respiratory distress.     Comments: Breathing comfortably at rest, CTABL, no wheezing, rales or other adventitious sounds auscultated  Abdominal:     General: There is no distension.  Musculoskeletal:        General: Normal range of motion.     Cervical back: Neck supple.  Skin:    General: Skin is warm and dry.  Neurological:     Mental Status: He is alert and oriented to person, place, and time.     UC Treatments / Results  Labs (all labs ordered are listed, but only abnormal results are displayed) Labs Reviewed  CULTURE, GROUP A STREP [facility] [address])  POCT RAPID STREP A (OFFICE)    EKG   Radiology No results found.  Procedures Procedures (including critical care time)  Medications Ordered in UC Medications - No data to display  Initial Impression / Assessment and Plan / UC Course  I have reviewed the triage vital signs and the nursing notes.  Pertinent labs & imaging results that were available during my care of the patient were reviewed by me and considered in my medical decision making (see chart for details).     Viral URI with cough-strep negative, at home COVID negative, recommending symptomatic and supportive care as symptoms x1 day.  Recommendations provided, encouraged follow-up with ENT if continuing to have symptoms of some thing stuck in throat/dysphagia. Hand paresthesias-symptom suggestive of carpal tunnel given distribution, recommend anti-inflammatories, avoiding triggering motions, follow-up with sports medicine if persistent  Discussed strict return precautions. Patient verbalized understanding and is agreeable with plan.  Final Clinical Impressions(s) / UC Diagnoses   Final diagnoses:  Viral URI with cough  Bilateral carpal tunnel syndrome  Esophageal dysphagia     Discharge Instructions      Strep negative Continue Xyzal, Mucinex Tessalon every 8 hours for cough Tylenol and ibuprofen  for throat pain, headaches, hand pain/tingling Rest and fluids Follow-up with ENT if continuing to have discomfort in throat for further evaluation      ED Prescriptions     Medication Sig Dispense Auth. Provider   benzonatate (TESSALON) 200 MG capsule Take 1 capsule (200 mg total) by mouth 3 (three) times daily as needed for up to 7 days for cough. 28 capsule Abrial Arrighi C, PA-C   ibuprofen (ADVIL) 800 MG tablet Take 1 tablet (800 mg total) by mouth 3 (three) times daily. 21 tablet Eh Sauseda, [place] C, PA-C      PDMP not reviewed this encounter.   Janith Lima, PA-C 10/08/20 1357

## 2020-10-10 ENCOUNTER — Other Ambulatory Visit: Payer: Self-pay

## 2020-10-10 ENCOUNTER — Emergency Department (HOSPITAL_COMMUNITY)
Admission: EM | Admit: 2020-10-10 | Discharge: 2020-10-10 | Disposition: A | Discharge status: Home / Self Care | Payer: Managed Care, Other (non HMO) | Attending: Emergency Medicine | Admitting: Emergency Medicine

## 2020-10-10 ENCOUNTER — Encounter (HOSPITAL_COMMUNITY): Payer: Self-pay | Admitting: *Deleted

## 2020-10-10 DIAGNOSIS — Z20822 Contact with and (suspected) exposure to covid-19: Secondary | ICD-10-CM | POA: Insufficient documentation

## 2020-10-10 DIAGNOSIS — Z87891 Personal history of nicotine dependence: Secondary | ICD-10-CM | POA: Insufficient documentation

## 2020-10-10 DIAGNOSIS — E119 Type 2 diabetes mellitus without complications: Secondary | ICD-10-CM | POA: Insufficient documentation

## 2020-10-10 DIAGNOSIS — J039 Acute tonsillitis, unspecified: Secondary | ICD-10-CM | POA: Diagnosis not present

## 2020-10-10 DIAGNOSIS — J45909 Unspecified asthma, uncomplicated: Secondary | ICD-10-CM | POA: Insufficient documentation

## 2020-10-10 DIAGNOSIS — R0981 Nasal congestion: Secondary | ICD-10-CM | POA: Diagnosis present

## 2020-10-10 LAB — CULTURE, GROUP A STREP (THRC)

## 2020-10-10 LAB — SARS CORONAVIRUS 2 (TAT 6-24 HRS): SARS Coronavirus 2: NEGATIVE

## 2020-10-10 MED ORDER — DEXAMETHASONE SODIUM PHOSPHATE 10 MG/ML IJ SOLN
10.0000 mg | Freq: Once | INTRAMUSCULAR | Status: AC
  Administered 2020-10-10: 10 mg via INTRAMUSCULAR
  Filled 2020-10-10: qty 1

## 2020-10-10 MED ORDER — PENICILLIN G BENZATHINE 1200000 UNIT/2ML IM SUSY
1.2000 10*6.[IU] | PREFILLED_SYRINGE | Freq: Once | INTRAMUSCULAR | Status: AC
  Administered 2020-10-10: 1.2 10*6.[IU] via INTRAMUSCULAR
  Filled 2020-10-10: qty 2

## 2020-10-10 NOTE — ED Triage Notes (Signed)
Pt complains of pain and swelling in throat. He was seen at [facility], tested negative for COVID. Swelling and pain became worse this morning. ENT appointment next week.

## 2020-10-10 NOTE — Discharge Instructions (Signed)
Please read and follow all provided instructions.  Your diagnoses today include:  1. Tonsillitis     Tests performed today include: Covid test - pending Vital signs. See below for your results today.   Medications prescribed:  You were given a one-time shot of penicillin to treat your strep throat.   You were also given a dose of steroid to help with the pain and swelling in your throat.  Take any medications prescribed only as directed.   Home care instructions:  Please read the educational materials provided and follow any instructions contained in this packet.  Follow-up instructions: Please follow-up with your primary care provider as needed for further evaluation of your symptoms.  Return instructions:  Please return to the Emergency Department if you experience worsening symptoms.  Return if you have worsening problems swallowing, your neck becomes swollen, you cannot swallow your saliva or your voice becomes muffled.  Return with high persistent fever, persistent vomiting, or if you have trouble breathing.  Please return if you have any other emergent concerns.  Additional Information:  Your vital signs today were: BP (!) 151/100 (BP Location: Right Arm)   Pulse 76   Temp 98.1 F (36.7 C) (Oral)   Resp 16   SpO2 98%  If your blood pressure (BP) was elevated above 135/85 this visit, please have this repeated by your doctor within one month. --------------

## 2020-10-10 NOTE — ED Provider Notes (Signed)
[place] DEPT Provider Note   CSN: [id number] Arrival date & time: 10/10/20  0272     History Chief Complaint  Patient presents with   Sore Throat   Oral Swelling    Alexander Osborne is a 40 y.o. male.  Patient with history of type 2 diabetes presents the emergency department for ongoing sore throat.  Patient was seen at urgent care 2 days ago.  He had associated congestion and cough.  He has had chills and subjective fever.  He states that the sore throat has continued to persist.  He felt like the back of his throat was swelling, including his uvula.  He has been able to swallow but with pain.  No breathing difficulties.  No symptoms of an allergic reaction.  He states that he took a home antigen COVID test which was negative.  He was tested for strep and was reported negative.  He has been taking ibuprofen for pain and swelling.  No voice changes.  No difficulty with mobility of neck.  No vomiting, chest pain, shortness of breath, abdominal pain.  Patient states that he has had similar symptoms every several weeks over the past couple of months.  He has had some pain in his throat since a vomiting spell several months ago.  He is due to follow-up with an ENT doctor in 8 days for evaluation.      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   Food allergy 06/29/2017   Pruritus 06/29/2017    Past Surgical History:  Procedure Laterality Date   corneal transplant left     EYE SURGERY         Family History  Problem Relation Age of Onset   Cancer Mother 84       pancreatic cancer   Hypertension Father    Heart disease Father 71       CAD   Bipolar disorder Brother    Diabetes Sister    Allergic rhinitis Neg Hx    Angioedema Neg Hx    Asthma Neg Hx    Eczema Neg Hx     Immunodeficiency Neg Hx    Urticaria Neg Hx     Social History   Tobacco Use   Smoking status: Former    Packs/day: 0.25    Years: 11.00    Pack years: 2.75    Types: Cigarettes    Quit date: 10/03/2017    Years since quitting: 3.0   Smokeless tobacco: Never  Vaping Use   Vaping Use: Never used  Substance Use Topics   Alcohol use: Yes    Alcohol/week: 1.0 standard drink    Types: 1 Standard drinks or equivalent per week   Drug use: No    Home Medications Prior to Admission medications   Medication Sig Start Date End Date Taking? Authorizing Provider  albuterol (VENTOLIN HFA) 108 (90 Base) MCG/ACT inhaler Inhale 2 puffs into the lungs every 6 (six) hours as needed for wheezing or shortness of breath. 09/26/19   Bobbitt, [place] Muta, MD  benzonatate (TESSALON) 200 MG capsule Take 1 capsule (200 mg total) by mouth 3 (three) times daily as needed for up to 7 days for cough. 10/08/20 10/15/20  Wieters, Hallie C, PA-C  cetirizine (ZYRTEC ALLERGY) 10 MG tablet Take 1  tablet (10 mg total) by mouth daily. 05/17/20   Jaynee Eagles, PA-C  FLUoxetine (PROZAC) 20 MG capsule Take 1 capsule (20 mg total) by mouth daily. 03/07/20   Merian Capron, MD  glucose blood test strip Check sugar once daily  Dx: DMII controlled, non-insulin, without complications 2/68/34   Wardell Honour, MD  glucose monitoring kit (FREESTYLE) monitoring kit 1 each by Does not apply route as needed for other. Dx: DMII non-insulin dependent without complications; controlled; 05/04/17   Wardell Honour, MD  ibuprofen (ADVIL) 800 MG tablet Take 1 tablet (800 mg total) by mouth 3 (three) times daily. 10/08/20   Wieters, Hallie C, PA-C  Lancets MISC Check sugar once daily dx DMII controlled, non-insulin, without complications 1/96/22   Wardell Honour, MD  loratadine (CLARITIN) 10 MG tablet Take 10 mg by mouth daily.    [provider]  metroNIDAZOLE (FLAGYL) 500 MG tablet Take 1 tablet (500 mg total) by mouth 3 (three) times  daily. Patient not taking: Reported on 05/28/2020 05/15/20   Just, Laurita Quint, FNP    Allergies    Shellfish allergy  Review of Systems   Review of Systems  Constitutional:  Positive for chills. Negative for fatigue and fever (subjective).  HENT:  Positive for congestion and sore throat. Negative for ear pain, rhinorrhea, sinus pressure, trouble swallowing and voice change.   Eyes:  Negative for redness.  Respiratory:  Positive for cough (occasional). Negative for wheezing.   Gastrointestinal:  Negative for abdominal pain, diarrhea, nausea and vomiting.  Genitourinary:  Negative for dysuria.  Musculoskeletal:  Negative for myalgias and neck stiffness.  Skin:  Negative for rash.  Neurological:  Negative for headaches.  Hematological:  Negative for adenopathy.   Physical Exam Updated Vital Signs BP (!) 151/100 (BP Location: Right Arm)   Pulse 76   Temp 98.1 F (36.7 C) (Oral)   Resp 16   SpO2 98%   Physical Exam Vitals and nursing note reviewed.  Constitutional:      Appearance: He is well-developed.  HENT:     Head: Normocephalic and atraumatic.     Jaw: No trismus.     Right Ear: Tympanic membrane, ear canal and external ear normal.     Left Ear: Tympanic membrane, ear canal and external ear normal.     Nose: Nose normal. No mucosal edema or rhinorrhea.     Mouth/Throat:     Mouth: Mucous membranes are moist. Mucous membranes are not dry.     Pharynx: Uvula midline. Posterior oropharyngeal erythema present. No oropharyngeal exudate or uvula swelling.     Tonsils: Tonsillar abscess present. 3+ on the right. 3+ on the left.     Comments: No voice change. Eyes:     General:        Right eye: No discharge.        Left eye: No discharge.     Conjunctiva/sclera: Conjunctivae normal.  Neck:     Comments: Full range of motion of neck without any difficulty or pain. Cardiovascular:     Rate and Rhythm: Normal rate and regular rhythm.     Heart sounds: Normal heart sounds.   Pulmonary:     Effort: Pulmonary effort is normal. No respiratory distress.     Breath sounds: Normal breath sounds. No wheezing or rales.  Abdominal:     Palpations: Abdomen is soft.     Tenderness: There is no abdominal tenderness.  Musculoskeletal:     Cervical back: Normal  range of motion and neck supple.  Lymphadenopathy:     Cervical: No cervical adenopathy.  Skin:    General: Skin is warm and dry.  Neurological:     Mental Status: He is alert.    ED Results / Procedures / Treatments   Labs (all labs ordered are listed, but only abnormal results are displayed) Labs Reviewed  SARS CORONAVIRUS 2 (TAT 6-24 HRS)    EKG None  Radiology No results found.  Procedures Procedures   Medications Ordered in ED Medications  dexamethasone (DECADRON) injection 10 mg (10 mg Intramuscular Given 10/10/20 0802)  penicillin g benzathine (BICILLIN LA) 1200000 UNIT/2ML injection 1.2 Million Units (1.2 Million Units Intramuscular Given 10/10/20 0802)    ED Course  I have reviewed the triage vital signs and the nursing notes.  Pertinent labs & imaging results that were available during my care of the patient were reviewed by me and considered in my medical decision making (see chart for details).  Patient seen and examined.  I do not see any signs of airway obstruction.  He does not have stridor.  No trismus or voice change.  He has full range of motion of the neck without any difficulty.  His tonsils are enlarged and mildly erythematous without exudate.  Discussed symptomatic care with patient.  Plan: IM Decadron, IM Bicillin, PCR COVID test to confirm.  Vital signs reviewed and are as follows: BP (!) 151/100 (BP Location: Right Arm)   Pulse 76   Temp 98.1 F (36.7 C) (Oral)   Resp 16   SpO2 98%   Patient encouraged to return to the emergency department if he is unable to swallow, has any difficulty breathing, high high fever, persistent vomiting, or other concerns.  ENT  follow-up in 8 days will likely be helpful given recent recurrent symptoms.     MDM Rules/Calculators/A&P                          Patient with clinical tonsillitis.  He is a diabetic but does not have any symptoms consistent with DKA.  No tachycardia.  His voice is clear and he has full range of motion of his neck.  No trismus or voice changes.  I have low concern for deep space abscess or Ludwick's angina.  No clinical evidence of necrotizing infection.  He looks very well, mildly anxious.  Feel benefit of IM Decadron outweighs the risk at this point due to his bothersome sore throat symptoms and no signs of decompensated diabetes.  Return instructions as above.   Final Clinical Impression(s) / ED Diagnoses Final diagnoses:  Tonsillitis    Rx / DC Orders ED Discharge Orders     None        Carlisle Cater, Hershal Coria 10/10/20 7169    Valarie Merino, MD 10/10/20 [phone]

## 2020-10-18 ENCOUNTER — Ambulatory Visit (INDEPENDENT_AMBULATORY_CARE_PROVIDER_SITE_OTHER): Payer: Managed Care, Other (non HMO) | Admitting: Family Medicine

## 2020-10-18 ENCOUNTER — Other Ambulatory Visit: Payer: Self-pay

## 2020-10-18 ENCOUNTER — Encounter: Payer: Self-pay | Admitting: Family Medicine

## 2020-10-18 VITALS — BP 120/79 | HR 76 | Ht 69.5 in | Wt 227.0 lb

## 2020-10-18 DIAGNOSIS — G5603 Carpal tunnel syndrome, bilateral upper limbs: Secondary | ICD-10-CM

## 2020-10-18 DIAGNOSIS — Z8639 Personal history of other endocrine, nutritional and metabolic disease: Secondary | ICD-10-CM | POA: Diagnosis not present

## 2020-10-18 DIAGNOSIS — Z113 Encounter for screening for infections with a predominantly sexual mode of transmission: Secondary | ICD-10-CM | POA: Diagnosis not present

## 2020-10-18 DIAGNOSIS — K219 Gastro-esophageal reflux disease without esophagitis: Secondary | ICD-10-CM | POA: Insufficient documentation

## 2020-10-18 NOTE — Patient Instructions (Signed)
Carpal Tunnel Syndrome Carpal tunnel syndrome is a condition that causes pain, weakness, and numbness in your hand and arm. Numbness is when you cannot feel an area in your body. The carpal tunnel is a narrow area that is on the palm side of your wrist. Repeated wrist motion or certain diseases may cause swelling in the tunnel. This swelling can pinch the main nerve in the wrist. This nerve is called the median nerve. What are the causes? This condition may be caused by: Moving your hand and wrist over and over again while doing a task. Injury to the wrist. Arthritis. A sac of fluid (cyst) or abnormal growth (tumor) in the carpal tunnel. Fluid buildup during pregnancy. Use of tools that vibrate. Sometimes the cause is not known. What increases the risk? The following factors may make you more likely to have this condition: Having a job that makes you do these things: Move your hand over and over again. Work with tools that vibrate, such as drills or sanders. Being a woman. Having diabetes, obesity, thyroid problems, or kidney failure. What are the signs or symptoms? Symptoms of this condition include: A tingling feeling in your fingers. Tingling or loss of feeling in your hand. Pain in your entire arm. This pain may get worse when you bend your wrist and elbow for a long time. Pain in your wrist that goes up your arm to your shoulder. Pain that goes down into your palm or fingers. Weakness in your hands. You may find it hard to grab and hold items. You may feel worse at night. How is this treated? This condition may be treated with: Lifestyle changes. You will be asked to stop or change the activity that caused your problem. Doing exercises and activities that make bones, muscles, and tendons stronger (physical therapy). Learning how to use your hand again (occupational therapy). Medicines for pain and swelling. You may have injections in your wrist. A wrist splint or  brace. Surgery. Follow these instructions at home: If you have a splint or brace: Wear the splint or brace as told by your doctor. Take it off only as told by your doctor. Loosen the splint if your fingers: Tingle. Become numb. Turn cold and blue. Keep the splint or brace clean. If the splint or brace is not waterproof: Do not let it get wet. Cover it with a watertight covering when you take a bath or a shower. Managing pain, stiffness, and swelling If told, put ice on the painful area: If you have a removable splint or brace, remove it as told by your doctor. Put ice in a plastic bag. Place a towel between your skin and the bag. Leave the ice on for 20 minutes, 2-3 times per day. Do not fall asleep with the cold pack on your skin. Take off the ice if your skin turns bright red. This is very important. If you cannot feel pain, heat, or cold, you have a greater risk of damage to the area. Move your fingers often to reduce stiffness and swelling. General instructions Take over-the-counter and prescription medicines only as told by your doctor. Rest your wrist from any activity that may cause pain. If needed, talk with your boss at work about changes that can help your wrist heal. Do exercises as told by your doctor, physical therapist, or occupational therapist. Keep all follow-up visits. Contact a doctor if: You have new symptoms. Medicine does not help your pain. Your symptoms get worse. Get help right away   if: You have very bad numbness or tingling in your wrist or hand. Summary Carpal tunnel syndrome is a condition that causes pain in your hand and arm. It is often caused by repeated wrist motions. Lifestyle changes and medicines are used to treat this problem. Surgery may help in very bad cases. Follow your doctor's instructions about wearing a splint, resting your wrist, keeping follow-up visits, and calling for help. This information is not intended to replace advice given  to you by your health care provider. Make sure you discuss any questions you have with your health care provider. Document Revised: 08/18/2019 Document Reviewed: 08/18/2019 Elsevier Patient Education  2022 [employer]  

## 2020-10-21 ENCOUNTER — Encounter: Payer: Self-pay | Admitting: Family Medicine

## 2020-10-21 DIAGNOSIS — G5603 Carpal tunnel syndrome, bilateral upper limbs: Secondary | ICD-10-CM | POA: Insufficient documentation

## 2020-10-21 DIAGNOSIS — Z113 Encounter for screening for infections with a predominantly sexual mode of transmission: Secondary | ICD-10-CM | POA: Insufficient documentation

## 2020-10-21 DIAGNOSIS — Z8639 Personal history of other endocrine, nutritional and metabolic disease: Secondary | ICD-10-CM | POA: Insufficient documentation

## 2020-10-21 NOTE — Progress Notes (Signed)
Alexander Osborne - 40 y.o. male MRN [id number]  Date of birth: 09/24/1980  Subjective No chief complaint on file.   HPI Alexander Osborne is a 40 year old male here today for initial visit to establish care.  He has a prior history of type 2 diabetes that has been well controlled with dietary change and weight loss.  He has complaint of bilateral hand numbness and tingling.  He would also like STI screening.  He has had numbness and tingling in his hands for a few weeks.  This seems to be worse in the evenings and when sitting.  Seems to improve when he is standing.  This affects the first 2 digits as well as part of the third digit of bilateral hands.  He has noticed some slightly diminished grip strength.  He denies any pain in the neck or elbow area.  He does have a prior history of diabetes but this has been well controlled with recent readings in the prediabetes range or lower.  He would like to have screening for STI.  He does use condoms majority of the time however has had a couple of encounters where he did not.  He denies any symptoms at this time.  ROS:  A comprehensive ROS was completed and negative except as noted per HPI  Allergies  Allergen Reactions   Shellfish Allergy Other (See Comments)    Patient does not remember    Past Medical History:  Diagnosis Date   Allergy    Anxiety    Asthma    Depression    Diabetes mellitus without complication (HCC)     Past Surgical History:  Procedure Laterality Date   corneal transplant left     EYE SURGERY      Social History   Socioeconomic History   Marital status: Legally Separated    Spouse name: Not on file   Number of children: 0   Years of education: Not on file   Highest education level: Not on file  Occupational History   Occupation: Production designer, theatre/television/film    Employer: [employer]  Tobacco Use   Smoking status: Former    Packs/day: 0.25    Years: 11.00    Pack years: 2.75    Types: Cigarettes    Quit date: 10/03/2017     Years since quitting: 3.0   Smokeless tobacco: Never  Vaping Use   Vaping Use: Never used  Substance and Sexual Activity   Alcohol use: Yes    Alcohol/week: 1.0 standard drink    Types: 1 Standard drinks or equivalent per week   Drug use: No   Sexual activity: Yes    Partners: Male    Birth control/protection: None    Comment: SS partner  Other Topics Concern   Not on file  Social History Narrative   Marital status: married x 4 years; happily married.       Lives; with husbnad      Employment: Building control surveyor at [employer]      Tobacco: socially; weekends      Alcohol: 1-3 drinks per week      Drugs: none      Exercise: none   Denies caffeine use    Social Determinants of Corporate investment banker Strain: Not on file  Food Insecurity: Not on file  Transportation Needs: Not on file  Physical Activity: Not on file  Stress: Not on file  Social Connections: Not on file    Family History  Problem Relation  Age of Onset   Cancer Mother 65       pancreatic cancer   Hypertension Father    Heart disease Father 28       CAD   Bipolar disorder Brother    Diabetes Sister    Allergic rhinitis Neg Hx    Angioedema Neg Hx    Asthma Neg Hx    Eczema Neg Hx    Immunodeficiency Neg Hx    Urticaria Neg Hx     Health Maintenance  Topic Date Due   Pneumococcal Vaccine 7-82 Years old (1 - PCV) Never done   Hepatitis C Screening  Never done   OPHTHALMOLOGY EXAM  06/10/2018   FOOT EXAM  08/11/2018   URINE MICROALBUMIN  11/14/2018   COVID-19 Vaccine (4 - Booster for Moderna series) 05/15/2020   INFLUENZA VACCINE  11/19/2020   HEMOGLOBIN A1C  04/19/2021   TETANUS/TDAP  04/30/2027   PNEUMOCOCCAL POLYSACCHARIDE VACCINE AGE 82-64 HIGH RISK  Completed   HIV Screening  Completed   HPV VACCINES  Aged Out      ----------------------------------------------------------------------------------------------------------------------------------------------------------------------------------------------------------------- Physical Exam BP 120/79   Pulse 76   Ht 5' 9.5" (1.765 m)   Wt 227 lb (103 kg)   BMI 33.04 kg/m   Physical Exam Constitutional:      Appearance: Normal appearance.  HENT:     Head: Normocephalic and atraumatic.  Eyes:     General: No scleral icterus. Cardiovascular:     Rate and Rhythm: Normal rate and regular rhythm.  Pulmonary:     Effort: Pulmonary effort is normal.     Breath sounds: Normal breath sounds.  Musculoskeletal:     Cervical back: Neck supple.  Neurological:     Mental Status: He is alert.     Comments: Bilateral Tinel sign positive at carpal tunnel. Increased symptoms with Phalen test.  Grip strength is normal.  Psychiatric:        Mood and Affect: Mood normal.        Behavior: Behavior normal.    ------------------------------------------------------------------------------------------------------------------------------------------------------------------------------------------------------------------- Assessment and Plan  Bilateral carpal tunnel syndrome His symptoms are consistent with carpal tunnel syndrome.  I will place referral to neurology for nerve conduction studies.  We discussed conservative measures including applying ice, wrist braces that are available as well as home stretches.  He may also try anti-inflammatories as needed.  Routine screening for STI (sexually transmitted infection) STI screening per orders.  History of type 2 diabetes mellitus Updating blood sugar, renal function and A1c today.   No orders of the defined types were placed in this encounter.   No follow-ups on file.    This visit occurred during the SARS-CoV-2 public health emergency.  Safety protocols were in place, including screening questions  prior to the visit, additional usage of staff PPE, and extensive cleaning of exam room while observing appropriate contact time as indicated for disinfecting solutions.

## 2020-10-21 NOTE — Assessment & Plan Note (Signed)
Updating blood sugar, renal function and A1c today.

## 2020-10-21 NOTE — Assessment & Plan Note (Signed)
STI screening per orders.

## 2020-10-21 NOTE — Assessment & Plan Note (Signed)
His symptoms are consistent with carpal tunnel syndrome.  I will place referral to neurology for nerve conduction studies.  We discussed conservative measures including applying ice, wrist braces that are available as well as home stretches.  He may also try anti-inflammatories as needed.

## 2020-10-22 LAB — COMPLETE METABOLIC PANEL WITH GFR
AG Ratio: 1.5 (calc) (ref 1.0–2.5)
ALT: 28 U/L (ref 9–46)
AST: 22 U/L (ref 10–40)
Albumin: 4.5 g/dL (ref 3.6–5.1)
Alkaline phosphatase (APISO): 58 U/L (ref 36–130)
BUN: 20 mg/dL (ref 7–25)
CO2: 26 mmol/L (ref 20–32)
Calcium: 9.5 mg/dL (ref 8.6–10.3)
Chloride: 106 mmol/L (ref 98–110)
Creat: 0.99 mg/dL (ref 0.60–1.35)
GFR, Est African American: 112 mL/min/{1.73_m2} (ref >=60)
GFR, Est Non African American: 97 mL/min/{1.73_m2} (ref >=60)
Globulin: 3 g/dL (calc) (ref 1.9–3.7)
Glucose, Bld: 101 mg/dL — ABNORMAL HIGH (ref 65–99)
Potassium: 4.6 mmol/L (ref 3.5–5.3)
Sodium: 139 mmol/L (ref 135–146)
Total Bilirubin: 0.5 mg/dL (ref 0.2–1.2)
Total Protein: 7.5 g/dL (ref 6.1–8.1)

## 2020-10-22 LAB — CBC WITH DIFFERENTIAL/PLATELET
Absolute Monocytes: 449 cells/uL (ref 200–950)
Basophils Absolute: 67 cells/uL (ref 0–200)
Basophils Relative: 1 %
Eosinophils Absolute: 188 cells/uL (ref 15–500)
Eosinophils Relative: 2.8 %
HCT: 45 % (ref 38.5–50.0)
Hemoglobin: 15.3 g/dL (ref 13.2–17.1)
Lymphs Abs: 2667 cells/uL (ref 850–3900)
MCH: 29.9 pg (ref 27.0–33.0)
MCHC: 34 g/dL (ref 32.0–36.0)
MCV: 88.1 fL (ref 80.0–100.0)
MPV: 10.9 fL (ref 7.5–12.5)
Monocytes Relative: 6.7 %
Neutro Abs: 3330 cells/uL (ref 1500–7800)
Neutrophils Relative %: 49.7 %
Platelets: 262 10*3/uL (ref 140–400)
RBC: 5.11 10*6/uL (ref 4.20–5.80)
RDW: 12.9 % (ref 11.0–15.0)
Total Lymphocyte: 39.8 %
WBC: 6.7 10*3/uL (ref 3.8–10.8)

## 2020-10-22 LAB — HEMOGLOBIN A1C
Hgb A1c MFr Bld: 5.8 % of total Hgb — ABNORMAL HIGH (ref <5.7)
Mean Plasma Glucose: 120 mg/dL
eAG (mmol/L): 6.6 mmol/L

## 2020-10-22 LAB — RPR: RPR Ser Ql: NONREACTIVE

## 2020-10-22 LAB — HIV ANTIBODY (ROUTINE TESTING W REFLEX): HIV 1&2 Ab, 4th Generation: NONREACTIVE

## 2020-10-22 LAB — TRICHOMONAS VAGINALIS RNA, QL,MALES: Trichomonas vaginalis RNA: NOT DETECTED

## 2020-10-22 LAB — CHLAMYDIA/NEISSERIA GONORRHOEAE RNA,TMA,UROGENTIAL
C. trachomatis RNA, TMA: NOT DETECTED
N. gonorrhoeae RNA, TMA: NOT DETECTED

## 2020-10-31 ENCOUNTER — Encounter (HOSPITAL_COMMUNITY): Payer: Self-pay | Admitting: Emergency Medicine

## 2020-10-31 ENCOUNTER — Emergency Department (HOSPITAL_COMMUNITY)
Admission: EM | Admit: 2020-10-31 | Discharge: 2020-11-01 | Disposition: A | Discharge status: Other Acute Inpatient Hospital | Payer: 59 | Source: Home / Self Care | Attending: Emergency Medicine | Admitting: Emergency Medicine

## 2020-10-31 DIAGNOSIS — T50902A Poisoning by unspecified drugs, medicaments and biological substances, intentional self-harm, initial encounter: Secondary | ICD-10-CM

## 2020-10-31 DIAGNOSIS — J45909 Unspecified asthma, uncomplicated: Secondary | ICD-10-CM | POA: Insufficient documentation

## 2020-10-31 DIAGNOSIS — Y900 Blood alcohol level of less than 20 mg/100 ml: Secondary | ICD-10-CM | POA: Insufficient documentation

## 2020-10-31 DIAGNOSIS — E119 Type 2 diabetes mellitus without complications: Secondary | ICD-10-CM | POA: Insufficient documentation

## 2020-10-31 DIAGNOSIS — Z20822 Contact with and (suspected) exposure to covid-19: Secondary | ICD-10-CM | POA: Insufficient documentation

## 2020-10-31 DIAGNOSIS — Z87891 Personal history of nicotine dependence: Secondary | ICD-10-CM | POA: Insufficient documentation

## 2020-10-31 DIAGNOSIS — R45851 Suicidal ideations: Secondary | ICD-10-CM | POA: Insufficient documentation

## 2020-10-31 DIAGNOSIS — T43292A Poisoning by other antidepressants, intentional self-harm, initial encounter: Secondary | ICD-10-CM | POA: Insufficient documentation

## 2020-10-31 DIAGNOSIS — F332 Major depressive disorder, recurrent severe without psychotic features: Secondary | ICD-10-CM | POA: Diagnosis not present

## 2020-10-31 LAB — RAPID URINE DRUG SCREEN, HOSP PERFORMED
Amphetamines: NOT DETECTED
Barbiturates: NOT DETECTED
Benzodiazepines: NOT DETECTED
Cocaine: NOT DETECTED
Opiates: NOT DETECTED
Tetrahydrocannabinol: NOT DETECTED

## 2020-10-31 LAB — CBC
HCT: 42.4 % (ref 39.0–52.0)
Hemoglobin: 14.9 g/dL (ref 13.0–17.0)
MCH: 30.8 pg (ref 26.0–34.0)
MCHC: 35.1 g/dL (ref 30.0–36.0)
MCV: 87.8 fL (ref 80.0–100.0)
Platelets: 229 10*3/uL (ref 150–400)
RBC: 4.83 MIL/uL (ref 4.22–5.81)
RDW: 13.4 % (ref 11.5–15.5)
WBC: 6.3 10*3/uL (ref 4.0–10.5)
nRBC: 0 % (ref 0.0–0.2)

## 2020-10-31 LAB — COMPREHENSIVE METABOLIC PANEL
ALT: 38 U/L (ref 0–44)
AST: 27 U/L (ref 15–41)
Albumin: 4.6 g/dL (ref 3.5–5.0)
Alkaline Phosphatase: 56 U/L (ref 38–126)
Anion gap: 8 (ref 5–15)
BUN: 21 mg/dL — ABNORMAL HIGH (ref 6–20)
CO2: 26 mmol/L (ref 22–32)
Calcium: 9.4 mg/dL (ref 8.9–10.3)
Chloride: 106 mmol/L (ref 98–111)
Creatinine, Ser: 0.95 mg/dL (ref 0.61–1.24)
GFR, Estimated: >60 mL/min (ref >60)
Glucose, Bld: 139 mg/dL — ABNORMAL HIGH (ref 70–99)
Potassium: 4 mmol/L (ref 3.5–5.1)
Sodium: 140 mmol/L (ref 135–145)
Total Bilirubin: 0.6 mg/dL (ref 0.3–1.2)
Total Protein: 7.6 g/dL (ref 6.5–8.1)

## 2020-10-31 LAB — SALICYLATE LEVEL: Salicylate Lvl: <7 mg/dL — ABNORMAL LOW (ref 7.0–30.0)

## 2020-10-31 LAB — ETHANOL: Alcohol, Ethyl (B): <10 mg/dL (ref <10)

## 2020-10-31 LAB — MAGNESIUM: Magnesium: 2.2 mg/dL (ref 1.7–2.4)

## 2020-10-31 LAB — ACETAMINOPHEN LEVEL: Acetaminophen (Tylenol), Serum: <10 ug/mL — ABNORMAL LOW (ref 10–30)

## 2020-10-31 NOTE — ED Provider Notes (Addendum)
[facility]EMERGENCY DEPT Provider Note   CSN: [id number] Arrival date & time: 10/31/20  2013     History Chief Complaint  Patient presents with   Suicidal    Zaydyn Havey is a 40 y.o. male presenting to the ED with intentional overdose.  Patient reports he took 12 prozac tablets this evening along with some alcohol.  He tells me " I did it because I didn't really want to wake up."  He denies other drug use or other pill ingestion.  Reports a history of "thinking about hurting myself" but no prior suicide attempts.  He will not tell me what stressors he has in his life or what triggered this.  He lives by himself.  He does report that his partner left him recently.  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   Food allergy 06/29/2017   Pruritus 06/29/2017    Past Surgical History:  Procedure Laterality Date   corneal transplant left     EYE SURGERY         Family History  Problem Relation Age of Onset   Cancer Mother 75       pancreatic cancer   Hypertension Father    Heart disease Father 34       CAD   Bipolar disorder Brother    Diabetes Sister    Allergic rhinitis Neg Hx    Angioedema Neg Hx    Asthma Neg Hx    Eczema Neg Hx    Immunodeficiency Neg Hx    Urticaria Neg Hx     Social History   Tobacco Use   Smoking status: Former    Packs/day: 0.25    Years: 11.00    Pack years: 2.75    Types: Cigarettes    Quit date: 10/03/2017    Years since quitting: 3.0   Smokeless tobacco: Never  Vaping Use   Vaping Use: Never used  Substance Use Topics   Alcohol use: Yes     Alcohol/week: 1.0 standard drink    Types: 1 Standard drinks or equivalent per week   Drug use: No    Home Medications Prior to Admission medications   Medication Sig Start Date End Date Taking? Authorizing Provider  albuterol (VENTOLIN HFA) 108 (90 Base) MCG/ACT inhaler Inhale 2 puffs into the lungs every 6 (six) hours as needed for wheezing or shortness of breath. 09/26/19  Yes Bobbitt, Heywood Iles, MD  FLUoxetine (PROZAC) 40 MG capsule Take 40 mg by mouth daily.   Yes [provider]  levocetirizine (XYZAL) 5 MG tablet Take 5 mg by mouth in the morning.   Yes [provider]  Multiple Vitamins-Minerals (ONE-A-DAY MENS HEALTH FORMULA) TABS Take 1 tablet by mouth daily with breakfast.   Yes [provider]  sildenafil (VIAGRA) 50 MG tablet Take 50 mg by mouth daily as needed for erectile dysfunction. Patient not taking: Reported on 10/31/2020    [provider]    Allergies    Fish-derived products and Shellfish allergy  Review of Systems   Review of Systems  Constitutional:  Negative for chills and fever.  Cardiovascular:  Negative for chest pain and palpitations.  Gastrointestinal:  Negative for abdominal pain and vomiting.  Skin:  Negative for color change and rash.  Psychiatric/Behavioral:  Positive for self-injury and suicidal ideas.   All other systems reviewed and are negative.  Physical Exam Updated Vital Signs BP (!) 144/103   Pulse 78   Temp 99.5 F (37.5 C) (Oral)   Resp 15   Ht 5' 9.5" (1.765 m)   Wt 103 kg   SpO2 97%   BMI 33.04 kg/m   Physical Exam Constitutional:      General: He is not in acute distress. HENT:     Head: Normocephalic and atraumatic.  Eyes:     Conjunctiva/sclera: Conjunctivae normal.     Pupils: Pupils are equal, round, and reactive to light.  Cardiovascular:     Rate and Rhythm: Normal rate and regular rhythm.  Pulmonary:     Effort: Pulmonary effort is normal. No respiratory distress.   Abdominal:     General: There is no distension.     Tenderness: There is no abdominal tenderness.  Skin:    General: Skin is warm and dry.  Neurological:     General: No focal deficit present.     Mental Status: He is alert. Mental status is at baseline.  Psychiatric:        Mood and Affect: Mood normal.        Behavior: Behavior normal.    ED Results / Procedures / Treatments   Labs (all labs ordered are listed, but only abnormal results are displayed) Labs Reviewed  COMPREHENSIVE METABOLIC PANEL - Abnormal; Notable for the following components:      Result Value   Glucose, Bld 139 (*)    BUN 21 (*)    All other components within normal limits  SALICYLATE LEVEL - Abnormal; Notable for the following components:   Salicylate Lvl <7.0 (*)    All other components within normal limits  ACETAMINOPHEN LEVEL - Abnormal; Notable for the following components:   Acetaminophen (Tylenol), Serum <10 (*)    All other components within normal limits  ETHANOL  CBC  RAPID URINE DRUG SCREEN, HOSP PERFORMED  MAGNESIUM  ACETAMINOPHEN LEVEL    EKG None  Radiology No results found.  Procedures Procedures   Medications Ordered in ED Medications - No data to display  ED Course  I have reviewed the triage vital signs and the nursing notes.  Pertinent labs & imaging results that were available during my care of the patient were reviewed by me and considered in my medical decision making (see chart for details).  This patient presents to the Emergency Department with complaint of suicidal ideation and ingestion.  This involves an extensive number of treatment options, and is a complaint that carries with it a high risk of complications and morbidity.   Ingested prozac 40 mg tablets x 12 per his report. Stable on arrival - coherent and nondrowsy   *  Patient's blood test reviewed.  Alcohol level is negative.  Magnesium is normal.  CMP and Tylenol level are unremarkable.  UDS is  negative.  CBC normal.  There were no immediate, life-threatening emergencies found in this labwork.     Clinical Course as of 10/31/20 2328  Wed Oct 31, 2020  2116 Per discussion with poison control, patient requires 4 hour tylenol level at 2340. observe for 6 hours with repeat ekg.   monitor for qtc  above 500. [MT]  2158 Reviewed the patients initial ecg showing NSR< HR 88, Qtc 435, no acute ischemic findings.  There is a delay in the EKG crossing over to MUSE. [MT]  2324 Patient signed out to Dr Pilar Plate pending repeat ECG at 0200 and medical clearance - then will need TTS consult.  Although he is here voluntarily, I do believe he needs a TTS evaluation, and IVC would be appropriate if he attempts to leave. [MT]    Clinical Course User Index [MT] Kaydynce Pat, Kermit Balo, MD      Final Clinical Impression(s) / ED Diagnoses Final diagnoses:  Suicidal ideation  Intentional drug overdose, initial encounter [facility])    Rx / DC Orders ED Discharge Orders     None        Sammye Staff, Kermit Balo, MD 10/31/20 2328    Terald Sleeper, MD 10/31/20 2328

## 2020-10-31 NOTE — ED Triage Notes (Signed)
Pt from home. Pt reports taking 12 prozac about 40 minutes ago. States that he wanted to sleep and not wake up. This is his first antidepressant and he has been taking it since Oct. Reports increased stress lately. His friend called EMS.

## 2020-10-31 NOTE — ED Notes (Signed)
Provider at bedside

## 2020-10-31 NOTE — ED Provider Notes (Signed)
  Provider Note MRN:  [id number]  Arrival date & time: 11/01/20    ED Course and Medical Decision Making  Assumed care from Dr. Renaye Rakers at shift change.  Intentional overdose on Prozac, reassuring exam, normal vital signs, will be medically cleared at 2 AM after repeat EKG.  After that, will be appropriate for transition to behavioral health placement.  2:14 AM update: Repeat EKG is reassuring, normal intervals.  Labs normal.  Patient is now medically cleared awaiting TTS recommendations.  Procedures  Final Clinical Impressions(s) / ED Diagnoses     ICD-10-CM   1. Suicidal ideation  R45.851     2. Intentional drug overdose, initial encounter [facility])  T50.902A       ED Discharge Orders     None       Discharge Instructions   None     Elmer Sow. Pilar Plate, MD [facility] Emergency Medicine [facility] [email or web address]    Sabas Sous, MD 11/01/20 [id number]

## 2020-11-01 ENCOUNTER — Inpatient Hospital Stay (HOSPITAL_COMMUNITY)
Admission: AD | Admit: 2020-11-01 | Discharge: 2020-11-02 | DRG: 885 | Disposition: A | Discharge status: Home / Self Care | Payer: 59 | Source: Intra-hospital | Attending: Psychiatry | Admitting: Psychiatry

## 2020-11-01 ENCOUNTER — Encounter (HOSPITAL_COMMUNITY): Payer: Self-pay | Admitting: Emergency Medicine

## 2020-11-01 ENCOUNTER — Other Ambulatory Visit: Payer: Self-pay

## 2020-11-01 DIAGNOSIS — Y92009 Unspecified place in unspecified non-institutional (private) residence as the place of occurrence of the external cause: Secondary | ICD-10-CM | POA: Diagnosis not present

## 2020-11-01 DIAGNOSIS — Z947 Corneal transplant status: Secondary | ICD-10-CM

## 2020-11-01 DIAGNOSIS — Z833 Family history of diabetes mellitus: Secondary | ICD-10-CM

## 2020-11-01 DIAGNOSIS — T43222A Poisoning by selective serotonin reuptake inhibitors, intentional self-harm, initial encounter: Secondary | ICD-10-CM | POA: Diagnosis present

## 2020-11-01 DIAGNOSIS — Z20822 Contact with and (suspected) exposure to covid-19: Secondary | ICD-10-CM | POA: Diagnosis present

## 2020-11-01 DIAGNOSIS — Z8249 Family history of ischemic heart disease and other diseases of the circulatory system: Secondary | ICD-10-CM | POA: Diagnosis not present

## 2020-11-01 DIAGNOSIS — Z818 Family history of other mental and behavioral disorders: Secondary | ICD-10-CM

## 2020-11-01 DIAGNOSIS — J45909 Unspecified asthma, uncomplicated: Secondary | ICD-10-CM | POA: Diagnosis present

## 2020-11-01 DIAGNOSIS — F332 Major depressive disorder, recurrent severe without psychotic features: Secondary | ICD-10-CM | POA: Diagnosis present

## 2020-11-01 DIAGNOSIS — Z91013 Allergy to seafood: Secondary | ICD-10-CM

## 2020-11-01 DIAGNOSIS — Z800 Family history of malignant neoplasm of digestive organs: Secondary | ICD-10-CM

## 2020-11-01 DIAGNOSIS — N529 Male erectile dysfunction, unspecified: Secondary | ICD-10-CM | POA: Diagnosis present

## 2020-11-01 DIAGNOSIS — Z87891 Personal history of nicotine dependence: Secondary | ICD-10-CM | POA: Diagnosis not present

## 2020-11-01 LAB — RESP PANEL BY RT-PCR (FLU A&B, COVID) ARPGX2
Influenza A by PCR: NEGATIVE
Influenza B by PCR: NEGATIVE
SARS Coronavirus 2 by RT PCR: NEGATIVE

## 2020-11-01 LAB — ACETAMINOPHEN LEVEL: Acetaminophen (Tylenol), Serum: <10 ug/mL — ABNORMAL LOW (ref 10–30)

## 2020-11-01 MED ORDER — MAGNESIUM HYDROXIDE 400 MG/5ML PO SUSP: 30.0000 mL | Freq: Every day | ORAL | Status: DC | PRN

## 2020-11-01 MED ORDER — LORATADINE 10 MG PO TABS
10.0000 mg | ORAL_TABLET | Freq: Every day | ORAL | Status: DC
  Administered 2020-11-01 – 2020-11-02 (×2): 10 mg via ORAL
  Filled 2020-11-01 (×4): qty 1

## 2020-11-01 MED ORDER — LEVOCETIRIZINE DIHYDROCHLORIDE 5 MG PO TABS: 5.0000 mg | ORAL_TABLET | Freq: Every morning | ORAL | Status: DC

## 2020-11-01 MED ORDER — ALUM & MAG HYDROXIDE-SIMETH 200-200-20 MG/5ML PO SUSP: 30.0000 mL | ORAL | Status: DC | PRN

## 2020-11-01 MED ORDER — ALBUTEROL SULFATE HFA 108 (90 BASE) MCG/ACT IN AERS: 2.0000 | INHALATION_SPRAY | Freq: Four times a day (QID) | RESPIRATORY_TRACT | Status: DC | PRN

## 2020-11-01 MED ORDER — ACETAMINOPHEN 325 MG PO TABS: 650.0000 mg | ORAL_TABLET | Freq: Four times a day (QID) | ORAL | Status: DC | PRN

## 2020-11-01 MED ORDER — ADULT MULTIVITAMIN W/MINERALS CH
1.0000 | ORAL_TABLET | Freq: Every day | ORAL | Status: DC
  Administered 2020-11-01 – 2020-11-02 (×2): 1 via ORAL
  Filled 2020-11-01 (×4): qty 1

## 2020-11-01 MED ORDER — ONE-A-DAY MENS HEALTH FORMULA PO TABS: 1.0000 | ORAL_TABLET | Freq: Every day | ORAL | Status: DC

## 2020-11-01 NOTE — Tx Team (Signed)
Initial Treatment Plan 11/01/2020 9:11 AM [name] EHM:[id number]    PATIENT STRESSORS: Financial difficulties Health problems Occupational concerns   PATIENT STRENGTHS: Ability for insight Average or above average intelligence Motivation for treatment/growth Physical Health Supportive family/friends Work skills   PATIENT IDENTIFIED PROBLEMS: anxiety  depression  si  Overdose attempt               DISCHARGE CRITERIA:  Ability to meet basic life and health needs Improved stabilization in mood, thinking, and/or behavior Motivation to continue treatment in a less acute level of care Need for constant or close observation no longer present  PRELIMINARY DISCHARGE PLAN: Attend aftercare/continuing care group Outpatient therapy Return to previous living arrangement Return to previous work or school arrangements  PATIENT/FAMILY INVOLVEMENT: This treatment plan has been presented to and reviewed with the patient, [place].  The patient and family have been given the opportunity to ask questions and make suggestions.  Raylene Miyamoto, RN 11/01/2020, 9:11 AM

## 2020-11-01 NOTE — Progress Notes (Signed)
Alexander Osborne was in the day room much of the evening.  He interacts well with staff and peers.  He attended evening wrap up group.  He denied SI/HI or AVH.  He stated that he is ready to go home and doesn't believe that he needs to be here.  Discussed his overdose and he is minimizing his symptoms. He declined any prn's this evening.  He is currently resting with his eyes closed and appears to be asleep.  Q 15 minute checks maintained for safety.   11/01/20 2040  Psych Admission Type (Psych Patients Only)  Admission Status Voluntary  Psychosocial Assessment  Patient Complaints None  Eye Contact Fair  Facial Expression Anxious  Affect Anxious  Speech Logical/coherent  Interaction Forwards little  Motor Activity Other (Comment) (Unremarkable)  Appearance/Hygiene Unremarkable  Behavior Characteristics Cooperative;Appropriate to situation  Mood Anxious  Thought Process  Coherency WDL  Content Ambivalence  Delusions None reported or observed  Perception WDL  Hallucination None reported or observed  Judgment Poor  Confusion None  Danger to Self  Current suicidal ideation? Denies  Danger to Others  Danger to Others None reported or observed

## 2020-11-01 NOTE — ED Notes (Signed)
[facility] Pt can be transported after 8am.  Pt currently resting comfortably.

## 2020-11-01 NOTE — [facility] Group Notes (Signed)
[facility] Group Notes:  (Nursing/MHT/Case Management/Adjunct)  Date:  11/01/2020  Time:  8:35 PM  Type of Therapy:  Group Therapy  Participation Level:  Active  Participation Quality:  Appropriate  Affect:  Appropriate  Cognitive:  Appropriate  Insight:  Improving  Engagement in Group:  Developing/Improving  Modes of Intervention:  Discussion  Summary of Progress/Problems:  Nakshatra Klose Anne Michel Hendon 11/01/2020, 8:35 PM 

## 2020-11-01 NOTE — ED Notes (Signed)
Safe transport has been called to take pt to [facility]. [facility] informed pt will be at facility after 0800.

## 2020-11-01 NOTE — Progress Notes (Signed)
Pt did not attend group. 

## 2020-11-01 NOTE — Progress Notes (Signed)
Patient ID: Shariq Puig, male   DOB: [birth year], 40 y.o.   MRN: [id number] Admission Note  Pt is a 40 yo male that presents voluntarily on 11/01/2020 with worsening anxiety and depression. Pt had an attempted overdose of their medication. Pt is guarded and minimal with their assessment, stating they are "going through things". Pt denies alcohol or drug abuse. Pt is a former smoker. Pt states they have a pcp. Pt has a hx of asthma. Pt denies current si/hi/ah/vh and verbally agrees to approach staff if these become apparent and/or before harming self/others while at [facility]. Consents signed, handbook detailing the patient's rights, responsibilities, and visitor guidelines provided. Skin/belongings search completed and patient oriented to unit. Patient stable at this time. Patient given the opportunity to express concerns and ask questions. Patient given toiletries. Will continue to monitor.   [facility] assessment 11/01/2020:  Rasmus Preusser is a 40 year old male who presents voluntary and unaccompanied to [facility]. Clinician asked the pt, "what brought you to the hospital?" Pt reports, he was on FaceTime with one of his boyfriends, while on FaceTime he took 12, 40 mg Prozac tablets. Per pt, his boyfriend called his other boyfriend who came over and encouraged him to throw up. Pt reported, he didn't want to throw up he wanted to lay in bed, go to sleep and not wake up. Pt reports, his boyfriend then called 911. Clinician asked the pt if his intent was to kill himself by taking Prozac. Pt reports, he was being dramatic. Pt reports, "feeling like people are not listening to me," triggered him to take the medication. Pt reports, he's currently passively suicidal. Pt denies, HI, AVH, self-injurious behaviors and access to weapons.

## 2020-11-01 NOTE — ED Notes (Signed)
PT belonging bag has been placed in  [facility] pt cabinet. Pt has 1 belonging bag.
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
## 2020-11-01 NOTE — BHH Suicide Risk Assessment (Signed)
[facility] Admission Suicide Risk Assessment   Nursing information obtained from:  Patient Demographic factors:  Living alone, Adolescent or young adult, Low socioeconomic status Current Mental Status:  Suicidal ideation indicated by patient, Plan includes specific time, place, or method, Intention to act on suicide plan, Suicidal ideation indicated by others, Belief that plan would result in death, Suicide plan Loss Factors:  Financial problems / change in socioeconomic status Historical Factors:  Impulsivity Risk Reduction Factors:  Employed, Positive social support, Positive therapeutic relationship  Total Time spent with patient: 30 minutes Principal Problem: <principal problem not specified> Diagnosis:  Active Problems:   MDD (major depressive disorder), recurrent severe, without psychosis (HCC)  Subjective Data: Patient is seen and examined.  Patient is a 40 year old male with a reported past psychiatric history significant for depression who presented to the [facility] emergency department on 10/31/2020 after he had taken 1220 mg Prozac tablets.  He stated he wanted to sleep and not wake up.  He admitted to me that he had had a argument with his significant other who lives in [place].  They had an argument, and he decided to take the overdose.  He was seen in the emergency department, and the decision was made to admit to the hospital.  Patient stated he had never attempted suicide in the past, no self-harm.  No previous psychiatric admissions.  He had been treated by a psychiatrist and then his insurance to change several times.  He had seen local psychiatrist as well as telemetry psychiatrist, and recently saw a physician through a men's health website.  He currently denied suicidal ideation.  No evidence of substance-induced problems.  He has a family history of bipolar disorder as well as depression, but no evidence of any symptoms including racing thoughts, pressured speech,  excessive spending, significant insomnia or euphoria.  He was admitted to the hospital for evaluation and stabilization.  Continued Clinical Symptoms:  Alcohol Use Disorder Identification Test Final Score (AUDIT): 3 The "Alcohol Use Disorders Identification Test", Guidelines for Use in Primary Care, Second Edition.  World Science writer [facility]). Score between 0-7:  no or low risk or alcohol related problems. Score between 8-15:  moderate risk of alcohol related problems. Score between 16-19:  high risk of alcohol related problems. Score 20 or above:  warrants further diagnostic evaluation for alcohol dependence and treatment.   CLINICAL FACTORS:   Depression:   Impulsivity   Musculoskeletal: Strength & Muscle Tone: within normal limits Gait & Station: normal Patient leans: N/A  Psychiatric Specialty Exam:  Presentation  General Appearance:  No data recorded Eye Contact: No data recorded Speech: No data recorded Speech Volume: No data recorded Handedness: No data recorded  Mood and Affect  Mood: No data recorded Affect: No data recorded  Thought Process  Thought Processes: No data recorded Descriptions of Associations:No data recorded Orientation:No data recorded Thought Content:No data recorded History of Schizophrenia/Schizoaffective disorder:No data recorded Duration of Psychotic Symptoms:No data recorded Hallucinations:No data recorded Ideas of Reference:No data recorded Suicidal Thoughts:No data recorded Homicidal Thoughts:No data recorded  Sensorium  Memory: No data recorded Judgment: No data recorded Insight: No data recorded  Executive Functions  Concentration: No data recorded Attention Span: No data recorded Recall: No data recorded Fund of Knowledge: No data recorded Language: No data recorded  Psychomotor Activity  Psychomotor Activity: No data recorded  Assets  Assets: No data recorded  Sleep  Sleep: No data  recorded   Physical Exam: Physical Exam Vitals and  nursing note reviewed.  Constitutional:      Appearance: Normal appearance.  HENT:     Head: Normocephalic and atraumatic.  Pulmonary:     Effort: Pulmonary effort is normal.  Neurological:     General: No focal deficit present.     Mental Status: He is alert and oriented to person, place, and time.   Review of Systems  All other systems reviewed and are negative. Blood pressure (!) 127/95, pulse 77, temperature 98.2 F (36.8 C), temperature source Oral, resp. rate 16, height 5\' 9"  (1.753 m), weight 103 kg, SpO2 100 %. Body mass index is 33.52 kg/m.   COGNITIVE FEATURES THAT CONTRIBUTE TO RISK:  None    SUICIDE RISK:   Minimal: No identifiable suicidal ideation.  Patients presenting with no risk factors but with morbid ruminations; may be classified as minimal risk based on the severity of the depressive symptoms  PLAN OF CARE: Patient is seen and examined.  Patient is a 40 year old male with the above-stated past psychiatric history who was admitted after taking an intentional overdose of Prozac.  He will be admitted to the hospital.  He will be integrated in the milieu.  He will be encouraged to attend groups.  I explained to the patient that 12- 20 mg fluoxetine tablets are not to worry about.  Previously Prozac had been dosed at an 80 mg dosage weekly as Prozac weekly.  We discussed holding his medication for several days after discharge and then restarting it.  He is interested in getting a new psychiatrist and therapist as an outpatient.  Review of his admission laboratories showed essentially normal electrolytes except for a mildly elevated glucose which was nonfasting.  Liver function enzymes were normal.  CBC was normal.  Differential was normal.  Acetaminophen was less than 10, salicylate less than 7.  Hemoglobin A1c is 5.8.  RPR was nonreactive.  Influenza A, B and coronavirus were all negative.  HIV from 6/30 was negative.   Blood alcohol was less than 10, drug screen was negative.  We will watch him overnight and if everything goes well discharging home tomorrow.  I certify that inpatient services furnished can reasonably be expected to improve the patient's condition.   7/30, MD 11/01/2020, 1:37 PM

## 2020-11-02 ENCOUNTER — Telehealth: Payer: Self-pay

## 2020-11-02 DIAGNOSIS — F332 Major depressive disorder, recurrent severe without psychotic features: Principal | ICD-10-CM

## 2020-11-02 LAB — LIPID PANEL
Cholesterol: 213 mg/dL — ABNORMAL HIGH (ref 0–200)
HDL: 41 mg/dL (ref >40)
LDL Cholesterol: 147 mg/dL — ABNORMAL HIGH (ref 0–99)
Total CHOL/HDL Ratio: 5.2 RATIO
Triglycerides: 124 mg/dL (ref <150)
VLDL: 25 mg/dL (ref 0–40)

## 2020-11-02 LAB — GLUCOSE, CAPILLARY: Glucose-Capillary: 105 mg/dL — ABNORMAL HIGH (ref 70–99)

## 2020-11-02 LAB — TSH: TSH: 1.485 u[IU]/mL (ref 0.350–4.500)

## 2020-11-02 MED ORDER — FLUOXETINE HCL 20 MG PO CAPS
20.0000 mg | ORAL_CAPSULE | Freq: Every day | ORAL | Status: DC
  Filled 2020-11-02 (×2): qty 1

## 2020-11-02 MED ORDER — FLUOXETINE HCL 40 MG PO CAPS: 40.0000 mg | ORAL_CAPSULE | Freq: Every day | ORAL | 0 refills | Status: DC

## 2020-11-02 NOTE — Plan of Care (Signed)
  Problem: Education: Goal: Ability to state activities that reduce stress will improve Outcome: Progressing   Problem: Coping: Goal: Ability to identify and develop effective coping behavior will improve Outcome: Progressing   Problem: Self-Concept: Goal: Ability to identify factors that promote anxiety will improve Outcome: Progressing   

## 2020-11-02 NOTE — [facility] Suicide Risk Assessment (Signed)
[facility] INPATIENT:  Family/Significant Other Suicide Prevention Education  Suicide Prevention Education:  Education Completed; Nickie Retort,  (name of family member/significant other) has been identified by the patient as the family member/significant other with whom the patient will be residing, and identified as the person(s) who will aid the patient in the event of a mental health crisis (suicidal ideations/suicide attempt).  With written consent from the patient, the family member/significant other has been provided the following suicide prevention education, prior to the and/or following the discharge of the patient.  CSW spoke with patient sister regarding collateral information and suicide prevention education.  Sister reports some depression recently and he has been seeing an outpatient therapist/pscyhiatrist.  Sister reports no previous attempts and discussed that she has never had a previous safety concern.  Sister reports that one of his friend of a friends had a similar situation where the person overdosed on facetime.  Sister reports that she had talked with patient and patient discussed to her that the incident was too get attention more than a suicide attempt. Sister reports that she plans to pick patient up and will ensure apartment is safe. Sister reports that there are no firearms/weapons in the house.   The suicide prevention education provided includes the following: Suicide risk factors Suicide prevention and interventions National Suicide Hotline telephone number [facility] assessment telephone number [facility] Emergency Assistance 911 [facility] and/or Residential Mobile Crisis Unit telephone number  Request made of family/significant other to: Remove weapons (e.g., guns, rifles, knives), all items previously/currently identified as safety concern.   Remove drugs/medications (over-the-counter, prescriptions, illicit drugs), all items previously/currently  identified as a safety concern.  The family member/significant other verbalizes understanding of the suicide prevention education information provided.  The family member/significant other agrees to remove the items of safety concern listed above.  Tyriek Hofman E Vester Balthazor 11/02/2020, 11:01 AM

## 2020-11-02 NOTE — Discharge Summary (Signed)
Physician Discharge Summary Note  Patient:  Alexander Osborne is an 40 y.o., male MRN:  [id number] DOB:  January 10, 1981 Patient phone:  [phone] (home)  Patient address:   [address] [place] [address],  Total Time spent with patient:  Greater than 30 minutes  Date of Admission:  11/01/2020  Date of Discharge: 11-02-20  Reason for Admission: Intentional suicide attempt by overdose on Prozac as he wanted to sleep & not wake-up.  Principal Problem: MDD (major depressive disorder), recurrent severe, without psychosis (HCC)  Discharge Diagnoses: Principal Problem:   MDD (major depressive disorder), recurrent severe, without psychosis (HCC)  Past Psychiatric History: Major depressive disorder.  Past Medical History:  Past Medical History:  Diagnosis Date   Allergy    Anxiety    Asthma    Depression    Diabetes mellitus without complication (HCC)     Past Surgical History:  Procedure Laterality Date   corneal transplant left     EYE SURGERY     Family History:  Family History  Problem Relation Age of Onset   Cancer Mother 60       pancreatic cancer   Hypertension Father    Heart disease Father 78       CAD   Bipolar disorder Brother    Diabetes Sister    Allergic rhinitis Neg Hx    Angioedema Neg Hx    Asthma Neg Hx    Eczema Neg Hx    Immunodeficiency Neg Hx    Urticaria Neg Hx    Family Psychiatric  History: See H&P  Social History:  Social History   Substance and Sexual Activity  Alcohol Use Yes   Alcohol/week: 1.0 standard drink   Types: 1 Standard drinks or equivalent per week     Social History   Substance and Sexual Activity  Drug Use No    Social History   Socioeconomic History   Marital status: Legally Separated    Spouse name: Not on file   Number of children: 0   Years of education: Not on file   Highest education level: Not on file  Occupational History   Occupation: Event organiser: Radio producer   Tobacco Use   Smoking status: Former    Packs/day: 0.25    Years: 11.00    Pack years: 2.75    Types: Cigarettes    Quit date: 10/03/2017    Years since quitting: 3.0   Smokeless tobacco: Never  Vaping Use   Vaping Use: Never used  Substance and Sexual Activity   Alcohol use: Yes    Alcohol/week: 1.0 standard drink    Types: 1 Standard drinks or equivalent per week   Drug use: No   Sexual activity: Yes    Partners: Male    Birth control/protection: None    Comment: SS partner  Other Topics Concern   Not on file  Social History Narrative   Marital status: married x 4 years; happily married.       Lives; with husbnad      Employment: Building control surveyor at [employer]      Tobacco: socially; weekends      Alcohol: 1-3 drinks per week      Drugs: none      Exercise: none   Denies caffeine use    Social Determinants of Corporate investment banker Strain: Not on file  Food Insecurity: Not on file  Transportation Needs: Not on file  Physical Activity: Not on file  Stress: Not on file  Social Connections: Not on file   Hospital Course: (Per Md's admission evaluation notes): Patient is seen and examined.  Patient is a 40 year old male with a reported past psychiatric history significant for depression who presented to the [facility] emergency department on 10/31/2020 after he had taken 1220 mg Prozac tablets.  He stated he wanted to sleep and not wake up.  He admitted to me that he had had a argument with his significant other who lives in [place].  They had an argument, and he decided to take the overdose.  He was seen in the emergency department, and the decision was made to admit to the hospital.  Patient stated he had never attempted suicide in the past, no self-harm.  No previous psychiatric admissions.  He had been treated by a psychiatrist and then his insurance to change several times.  He had seen local psychiatrist as well as telemetry psychiatrist, and  recently saw a physician through a men's health website.  He currently denied suicidal ideation.  No evidence of substance-induced problems.  He has a family history of bipolar disorder as well as depression, but no evidence of any symptoms including racing thoughts, pressured speech, excessive spending, significant insomnia or euphoria.  He was admitted to the hospital for evaluation and stabilization.  Prior to this discharge, Josua was seen & evaluated for mental health stability. The current laboratory findings were reviewed (stable), nurses notes & vital signs were reviewed as well. There are no current mental health or medical issues that should prevent this discharge at this time. Patient is being discharged to continue mental health care as noted below.   This is the first psychiatric admission/discharged summary from this [facility] for this 40 year old [name]. He does have history of of depression & was on Prozac. He was admitted with complaints of intentional suicide attempt by overdose on 120 mg of Prozac as he wanted to sleep & not wake-up. He reported that this was triggered by an argument with his significant other who lives in [place]. He was brought to the hospital for evaluation/treatment.   After the above admission evaluation, Alexander Osborne was recommended for mood stabilization treatments. The medication regimen targeting those presenting symptoms were discussed with him & initiated with his consent. He was restarted on his Prozac & other medications for his other medical symptoms. Besides the mood stabilization treatments, he was was also enrolled in the group counseling sessions being offered & held on this unit to learn coping skills. During the follow-up care evaluation this am, Alexander Osborne asked to be discharged as he is no longer feeling depressed or suicidal. He stated that the overdose incident was done to get attention from his friends as he took the overdose during a face time with those friends.  He says he knew that they will call help for him if they witnessed him take the pills. He says that was exactly what end up happening. He says he is now feeling better & does no longer want to stay in the hospital. Patient also endorsed that he is mentally/medically stable.  During the course of this brief hospitalization, there were no instances of behavioral issues that required restraints or immediate intervention. Patient remained safe on the unit. There were no instances of self-harming behavior. There were no threats to other patients/staff. He remained cooperative to his daily routines & taking his treatment regimen as recommended by his attending psychiatrist.  He participated in some group sessions and interacted with staff and other patients appropriately. Over the course of this brief hospitalization, he reports that his symptoms has improved. He was able to maintain his personal hygiene. He denies any thoughts of self-harm, suicidal/homicidal ideations. He denies any SIHI, AVH, delusional thoughts or paranoia. He is not observed to be responding to internal any internal stimuli. He has been compliant with his recommended treatment regimen. He is currently showing readiness for discharge & is future-oriented thinking.   And because there are no current clinical criteria to keep this patient admitted to the hospital, he is being discharged as requested. Patient is encouraged to keep all psychiatric appointments and continue with his medications as prescribed. Upon discharge, he denies any SIHI, AVH, delusional thoughts or paranoia. He does not appear to be responding to any internal stimuli. He is instructed & encouraged to call 911, go to the nearest ED or [facility] in the event of worsening symptoms or if SIHI, AVH, delusions or paranoia develop. Patient verbalized his understanding of this instructions. [facility] in no apparent distress. Transportation per his  sister, Byrd HesselbachMaria.     Physical Findings: AIMS: Facial and Oral Movements Muscles of Facial Expression: None, normal Lips and Perioral Area: None, normal Jaw: None, normal Tongue: None, normal,Extremity Movements Upper (arms, wrists, hands, fingers): None, normal Lower (legs, knees, ankles, toes): None, normal, Trunk Movements Neck, shoulders, hips: None, normal, Overall Severity Severity of abnormal movements (highest score from questions above): None, normal Incapacitation due to abnormal movements: None, normal Patient's awareness of abnormal movements (rate only patient's report): No Awareness, Dental Status Current problems with teeth and/or dentures?: No Does patient usually wear dentures?: No  CIWA:    COWS:     Musculoskeletal: Strength & Muscle Tone: within normal limits Gait & Station: normal Patient leans: N/A  Psychiatric Specialty Exam: Presentation   General Appearance:  Appropriate for Environment; Casual; Fairly Groomed  Eye Contact: Good  Speech: Clear and Coherent; Normal Rate  Speech Volume: Normal  Handedness: Right  Mood and Affect   Mood: Euthymic  Affect: Appropriate; Congruent; Full Range  Thought Process   Thought Processes: Coherent; Goal Directed  Descriptions of Associations:Intact  Orientation:Full (Time, Place and Person)  Thought Content:Logical  History of Schizophrenia/Schizoaffective disorder:No data recorded  Duration of Psychotic Symptoms:No data recorded  Hallucinations:Hallucinations: None  Ideas of Reference:None  Suicidal Thoughts:Suicidal Thoughts: No  Homicidal Thoughts:Homicidal Thoughts: No  Sensorium  Memory: Immediate Good; Recent Good; Remote Good Judgment: Good Insight: Good  Executive Functions  Concentration: Good  Attention Span: Good  Recall: Good  Fund of Knowledge: Good  Language: Good  Psychomotor Activity   Psychomotor Activity: Psychomotor Activity: Normal  Assets   Assets: Communication Skills; Desire for Improvement; Housing; Physical Health; Resilience; Social Support  Sleep  Sleep: Sleep: Good Number of Hours of Sleep: 6.75  Physical Exam: Physical Exam Vitals and nursing note reviewed.  HENT:     Head: Normocephalic.     Nose: Nose normal.     Mouth/Throat:     Pharynx: Oropharynx is clear.  Eyes:     Pupils: Pupils are equal, round, and reactive to light.  Cardiovascular:     Rate and Rhythm: Normal rate.     Pulses: Normal pulses.  Pulmonary:     Effort: Pulmonary effort is normal.  Genitourinary:    Comments: Deferred Musculoskeletal:        General: Normal range of  motion.     Cervical back: Normal range of motion.  Skin:    General: Skin is warm and dry.  Neurological:     General: No focal deficit present.     Mental Status: He is alert and oriented to person, place, and time. Mental status is at baseline.   Review of Systems  Constitutional: Negative.   HENT: Negative.  Negative for congestion and sore throat.   Eyes: Negative.   Respiratory: Negative.  Negative for cough, shortness of breath and wheezing.   Cardiovascular: Negative.  Negative for chest pain and palpitations.  Gastrointestinal: Negative.  Negative for abdominal pain, constipation, diarrhea, heartburn, nausea and vomiting.  Genitourinary: Negative.   Musculoskeletal:  Negative for joint pain and myalgias.  Skin: Negative.   Neurological:  Negative for dizziness, tingling, tremors, sensory change, speech change, focal weakness, seizures, loss of consciousness, weakness and headaches.  Endo/Heme/Allergies:        Allergies:  Food allergies: Shellfish/fish products  Psychiatric/Behavioral:  Positive for depression (Hx. of (stable on medication). Negative for hallucinations, memory loss, substance abuse and suicidal ideas. The patient has insomnia (Hx. of (stable on medication)). The patient is not nervous/anxious (Stable upon discharge).   Blood  pressure 125/90, pulse 74, temperature 98.3 F (36.8 C), temperature source Oral, resp. rate 16, height 5\' 9"  (1.753 m), weight 103 kg, SpO2 99 %. Body mass index is 33.52 kg/m.   Social History   Tobacco Use  Smoking Status Former   Packs/day: 0.25   Years: 11.00   Pack years: 2.75   Types: Cigarettes   Quit date: 10/03/2017   Years since quitting: 3.0  Smokeless Tobacco Never   Tobacco Cessation:  N/A, patient does not currently use tobacco products   Blood Alcohol level:  Lab Results  Component Value Date   ETH <10 10/31/2020    Metabolic Disorder Labs:  Lab Results  Component Value Date   HGBA1C 5.8 (H) 10/18/2020   MPG 120 10/18/2020   No results found for: PROLACTIN Lab Results  Component Value Date   CHOL 213 (H) 11/02/2020   TRIG 124 11/02/2020   HDL 41 11/02/2020   CHOLHDL 5.2 11/02/2020   VLDL 25 11/02/2020   LDLCALC 147 (H) 11/02/2020   LDLCALC 102 (H) 11/28/2019   See Psychiatric Specialty Exam and Suicide Risk Assessment completed by Attending Physician prior to discharge.  Discharge destination:  Home  Is patient on multiple antipsychotic therapies at discharge:  No   Has Patient had three or more failed trials of antipsychotic monotherapy by history:  No  Recommended Plan for Multiple Antipsychotic Therapies: NA  Discharge Instructions     Diet - low sodium heart healthy   Complete by: As directed    Increase activity slowly   Complete by: As directed       Allergies as of 11/02/2020       Reactions   Fish-derived Products Other (See Comments)   Allergic, but reaction not recalled   Shellfish Allergy Other (See Comments)   Patient does not recall the reaction        Medication List     TAKE these medications      Indication  albuterol 108 (90 Base) MCG/ACT inhaler Commonly known as: VENTOLIN HFA Inhale 2 puffs into the lungs every 6 (six) hours as needed for wheezing or shortness of breath.  Indication: Asthma    FLUoxetine 40 MG capsule Commonly known as: PROZAC Take 1 capsule (40 mg total) by mouth  daily.  Indication: Depression   levocetirizine 5 MG tablet Commonly known as: XYZAL Take 5 mg by mouth in the morning.  Indication: Perennial Allergic Rhinitis   One-A-Day Mens Health Formula Tabs Take 1 tablet by mouth daily with breakfast.  Indication: nutrition   sildenafil 50 MG tablet Commonly known as: VIAGRA Take 50 mg by mouth daily as needed for erectile dysfunction.  Indication: Erectile Dysfunction        Follow-up Information     [facility] [place] Follow up on 11/26/2020.   Specialty: Behavioral Health Why: You have an appointment for medication management on 11/26/20 at 11:00 am. You also have an appointment for therapy services on 12/13/20 at 1:00 pm.     These are Virtual appointments. Contact information: [address] [place] [address] [phone]        [facility] [place]. Call.   Specialty: Behavioral Health Why: Please contact this provider to schedule an interim appointment for therapy services, as we were unable to contact. Contact information: [address] [id number] mc [place] [address] [phone]               Follow-up recommendations: Activity:  As tolerated Diet: As recommended by your primary care doctor. Keep all scheduled follow-up appointments as recommended.   Comments: Prescriptions given at discharge.  Patient agreeable to plan.  Given opportunity to ask questions.  Appears to feel comfortable with discharge denies any current suicidal or homicidal thought. Patient is also instructed prior to discharge to: Take all medications as prescribed by his/her mental healthcare provider. Report any adverse effects and or reactions from the medicines to his/her outpatient provider promptly. Patient has been instructed &  cautioned: To not engage in alcohol and or illegal drug use while on prescription medicines. In the event of worsening symptoms, patient is instructed to call the crisis hotline, 911 and or go to the nearest ED for appropriate evaluation and treatment of symptoms. To follow-up with his/her primary care provider for your other medical issues, concerns and or health care needs.   Signed: Armandina Stammer, NP, pmhnp, fnp-bc 11/02/2020, 11:45 AM

## 2020-11-02 NOTE — Progress Notes (Signed)
  [facility] Adult Case Management Discharge Plan :  Will you be returning to the same living situation after discharge:  Yes,  picked up by sister to go home to apartment. Patient states that he plans to stay with a friend for a night At discharge, do you have transportation home?: Yes,  Sister, Waldon Reining you have the ability to pay for your medications: Yes,  insurance  Release of information consent forms completed and in the chart;  Patient's signature needed at discharge.  Patient to Follow up at:  Follow-up Information     [facility] [place] Follow up on 11/26/2020.   Specialty: Behavioral Health Why: You have an appointment for medication management on 11/26/20 at 11:00 am. You also have an appointment for therapy services on 12/13/20 at 1:00 pm.     These are Virtual appointments. Contact information: [address] [place] [address] [place] [address] [phone]        [facility] [place]. Call.   Specialty: Behavioral Health Why: Please contact this provider to schedule an interim appointment for therapy services, as we were unable to contact. Contact information: [address] [id number] mc [place] [address] [phone]                Next level of care provider has access to [facility] Link:yes  Safety Planning and Suicide Prevention discussed: Yes,  Byrd Hesselbach, sister  Have you used any form of tobacco in the last 30 days? (Cigarettes, Smokeless Tobacco, Cigars, and/or Pipes): No  Has patient been referred to the Quitline?: N/A patient is not a smoker  Patient has been referred for addiction treatment: N/A  Charlcie Prisco E Buell Parcel, LCSW 11/02/2020, 11:06 AM

## 2020-11-02 NOTE — [facility] Counselor (Signed)
Adult Comprehensive Assessment Summary/Recommendations:   Summary and Recommendations (to be completed by the evaluator): Alexander Osborne is a 40 year old male who presented to [facility] due to taking 40 prozac pills.  Patient states that he did it to get attention because he felt like no one was listening to him. Patient reports that he feels embarrassed about the situation. Patient has had no prior suicidal attempts.  Patient states that he has been in outpatient therapy and seen a psychiatrist in the past in the after breaking up with a long term relationship of  8 years.  Patient stated that he broke up with them in December 2021.  Patient states that he has changed jobs within the past couple of months which made him have to change providers and he currently is not set up with outpatient services. Patient reports living by himself in an apartment in [place] which has been a hard adjustment after living with previous partner for 8 years. While here, Alexander Osborne can benefit from crisis stabilization, medication management, therapeutic milieu, and referrals for services.  Due to patient being discharged the same day as being admitted, CSW was unable to complete for CSW assessment.  CSW did a short assessment and learned of acute stressors and ways to provide follow up referrals for patient upon discharge.  Alexander Osborne. 11/02/2020

## 2020-11-02 NOTE — Plan of Care (Signed)
Discharge note  Patient verbalizes readiness for discharge. Follow up plan explained, AVS, Transition record and SRA given. Prescriptions and teaching provided. Belongings returned and signed for. Suicide safety plan completed and signed. Patient verbalizes understanding. Patient denies SI/HI and assures this Clinical research associate they will seek assistance should that change. Patient discharged to lobby where sister was waiting.  Problem: Education: Goal: Ability to state activities that reduce stress will improve 11/02/2020 1147 by Raylene Miyamoto, RN Outcome: Adequate for Discharge 11/02/2020 0849 by Raylene Miyamoto, RN Outcome: Progressing   Problem: Coping: Goal: Ability to identify and develop effective coping behavior will improve 11/02/2020 1147 by Raylene Miyamoto, RN Outcome: Adequate for Discharge 11/02/2020 0849 by Raylene Miyamoto, RN Outcome: Progressing   Problem: Self-Concept: Goal: Ability to identify factors that promote anxiety will improve 11/02/2020 1147 by Raylene Miyamoto, RN Outcome: Adequate for Discharge 11/02/2020 0849 by Raylene Miyamoto, RN Outcome: Progressing Goal: Level of anxiety will decrease Outcome: Adequate for Discharge Goal: Ability to modify response to factors that promote anxiety will improve Outcome: Adequate for Discharge   Problem: Education: Goal: Utilization of techniques to improve thought processes will improve Outcome: Adequate for Discharge Goal: Knowledge of the prescribed therapeutic regimen will improve Outcome: Adequate for Discharge   Problem: Activity: Goal: Interest or engagement in leisure activities will improve Outcome: Adequate for Discharge Goal: Imbalance in normal sleep/wake cycle will improve Outcome: Adequate for Discharge   Problem: Coping: Goal: Coping ability will improve Outcome: Adequate for Discharge Goal: Will verbalize feelings Outcome: Adequate for Discharge   Problem: Health Behavior/Discharge  Planning: Goal: Ability to make decisions will improve Outcome: Adequate for Discharge Goal: Compliance with therapeutic regimen will improve Outcome: Adequate for Discharge   Problem: Role Relationship: Goal: Will demonstrate positive changes in social behaviors and relationships Outcome: Adequate for Discharge   Problem: Safety: Goal: Ability to disclose and discuss suicidal ideas will improve Outcome: Adequate for Discharge Goal: Ability to identify and utilize support systems that promote safety will improve Outcome: Adequate for Discharge   Problem: Self-Concept: Goal: Will verbalize positive feelings about self Outcome: Adequate for Discharge Goal: Level of anxiety will decrease Outcome: Adequate for Discharge   Problem: Education: Goal: Knowledge of [place] [facility] Education information/materials will improve Outcome: Adequate for Discharge Goal: Emotional status will improve Outcome: Adequate for Discharge Goal: Mental status will improve Outcome: Adequate for Discharge Goal: Verbalization of understanding the information provided will improve Outcome: Adequate for Discharge   Problem: Activity: Goal: Interest or engagement in activities will improve Outcome: Adequate for Discharge Goal: Sleeping patterns will improve Outcome: Adequate for Discharge   Problem: Coping: Goal: Ability to verbalize frustrations and anger appropriately will improve Outcome: Adequate for Discharge Goal: Ability to demonstrate self-control will improve Outcome: Adequate for Discharge   Problem: Health Behavior/Discharge Planning: Goal: Identification of resources available to assist in meeting health care needs will improve Outcome: Adequate for Discharge Goal: Compliance with treatment plan for underlying cause of condition will improve Outcome: Adequate for Discharge   Problem: Physical Regulation: Goal: Ability to maintain clinical measurements within normal limits will  improve Outcome: Adequate for Discharge   Problem: Safety: Goal: Periods of time without injury will increase Outcome: Adequate for Discharge   Problem: Activity: Goal: Will identify at least one activity in which they can participate Outcome: Adequate for Discharge   Problem: Coping: Goal: Ability to identify and develop effective coping behavior will improve Outcome: Adequate for Discharge Goal: Ability to interact with others  will improve Outcome: Adequate for Discharge Goal: Demonstration of participation in decision-making regarding own care will improve Outcome: Adequate for Discharge Goal: Ability to use eye contact when communicating with others will improve Outcome: Adequate for Discharge   Problem: Health Behavior/Discharge Planning: Goal: Identification of resources available to assist in meeting health care needs will improve Outcome: Adequate for Discharge   Problem: Self-Concept: Goal: Will verbalize positive feelings about self Outcome: Adequate for Discharge   Problem: Education: Goal: Ability to make informed decisions regarding treatment will improve Outcome: Adequate for Discharge   Problem: Coping: Goal: Coping ability will improve Outcome: Adequate for Discharge   Problem: Health Behavior/Discharge Planning: Goal: Identification of resources available to assist in meeting health care needs will improve Outcome: Adequate for Discharge   Problem: Medication: Goal: Compliance with prescribed medication regimen will improve Outcome: Adequate for Discharge   Problem: Self-Concept: Goal: Ability to disclose and discuss suicidal ideas will improve Outcome: Adequate for Discharge Goal: Will verbalize positive feelings about self Outcome: Adequate for Discharge

## 2020-11-02 NOTE — Telephone Encounter (Signed)
Transition Care Management Follow-up Telephone Call Date of discharge and from where: 11/01/2020 from [facility] How have you been since you were released from the hospital? Pt states that he is feeling well today. Pt did not have any questions or concerns. I confirmed pt's upcoming appt and he requested that his CPE with Dr. Alvy Bimler be rescheduled with Dr. Ashley Royalty for a time in the a.m.  I have canceled the appt with Dr. Alvy Bimler and rescheduled with Dr. Ashley Royalty, however, if I made an error in the appt please contact the patient for a new one. I apologize in advance for any error.  Any questions or concerns? No  Items Reviewed: Did the pt receive and understand the discharge instructions provided? Yes  Medications obtained and verified? Yes  Other? No  Any new allergies since your discharge? No  Dietary orders reviewed? No Do you have support at home? Yes   Functional Questionnaire: (I = Independent and D = Dependent) ADLs: I  Bathing/Dressing- I  Meal Prep- I  Eating- I  Maintaining continence- I  Transferring/Ambulation- I  Managing Meds- I   Follow up appointments reviewed:  PCP Hospital f/u appt confirmed? No   Specialist Hospital f/u appt confirmed? Yes  Scheduled to see Dr. Gilmore Laroche on 11/26/2020 @ 11:00am. Are transportation arrangements needed? No  If their condition worsens, is the pt aware to call PCP or go to the Emergency Dept.? Yes Was the patient provided with contact information for the PCP's office or ED? Yes Was to pt encouraged to call back with questions or concerns? Yes

## 2020-11-02 NOTE — [facility] Suicide Risk Assessment (Signed)
[facility] Discharge Suicide Risk Assessment   Principal Problem: MDD (major depressive disorder), recurrent severe, without psychosis (HCC) Discharge Diagnoses: Principal Problem:   MDD (major depressive disorder), recurrent severe, without psychosis (HCC)   Total Time spent with patient: 20 minutes  Musculoskeletal: Strength & Muscle Tone: within normal limits Gait & Station: normal Patient leans: N/A  Psychiatric Specialty Exam  Presentation  General Appearance:  No data recorded Eye Contact: No data recorded Speech: No data recorded Speech Volume: No data recorded Handedness: No data recorded  Mood and Affect  Mood: No data recorded Duration of Depression Symptoms: No data recorded Affect: No data recorded  Thought Process  Thought Processes: No data recorded Descriptions of Associations:No data recorded Orientation:No data recorded Thought Content:No data recorded History of Schizophrenia/Schizoaffective disorder:No data recorded Duration of Psychotic Symptoms:No data recorded Hallucinations:No data recorded Ideas of Reference:No data recorded Suicidal Thoughts:No data recorded Homicidal Thoughts:No data recorded  Sensorium  Memory: No data recorded Judgment: No data recorded Insight: No data recorded  Executive Functions  Concentration: No data recorded Attention Span: No data recorded Recall: No data recorded Fund of Knowledge: No data recorded Language: No data recorded  Psychomotor Activity  Psychomotor Activity: No data recorded  Assets  Assets: No data recorded  Sleep  Sleep: No data recorded  Physical Exam: Physical Exam Vitals and nursing note reviewed.  Constitutional:      Appearance: Normal appearance.  HENT:     Head: Normocephalic and atraumatic.  Pulmonary:     Effort: Pulmonary effort is normal.  Neurological:     General: No focal deficit present.     Mental Status: He is alert and oriented to person, place, and  time.   Review of Systems  All other systems reviewed and are negative. Blood pressure 125/90, pulse 74, temperature 98.3 F (36.8 C), temperature source Oral, resp. rate 16, height 5\' 9"  (1.753 m), weight 103 kg, SpO2 99 %. Body mass index is 33.52 kg/m.  Mental Status Per Nursing Assessment::   On Admission:  Suicidal ideation indicated by patient, Plan includes specific time, place, or method, Intention to act on suicide plan, Suicidal ideation indicated by others, Belief that plan would result in death, Suicide plan  Demographic Factors:  Male, Caucasian, Gay, lesbian, or bisexual orientation, and Living alone  Loss Factors: NA  Historical Factors: Impulsivity  Risk Reduction Factors:   Employed, Positive social support, and Positive coping skills or problem solving skills  Continued Clinical Symptoms:  Depression:   Impulsivity  Cognitive Features That Contribute To Risk:  None    Suicide Risk:  Minimal: No identifiable suicidal ideation.  Patients presenting with no risk factors but with morbid ruminations; may be classified as minimal risk based on the severity of the depressive symptoms   Follow-up Information     [facility] Follow up.   Contact information: [address] [place] [phone]                 Plan Of Care/Follow-up recommendations:  Activity:  ad lib  [phone], MD 11/02/2020, 9:40 AM

## 2020-11-02 NOTE — Tx Team (Signed)
Interdisciplinary Treatment and Diagnostic Plan Update  11/02/2020 Time of Session: 9:20am Alexander Osborne MRN: [id number]  Principal Diagnosis: MDD (major depressive disorder), recurrent severe, without psychosis (HCC)  Secondary Diagnoses: Principal Problem:   MDD (major depressive disorder), recurrent severe, without psychosis (HCC)   Current Medications:  Current Facility-Administered Medications  Medication Dose Route Frequency Provider Last Rate Last Admin   acetaminophen (TYLENOL) tablet 650 mg  650 mg Oral Q6H PRN Antonieta Pert, MD       albuterol (VENTOLIN HFA) 108 (90 Base) MCG/ACT inhaler 2 puff  2 puff Inhalation Q6H PRN Antonieta Pert, MD       alum & mag hydroxide-simeth (MAALOX/MYLANTA) 200-200-20 MG/5ML suspension 30 mL  30 mL Oral Q4H PRN Antonieta Pert, MD       FLUoxetine (PROZAC) capsule 20 mg  20 mg Oral Daily Antonieta Pert, MD       loratadine (CLARITIN) tablet 10 mg  10 mg Oral Daily Antonieta Pert, MD   10 mg at 11/02/20 0768   magnesium hydroxide (MILK OF MAGNESIA) suspension 30 mL  30 mL Oral Daily PRN Antonieta Pert, MD       multivitamin with minerals tablet 1 tablet  1 tablet Oral Daily Antonieta Pert, MD   1 tablet at 11/02/20 0881   Current Outpatient Medications  Medication Sig Dispense Refill   albuterol (VENTOLIN HFA) 108 (90 Base) MCG/ACT inhaler Inhale 2 puffs into the lungs every 6 (six) hours as needed for wheezing or shortness of breath. 18 g 1   FLUoxetine (PROZAC) 40 MG capsule Take 1 capsule (40 mg total) by mouth daily. 30 capsule 0   levocetirizine (XYZAL) 5 MG tablet Take 5 mg by mouth in the morning.     Multiple Vitamins-Minerals (ONE-A-DAY [employer]ALTH FORMULA) TABS Take 1 tablet by mouth daily with breakfast.     sildenafil (VIAGRA) 50 MG tablet Take 50 mg by mouth daily as needed for erectile dysfunction.     PTA Medications: No medications prior to admission.    Patient Stressors: Financial  difficulties Health problems Occupational concerns  Patient Strengths: Ability for insight Average or above average intelligence Motivation for treatment/growth Physical Health Supportive family/friends Work skills  Treatment Modalities: Medication Management, Group therapy, Case management,  1 to 1 session with clinician, Psychoeducation, Recreational therapy.   Physician Treatment Plan for Primary Diagnosis: MDD (major depressive disorder), recurrent severe, without psychosis (HCC) Long Term Goal(s): Improvement in symptoms so as ready for discharge   Short Term Goals: Ability to maintain clinical measurements within normal limits will improve Compliance with prescribed medications will improve Ability to identify changes in lifestyle to reduce recurrence of condition will improve Ability to disclose and discuss suicidal ideas Ability to demonstrate self-control will improve  Medication Management: Evaluate patient's response, side effects, and tolerance of medication regimen.  Therapeutic Interventions: 1 to 1 sessions, Unit Group sessions and Medication administration.  Evaluation of Outcomes: Adequate for Discharge  Physician Treatment Plan for Secondary Diagnosis: Principal Problem:   MDD (major depressive disorder), recurrent severe, without psychosis (HCC)  Long Term Goal(s): Improvement in symptoms so as ready for discharge   Short Term Goals: Ability to maintain clinical measurements within normal limits will improve Compliance with prescribed medications will improve Ability to identify changes in lifestyle to reduce recurrence of condition will improve Ability to disclose and discuss suicidal ideas Ability to demonstrate self-control will improve     Medication Management: Evaluate patient's response, side  effects, and tolerance of medication regimen.  Therapeutic Interventions: 1 to 1 sessions, Unit Group sessions and Medication administration.  Evaluation of  Outcomes: Adequate for Discharge   RN Treatment Plan for Primary Diagnosis: MDD (major depressive disorder), recurrent severe, without psychosis (HCC) Long Term Goal(s): Knowledge of disease and therapeutic regimen to maintain health will improve  Short Term Goals: Ability to remain free from injury will improve and Ability to verbalize frustration and anger appropriately will improve  Medication Management: RN will administer medications as ordered by provider, will assess and evaluate patient's response and provide education to patient for prescribed medication. RN will report any adverse and/or side effects to prescribing provider.  Therapeutic Interventions: 1 on 1 counseling sessions, Psychoeducation, Medication administration, Evaluate responses to treatment, Monitor vital signs and CBGs as ordered, Perform/monitor CIWA, COWS, AIMS and Fall Risk screenings as ordered, Perform wound care treatments as ordered.  Evaluation of Outcomes: Adequate for Discharge   LCSW Treatment Plan for Primary Diagnosis: MDD (major depressive disorder), recurrent severe, without psychosis (HCC) Long Term Goal(s): Safe transition to appropriate next level of care at discharge, Engage patient in therapeutic group addressing interpersonal concerns.  Short Term Goals: Engage patient in aftercare planning with referrals and resources, Increase social support, and Increase ability to appropriately verbalize feelings  Therapeutic Interventions: Assess for all discharge needs, 1 to 1 time with Social worker, Explore available resources and support systems, Assess for adequacy in community support network, Educate family and significant other(s) on suicide prevention, Complete Psychosocial Assessment, Interpersonal group therapy.  Evaluation of Outcomes: Adequate for Discharge   Progress in Treatment: Attending groups: Yes. Participating in groups: Yes. Taking medication as prescribed: Yes. Toleration  medication: Yes. Family/Significant other contact made: Yes, individual(s) contacted:  family Patient understands diagnosis: Yes. Discussing patient identified problems/goals with staff: Yes. Medical problems stabilized or resolved: Yes. Denies suicidal/homicidal ideation: Yes. Issues/concerns per patient self-inventory: No.   New problem(s) identified: No, Describe:  none  New Short Term/Long Term Goal(s): medication stabilization, elimination of SI thoughts, development of comprehensive mental wellness plan.    Patient Goals:  Did not attend  Discharge Plan or Barriers: Pt is set up with [facility] for therapy and medication management.  Reason for Continuation of Hospitalization: Medication stabilization  Estimated Length of Stay: Adequate for Discharge  Attendees: Patient: Alexander Osborne 11/02/2020 1:33 PM  Physician: Rhea Belton, DO 11/02/2020 1:33 PM  Nursing:  11/02/2020 1:33 PM  RN Care Manager: 11/02/2020 1:33 PM  Social Worker: Ruthann Cancer, LCSW 11/02/2020 1:33 PM  Recreational Therapist:  11/02/2020 1:33 PM  Other:  11/02/2020 1:33 PM  Other:  11/02/2020 1:33 PM  Other: 11/02/2020 1:33 PM    Scribe for Treatment Team: Otelia Santee, LCSW 11/02/2020 1:48 PM

## 2020-11-02 NOTE — Progress Notes (Signed)
Recreation Therapy Notes  Date: 7.15.22 Time: 0930 Location: [address]  Group Topic: Stress Management   Goal Area(s) Addresses:  Patient will actively participate in stress management techniques presented during session.  Patient will successfully identify benefit of practicing stress management post d/c.   Behavioral Response: Appropriate  Intervention: Meditation with ambient sound and script  Activity:  Meditation  LRT explained the stress management technique of meditation to patients. Patients was asked to participate in the technique introduced during session. LRT also debriefed patients on the topic. Patients were given suggestions of where to access meditations post d/c and encouraged to explore Youtube and other apps available on smartphones, tablets, and computers.   Education:  Stress Management, Discharge Planning.   Education Outcome: Acknowledges education  Clinical Observations/Feedback: Patient actively engaged in technique introduced and expressed no concerns.    Caroll Rancher, LRT/CTRS         Lillia Abed, Kimi Bordeau A 11/02/2020 11:01 AM

## 2020-11-20 ENCOUNTER — Encounter: Payer: Self-pay | Admitting: Family Medicine

## 2020-11-26 ENCOUNTER — Telehealth (INDEPENDENT_AMBULATORY_CARE_PROVIDER_SITE_OTHER): Payer: 59 | Admitting: Psychiatry

## 2020-11-26 ENCOUNTER — Encounter (HOSPITAL_COMMUNITY): Payer: Self-pay | Admitting: Psychiatry

## 2020-11-26 DIAGNOSIS — F411 Generalized anxiety disorder: Secondary | ICD-10-CM

## 2020-11-26 DIAGNOSIS — F331 Major depressive disorder, recurrent, moderate: Secondary | ICD-10-CM

## 2020-11-26 DIAGNOSIS — F39 Unspecified mood [affective] disorder: Secondary | ICD-10-CM
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
## 2020-11-27 ENCOUNTER — Encounter: Payer: Self-pay | Admitting: Emergency Medicine

## 2020-11-28 ENCOUNTER — Encounter: Payer: Self-pay | Admitting: Family Medicine

## 2020-11-28 ENCOUNTER — Ambulatory Visit (INDEPENDENT_AMBULATORY_CARE_PROVIDER_SITE_OTHER): Payer: Managed Care, Other (non HMO) | Admitting: Family Medicine

## 2020-11-28 DIAGNOSIS — G5603 Carpal tunnel syndrome, bilateral upper limbs: Secondary | ICD-10-CM

## 2020-11-28 MED ORDER — PREDNISONE 50 MG PO TABS: ORAL_TABLET | ORAL | 0 refills | Status: DC

## 2020-11-28 NOTE — Patient Instructions (Addendum)
Try carpal tunnel wrist brace in the evenings.     Start prednisone 50mg  daily for 5 days

## 2020-11-28 NOTE — Progress Notes (Signed)
Alexander Osborne - 40 y.o. male MRN [id number]  Date of birth: [birth year]  Subjective Chief Complaint  Patient presents with   Hand Pain    HPI Alias is a 40 year old male here today with complaint of bilateral hand pain.  He has had increased pain over the past couple of weeks.  He is also noted some increased weakness with grip.  At his last appointment he had symptoms of numbness and tingling in the hands including the first and second digits.  This is not consistent with carpal tunnel.  He was referred to neurology for nerve conduction testing.  Since that time he feels like symptoms have worsened.  He has tried anti-inflammatories without significant improvement.  ROS:  A comprehensive ROS was completed and negative except as noted per HPI  Allergies  Allergen Reactions   Fish-Derived Products Other (See Comments)    Allergic, but reaction not recalled   Shellfish Allergy Other (See Comments)    Patient does not recall the reaction    Past Medical History:  Diagnosis Date   Allergy    Anxiety    Asthma    Depression    Diabetes mellitus without complication (HCC)     Past Surgical History:  Procedure Laterality Date   corneal transplant left     EYE SURGERY      Social History   Socioeconomic History   Marital status: Legally Separated    Spouse name: Not on file   Number of children: 0   Years of education: Not on file   Highest education level: Not on file  Occupational History   Occupation: Production designer, theatre/television/film    Employer: [employer]  Tobacco Use   Smoking status: Former    Packs/day: 0.25    Years: 11.00    Pack years: 2.75    Types: Cigarettes    Quit date: 10/03/2017    Years since quitting: 3.1   Smokeless tobacco: Never  Vaping Use   Vaping Use: Never used  Substance and Sexual Activity   Alcohol use: Yes    Alcohol/week: 1.0 standard drink    Types: 1 Standard drinks or equivalent per week   Drug use: No   Sexual activity: Yes    Partners: Male     Birth control/protection: None    Comment: SS partner  Other Topics Concern   Not on file  Social History Narrative   Marital status: married x 4 years; happily married.       Lives; with husbnad      Employment: Building control surveyor at [employer]      Tobacco: socially; weekends      Alcohol: 1-3 drinks per week      Drugs: none      Exercise: none   Denies caffeine use    Social Determinants of Corporate investment banker Strain: Not on file  Food Insecurity: Not on file  Transportation Needs: Not on file  Physical Activity: Not on file  Stress: Not on file  Social Connections: Not on file    Family History  Problem Relation Age of Onset   Cancer Mother 19       pancreatic cancer   Hypertension Father    Heart disease Father 19       CAD   Bipolar disorder Brother    Diabetes Sister    Allergic rhinitis Neg Hx    Angioedema Neg Hx    Asthma Neg Hx    Eczema Neg Hx  Immunodeficiency Neg Hx    Urticaria Neg Hx     Health Maintenance  Topic Date Due   Hepatitis C Screening  Never done   OPHTHALMOLOGY EXAM  06/10/2018   FOOT EXAM  08/11/2018   URINE MICROALBUMIN  11/14/2018   Pneumococcal Vaccine 53-78 Years old (2 - PCV) 05/29/2019   COVID-19 Vaccine (4 - Booster for Moderna series) 05/15/2020   INFLUENZA VACCINE  11/19/2020   HEMOGLOBIN A1C  04/19/2021   TETANUS/TDAP  04/30/2027   PNEUMOCOCCAL POLYSACCHARIDE VACCINE AGE 23-64 HIGH RISK  Completed   HIV Screening  Completed   HPV VACCINES  Aged Out     ----------------------------------------------------------------------------------------------------------------------------------------------------------------------------------------------------------------- Physical Exam BP 138/70   Pulse 79   Temp (!) 97.5 F (36.4 C)   Ht 5' 9.5" (1.765 m)   Wt 235 lb 0.6 oz (106.6 kg)   SpO2 98%   BMI 34.21 kg/m   Physical Exam Constitutional:      Appearance: Normal appearance.  HENT:     Head:  Normocephalic and atraumatic.  Eyes:     General: No scleral icterus. Musculoskeletal:     Cervical back: Neck supple.     Comments: No swelling noted along the wrist.  He does have positive Tinel sign at the carpal tunnel.  No significant weakness noted.  He does have increased pain with compression of the median nerve at the carpal tunnel in addition to positive Phalen test.  Neurological:     General: No focal deficit present.     Mental Status: He is alert.  Psychiatric:        Mood and Affect: Mood normal.        Behavior: Behavior normal.    ------------------------------------------------------------------------------------------------------------------------------------------------------------------------------------------------------------------- Assessment and Plan  Bilateral carpal tunnel syndrome We discussed adding wrist braces in the evening.  Starting course of prednisone 50 mg daily x5 days.  If this is not helpful we discussed either getting him set up with Dr. Benjamin Stain to have injections and/or referral to hand specialist consider surgical intervention given worsening symptoms.   Meds ordered this encounter  Medications   predniSONE (DELTASONE) 50 MG tablet    Sig: Take 50mg  daily x5 days    Dispense:  5 tablet    Refill:  0    No follow-ups on file.    This visit occurred during the SARS-CoV-2 public health emergency.  Safety protocols were in place, including screening questions prior to the visit, additional usage of staff PPE, and extensive cleaning of exam room while observing appropriate contact time as indicated for disinfecting solutions.

## 2020-11-28 NOTE — Assessment & Plan Note (Addendum)
We discussed adding wrist braces in the evening.  Starting course of prednisone 50 mg daily x5 days.  If this is not helpful we discussed either getting him set up with Dr. Benjamin Stain to have injections and/or referral to hand specialist consider surgical intervention given worsening symptoms.

## 2020-11-29 ENCOUNTER — Encounter: Payer: Self-pay | Admitting: Family Medicine

## 2020-12-13 ENCOUNTER — Ambulatory Visit (HOSPITAL_COMMUNITY): Payer: 59 | Admitting: Licensed Clinical Social Worker

## 2020-12-26 ENCOUNTER — Encounter: Payer: Managed Care, Other (non HMO) | Admitting: Family Medicine

## 2020-12-26 ENCOUNTER — Telehealth (INDEPENDENT_AMBULATORY_CARE_PROVIDER_SITE_OTHER): Payer: 59 | Admitting: Psychiatry

## 2020-12-26 ENCOUNTER — Encounter (HOSPITAL_COMMUNITY): Payer: Self-pay | Admitting: Psychiatry

## 2020-12-26 ENCOUNTER — Telehealth (HOSPITAL_COMMUNITY): Payer: 59 | Admitting: Psychiatry

## 2020-12-26 DIAGNOSIS — Z0000 Encounter for general adult medical examination without abnormal findings: Secondary | ICD-10-CM

## 2020-12-26 DIAGNOSIS — F39 Unspecified mood [affective] disorder: Secondary | ICD-10-CM | POA: Diagnosis not present

## 2020-12-26 DIAGNOSIS — F331 Major depressive disorder, recurrent, moderate: Secondary | ICD-10-CM | POA: Diagnosis not present

## 2020-12-26 MED ORDER — LAMOTRIGINE 25 MG PO TABS
25.0000 mg | ORAL_TABLET | Freq: Every day | ORAL | 0 refills | Status: DC
Start: 2020-12-26 — End: 2021-01-23
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
## 2021-01-11 ENCOUNTER — Ambulatory Visit (INDEPENDENT_AMBULATORY_CARE_PROVIDER_SITE_OTHER): Payer: 59 | Admitting: Licensed Clinical Social Worker

## 2021-01-11 DIAGNOSIS — F331 Major depressive disorder, recurrent, moderate: Secondary | ICD-10-CM | POA: Diagnosis not present

## 2021-01-11 DIAGNOSIS — F39 Unspecified mood [affective] disorder: Secondary | ICD-10-CM

## 2021-01-11 DIAGNOSIS — Z87828 Personal history of other (healed) physical injury and trauma: Secondary | ICD-10-CM
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
## 2021-01-16 ENCOUNTER — Encounter: Payer: Self-pay | Admitting: Family Medicine

## 2021-01-16 ENCOUNTER — Ambulatory Visit (INDEPENDENT_AMBULATORY_CARE_PROVIDER_SITE_OTHER): Payer: Managed Care, Other (non HMO) | Admitting: Family Medicine

## 2021-01-16 ENCOUNTER — Other Ambulatory Visit: Payer: Self-pay

## 2021-01-16 VITALS — BP 127/73 | HR 91 | Temp 99.2°F | Wt 237.1 lb

## 2021-01-16 DIAGNOSIS — R768 Other specified abnormal immunological findings in serum: Secondary | ICD-10-CM | POA: Diagnosis not present

## 2021-01-16 DIAGNOSIS — Z23 Encounter for immunization: Secondary | ICD-10-CM

## 2021-01-16 NOTE — Progress Notes (Signed)
Alexander Osborne [place] - 40 y.o. male MRN [id number]  Date of birth: 10-07-1980  Subjective Chief Complaint  Patient presents with   Results    HPI Lean is a 40 year old male here today for follow-up of recent lab results.  He states that he had recent STI testing through an outside lab.  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  He has never had anogenital outbreak.  He would like to have repeat testing to be sure this is not a false positive test.  ROS:  A comprehensive ROS was completed and negative except as noted per HPI  Allergies  Allergen Reactions   Fish-Derived Products Other (See Comments)    Allergic, but reaction not recalled   Shellfish Allergy Other (See Comments)    Patient does not recall the reaction    Past Medical History:  Diagnosis Date   Allergy    Anxiety    Asthma    Depression    Diabetes mellitus without complication (HCC)     Past Surgical History:  Procedure Laterality Date   corneal transplant left     EYE SURGERY      Social History   Socioeconomic History   Marital status: Legally Separated    Spouse name: Not on file   Number of children: 0   Years of education: Not on file   Highest education level: Not on file  Occupational History   Occupation: Production designer, theatre/television/film    Employer: [employer]  Tobacco Use   Smoking status: Former    Packs/day: 0.25    Years: 11.00    Pack years: 2.75    Types: Cigarettes    Quit date: 10/03/2017    Years since quitting: 3.2   Smokeless tobacco: Never  Vaping Use   Vaping Use: Never used  Substance and Sexual Activity   Alcohol use: Yes    Alcohol/week: 1.0 standard drink    Types: 1 Standard drinks or equivalent per week   Drug use: No   Sexual activity: Yes    Partners: Male    Birth control/protection: None    Comment: SS partner  Other Topics Concern   Not on file  Social History  Narrative   Marital status: married x 4 years; happily married.       Lives; with husbnad      Employment: Building control surveyor at [employer]      Tobacco: socially; weekends      Alcohol: 1-3 drinks per week      Drugs: none      Exercise: none   Denies caffeine use    Social Determinants of Corporate investment banker Strain: Not on file  Food Insecurity: Not on file  Transportation Needs: Not on file  Physical Activity: Not on file  Stress: Not on file  Social Connections: Not on file    Family History  Problem Relation Age of Onset   Cancer Mother 72       pancreatic cancer   Hypertension Father    Heart disease Father 91       CAD   Bipolar disorder Brother    Diabetes Sister    Allergic rhinitis Neg Hx    Angioedema Neg Hx    Asthma Neg Hx    Eczema Neg Hx    Immunodeficiency Neg Hx    Urticaria Neg Hx     Health  Maintenance  Topic Date Due   Hepatitis C Screening  Never done   OPHTHALMOLOGY EXAM  06/10/2018   FOOT EXAM  08/11/2018   URINE MICROALBUMIN  11/14/2018   COVID-19 Vaccine (4 - Booster for Moderna series) 02/01/2021 (Originally 05/07/2020)   HEMOGLOBIN A1C  04/19/2021   TETANUS/TDAP  04/30/2027   INFLUENZA VACCINE  Completed   HIV Screening  Completed   HPV VACCINES  Aged Out     ----------------------------------------------------------------------------------------------------------------------------------------------------------------------------------------------------------------- Physical Exam BP 127/73 (BP Location: Left Arm, Patient Position: Sitting, Cuff Size: Large)   Pulse 91   Temp 99.2 F (37.3 C) (Oral)   Wt 237 lb 1.9 oz (107.6 kg)   SpO2 98%   BMI 34.51 kg/m   Physical Exam Constitutional:      Appearance: Normal appearance.  HENT:     Head: Normocephalic and atraumatic.  Eyes:     General: No scleral icterus. Cardiovascular:     Rate and Rhythm: Normal rate and regular rhythm.  Pulmonary:     Effort: Pulmonary  effort is normal.     Breath sounds: Normal breath sounds.  Musculoskeletal:     Cervical back: Neck supple.  Neurological:     Mental Status: He is alert.  Psychiatric:        Mood and Affect: Mood normal.        Behavior: Behavior normal.    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  He requests retesting for confirmation.  He has never had an outbreak in the past.  Discussed with him that often positive serology does not equate to active disease.   No orders of the defined types were placed in this encounter.   No follow-ups on file.    This visit occurred during the SARS-CoV-2 public health emergency.  Safety protocols were in place, including screening questions prior to the visit, additional usage of staff PPE, and extensive cleaning of exam room while observing appropriate contact time as indicated for disinfecting solutions.

## 2021-01-16 NOTE — Assessment & Plan Note (Signed)
Recent positive serology for HSV-2.  He requests retesting for confirmation.  He has never had an outbreak in the past.  Discussed with him that often positive serology does not equate to active disease.

## 2021-01-17 LAB — HSV 2 ANTIBODY, IGG: HSV 2 Glycoprotein G Ab, IgG: 3.67 index — ABNORMAL HIGH

## 2021-01-23 ENCOUNTER — Telehealth (INDEPENDENT_AMBULATORY_CARE_PROVIDER_SITE_OTHER): Payer: 59 | Admitting: Psychiatry

## 2021-01-23 ENCOUNTER — Encounter (HOSPITAL_COMMUNITY): Payer: Self-pay | Admitting: Psychiatry

## 2021-01-23 DIAGNOSIS — F39 Unspecified mood [affective] disorder: Secondary | ICD-10-CM

## 2021-01-23 DIAGNOSIS — F331 Major depressive disorder, recurrent, moderate: Secondary | ICD-10-CM | POA: Diagnosis not present

## 2021-01-23 DIAGNOSIS — Z87828 Personal history of other (healed) physical injury and trauma: Secondary | ICD-10-CM | POA: Diagnosis not present

## 2021-01-23 MED ORDER — CITALOPRAM HYDROBROMIDE 10 MG PO TABS: 10.0000 mg | ORAL_TABLET | Freq: Every day | ORAL | 1 refills | Status: DC

## 2021-01-23 MED ORDER — LAMOTRIGINE 25 MG PO TABS: 75.0000 mg | ORAL_TABLET | Freq: Every day | ORAL | 0 refills | Status: DC
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
## 2021-01-29 ENCOUNTER — Telehealth: Payer: Self-pay | Admitting: Family Medicine

## 2021-01-29 ENCOUNTER — Encounter: Payer: Managed Care, Other (non HMO) | Admitting: Family Medicine

## 2021-01-29 NOTE — Telephone Encounter (Signed)
Pt called at 8:28 to let us know he overslept. Thank you.

## 2021-02-08 ENCOUNTER — Other Ambulatory Visit: Payer: Self-pay

## 2021-02-08 ENCOUNTER — Ambulatory Visit (INDEPENDENT_AMBULATORY_CARE_PROVIDER_SITE_OTHER): Payer: 59 | Admitting: Licensed Clinical Social Worker

## 2021-02-08 DIAGNOSIS — F331 Major depressive disorder, recurrent, moderate: Secondary | ICD-10-CM

## 2021-02-08 DIAGNOSIS — F411 Generalized anxiety disorder: Secondary | ICD-10-CM

## 2021-02-08 DIAGNOSIS — F39 Unspecified mood [affective] disorder: Secondary | ICD-10-CM

## 2021-02-08 DIAGNOSIS — Z87828 Personal history of other (healed) physical injury and trauma: Secondary | ICD-10-CM

## 2021-02-08 NOTE — Progress Notes (Signed)
Virtual Visit via Video Note  I connected with Ashaun Gaughan on 02/08/21 at  8:00 AM EDT by a video enabled telemedicine application and verified that I am speaking with the correct person using two identifiers.  Location: Patient: home Provider: home office   I discussed the limitations of evaluation and management by telemedicine and the availability of in person appointments. The patient expressed understanding and agreed to proceed.   I discussed the assessment and treatment plan with the patient. The patient was provided an opportunity to ask questions and all were answered. The patient agreed with the plan and demonstrated an understanding of the instructions.   The patient was advised to call back or seek an in-person evaluation if the symptoms worsen or if the condition fails to improve as anticipated.  I provided 53 minutes of non-face-to-face time during this encounter.  THERAPIST PROGRESS NOTE  Session Time: 8:00 AM to 8:53 AM  Participation Level: Active  Behavioral Response: CasualAlertEuthymic  Type of Therapy: Individual Therapy  Treatment Goals addressed:  Managing stress help with binge eating, controlling moods, better able to communicate, self-esteem, coping Interventions: Solution Focused, Strength-based, Supportive, and Other: strategies for disordered eating  Summary: Jung Yurchak is a 40 y.o. male who presents with doing pretty well the last week. The doctor increased dosage of mood stabilizer notices difference. Stable moods helped him be clearer in thoughts and not unreasonable knows before not reasonable not thinking straight and a lot of emotions. Some days good and some days not doing better. Notice some binge eating not as bad as it was. Patient feels it would be good to read packet on disordered eating to know what to do when feel some kind of way.  Reviewed treatment plan and patient discussed wanting to work on communication and said 1 place  he wants to work on it as it work.  Did have a meeting with management and thinks the meeting helped to lead things in the right direction big part was communicating with another manager in store.  They have very different personalities but at the same time can see they are alike respond situations in the same way. He realizes that if know about himself and what is going on with himself then think she is probably doing the same way. Patient says that if he is acting certain way don't mean he has worked through it and hard to figure out what next.  Sharing this with therapist to give more description to communication issues. More specifically shared his frustration that he is the leader and doesn't know what going on they are going to give time that they dedicate to come together every week, want to provide good example, everyone looking at them.  Patient shared positive coping and transitioning session a song reminded him of ex not divorced, called a support was able to express what feeling. Normally go through it by himself but said let himself do something different. Communicate with people who say care about him. Need to do more with bestfriend and wife know for a long time. Worked in session on binge eating when very emotional do it make himself to feel better, feeling better but long run no not. Helplful to track usually eat and fall asleep. Needs structure in life. A lot of depression separated from spouse a lot has to do with self-esteem, binge eating connected, if address binge eating and self-esteem depression would go away.   Therapist reviewed symptoms, facilitated expression of thoughts  and feelings reviewed treatment plan and patient gave consent to complete virtually.  Assess session very helpful as narrow down focus that will help with symptoms include strengthening self-esteem, working on binge eating.  Noted patient using coping skills and therapist provided positive feedback, reinforcing  continuing this including calling support to share feelings describing verbalizing and labeling accurately feelings helps with coping, why therapy sessions can be helpful for release and insight to feelings.  Also patient already took positive steps with communication which she wants to work on by discussing with management plans to improve this.  Additional insights on patient's part help with communication seeing more ways the person is like him and also understanding better where they are coming from helpful and relationship building and communicating.  Therapist introduced worksheet on binge eating, patient agrees to track so that he better understands thoughts feelings, triggers when he has binge eating and then we can look at helpful treatment interventions.  Also looked at some strategies for it including urge surfing, distraction, urge delay.  Therapist introduced concept she uses for self-esteem that we have unconditional value we do not have to do anything to get it although activities, different pathways can help strengthen her self-esteem.  Therapist provided active listening open questions supportive interventions. Suicidal/Homicidal: No  Plan: Return again in 2 weeks.2.  Patient look at packet on disorderly eating as well as binge eating to discuss at next session. 3.  Therapist work on self-esteem and discussed packet with patient on eating issues  Diagnosis: Axis I:  unspecified mood disorder, major depressive disorder, recurrent, moderate, history of trauma, generalized anxiety disorder    Axis II: No diagnosis    [place]idge Breeze, LCSW 02/08/2021

## 2021-02-11 ENCOUNTER — Telehealth: Payer: Self-pay | Admitting: Neurology

## 2021-02-11 NOTE — Telephone Encounter (Signed)
NP appt changed due to MD out- LVM & sent mychart informing pt.

## 2021-02-14 ENCOUNTER — Ambulatory Visit: Payer: Managed Care, Other (non HMO) | Admitting: Neurology

## 2021-02-15 ENCOUNTER — Encounter: Payer: Self-pay | Admitting: Neurology

## 2021-02-15 ENCOUNTER — Ambulatory Visit (INDEPENDENT_AMBULATORY_CARE_PROVIDER_SITE_OTHER): Payer: Managed Care, Other (non HMO) | Admitting: Neurology

## 2021-02-15 VITALS — BP 128/76 | HR 81 | Ht 69.5 in | Wt 241.0 lb

## 2021-02-15 DIAGNOSIS — M79641 Pain in right hand: Secondary | ICD-10-CM | POA: Diagnosis not present

## 2021-02-15 DIAGNOSIS — R200 Anesthesia of skin: Secondary | ICD-10-CM | POA: Diagnosis not present

## 2021-02-15 DIAGNOSIS — M79642 Pain in left hand: Secondary | ICD-10-CM | POA: Diagnosis not present

## 2021-02-15 DIAGNOSIS — R202 Paresthesia of skin: Secondary | ICD-10-CM

## 2021-02-15 DIAGNOSIS — G5603 Carpal tunnel syndrome, bilateral upper limbs: Secondary | ICD-10-CM | POA: Diagnosis not present

## 2021-02-15 NOTE — Progress Notes (Addendum)
Subjective:    Patient ID: Alexander Osborne is a 40 y.o. male.  HPI    Alexander Foley, MD, PhD [facility] [address] [address] [place], [place] [address]  Dear Dr. Ashley Osborne,   I saw your patient, Alexander Osborne, upon your kind request, in my clinic today for initial consultation of his bilateral hand numbness, concern for carpal tunnel syndrome (correction made on 04/23/2021).  The patient is unaccompanied today.  As you know, Alexander Osborne is a 40 year old clinically gentleman with an underlying medical history of allergies, asthma, anxiety, diabetes, and obesity, who reports numbness and tingling in both hands as well as pain in both hands and the palmar aspect and wrist areas bilaterally for the past several months.  He reports that starting this year his symptoms have become worse and are particularly left-sided.  He has started using a splint but does not always remember using it especially at night.  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  He also has noticed numbness while on driving when he holds onto the steering wheel.  He feels weaker in his grip strength.  He exercises regularly and particularly lifts weights and has used a splint while weightlifting.  He has not quite changed his exercise pattern because of his symptoms.  A few years ago he had radiating pain into the left arm as well as radiating numbness from time to time and was told he had a pinched nerve.   His pain is in the thenar aspect of the hands bilaterally, left more than right, sometimes it is a dull and achy pain and sometimes he has tingling and numbness affecting the first 3 digits of both hands and sometimes the left hand just hurts altogether.  I reviewed your office note from 10/18/2020.    He drinks alcohol up to 3 drinks at a time, about 3 times a week.  He does not drink caffeine per se but does have caffeine  in his preworkout.  He quit smoking several years ago.  He currently lives alone, is separated.  No children.  I had evaluated him several years ago for obstructive sleep apnea. A Home sleep test from 12/04/15 was negative for any significant obstructive sleep apnea.  Overall AHI was 1.2/h, O2 nadir 88%.  Previously:   10/09/2015: 40 year old right-handed gentleman with an underlying medical history of asthma, allergy, and obesity, who reports snoring and excessive daytime somnolence. I reviewed your office note from 09/18/2015. He lives with his spouse, who reports snoring and breathing pauses while he is asleep. His partner/spouse has voiced concern about choking spells or gasping sounds while patient is asleep. Patient denies any restless legs type symptoms but has some difficulty getting comfortable at night and falling asleep. He tends to listen to music on his cell phone at night, sometimes he plays on his cell phone. He does not typically watch TV in bed. There is no pets in the household, no children. He works typically from 10 AM to 7 PM. 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 He drinks alcohol about once a week on average, quit smoking in November 2016, and denies taking any illicit drugs and does not typically consume caffeine on a day-to-day basis. He has no family history of obstructive sleep apnea as far she can tell. He tries to go to bed around 10-11, wakeup time is around 6:30 AM currently. He  tries to exercise at the mornings. He is trying to lose weight. He has had episodes of sleep paralysis, this happens intermittently and has been ongoing for years. There is no family history of narcolepsy, he denies any hypnagogic or hypnopompic hallucinations or cataplexy. He denies any sinister daytime somnolence. He does not always wake up rested, Epworth Sleepiness Scale score is 8 out of 24 today, his fatigue score is 18 out of 63. He denies morning headaches. He has nocturia, usually once per night on  average. He reports reflux symptoms, sometimes at night. Worse when he gained weight. He also reports allergy symptoms and nasal congestion. He is on allergy medication and nasal spray.   His Past Medical History Is Significant For: Past Medical History:  Diagnosis Date   Allergy    Anxiety    Asthma    Depression    Diabetes mellitus without complication (HCC)     His Past Surgical History Is Significant For: Past Surgical History:  Procedure Laterality Date   corneal transplant left     EYE SURGERY      His Family History Is Significant For: Family History  Problem Relation Age of Onset   Cancer Mother 97       pancreatic cancer   Hypertension Father    Heart disease Father 45       CAD   Bipolar disorder Brother    Diabetes Sister    Allergic rhinitis Neg Hx    Angioedema Neg Hx    Asthma Neg Hx    Eczema Neg Hx    Immunodeficiency Neg Hx    Urticaria Neg Hx     His Social History Is Significant For: Social History   Socioeconomic History   Marital status: Legally Separated    Spouse name: Not on file   Number of children: 0   Years of education: Not on file   Highest education level: Not on file  Occupational History   Occupation: Event organiser: Radio producer  Tobacco Use   Smoking status: Former    Packs/day: 0.25    Years: 11.00    Pack years: 2.75    Types: Cigarettes    Quit date: 10/03/2017    Years since quitting: 3.3   Smokeless tobacco: Never  Vaping Use   Vaping Use: Never used  Substance and Sexual Activity   Alcohol use: Yes    Alcohol/week: 1.0 standard drink    Types: 1 Standard drinks or equivalent per week   Drug use: No   Sexual activity: Yes    Partners: Male    Birth control/protection: None    Comment: SS partner  Other Topics Concern   Not on file  Social History Narrative   Marital status: married x 4 years; happily married.       Lives; with husbnad      Employment: Building control surveyor at [employer]       Tobacco: socially; weekends      Alcohol: 1-3 drinks per week      Drugs: none      Exercise: none   Denies caffeine use    Social Determinants of Corporate investment banker Strain: Not on file  Food Insecurity: Not on file  Transportation Needs: Not on file  Physical Activity: Not on file  Stress: Not on file  Social Connections: Not on file    His Allergies Are:  Allergies  Allergen Reactions   Fish-Derived Products Other (See Comments)  Allergic, but reaction not recalled   Shellfish Allergy Other (See Comments)    Patient does not recall the reaction  :   His Current Medications Are:  Outpatient Encounter Medications as of 02/15/2021  Medication Sig   albuterol (VENTOLIN HFA) 108 (90 Base) MCG/ACT inhaler Inhale 2 puffs into the lungs every 6 (six) hours as needed for wheezing or shortness of breath.   citalopram (CELEXA) 10 MG tablet Take 1 tablet (10 mg total) by mouth daily.   lamoTRIgine (LAMICTAL) 25 MG tablet Take 3 tablets (75 mg total) by mouth daily. Take 3 a day   levocetirizine (XYZAL) 5 MG tablet Take 5 mg by mouth in the morning.   Multiple Vitamins-Minerals (ONE-A-DAY MENS HEALTH FORMULA) TABS Take 1 tablet by mouth daily with breakfast.   omeprazole (PRILOSEC) 40 MG capsule Take 1 capsule by mouth daily.   sildenafil (VIAGRA) 50 MG tablet Take 50 mg by mouth daily as needed for erectile dysfunction.   [DISCONTINUED] FLUoxetine (PROZAC) 40 MG capsule Take 1 capsule (40 mg total) by mouth daily.   [DISCONTINUED] predniSONE (DELTASONE) 50 MG tablet Take 50mg  daily x5 days (Patient not taking: Reported on 02/15/2021)   [DISCONTINUED] traZODone (DESYREL) 50 MG tablet Take 50-100 mg by mouth at bedtime as needed.   No facility-administered encounter medications on file as of 02/15/2021.  :   Review of Systems:  Out of a complete 14 point review of systems, all are reviewed and negative with the exception of these symptoms as listed below:   Review of  Systems  Neurological:        Here to discuss worsening bilateral carpel tunnel symptoms in both. Pt reports sx stared a few years ago but have progressed since.    Objective:  Neurological Exam  Physical Exam Physical Examination:   Vitals:   02/15/21 0957  BP: 128/76  Pulse: 81  SpO2: 97%    General Examination: The patient is a very pleasant 40 y.o. male in no acute distress. He appears well-developed and well-nourished and well groomed.   HEENT: Normocephalic, atraumatic, pupils are equal, round and reactive to light and accommodation. Extraocular tracking is good without limitation to gaze excursion or nystagmus noted. Normal smooth pursuit is noted. Hearing is grossly intact with tuning fork exam.  Face is symmetric with normal facial animation and normal facial sensation to light touch, pinprick, vibration and temperature. Speech is clear with no dysarthria noted. There is no hypophonia. There is no lip, neck/head, jaw or voice tremor. Neck is supple with full range of passive and active motion. There are no carotid bruits on auscultation. Oropharynx exam reveals: mild mouth dryness, good dental hygiene, tongue protrudes centrally and palate elevates symmetrically.   Chest: Clear to auscultation without wheezing, rhonchi or crackles noted.   Heart: S1+S2+0, regular and normal without murmurs, rubs or gallops noted.    Abdomen: Soft, non-tender and non-distended.   Extremities: There is no pitting edema in the distal lower extremities bilaterally. Pedal pulses are intact.   Skin: Warm and dry without trophic changes noted.   Musculoskeletal: exam reveals no obvious joint deformities.    Neurologically:  Mental status: The patient is awake, alert and oriented in all 4 spheres. His immediate and remote memory, attention, language skills and fund of knowledge are appropriate. There is no evidence of aphasia, agnosia, apraxia or anomia. Speech is clear with normal prosody and  enunciation. Thought process is linear. Mood is normal and affect is normal.  Cranial nerves  II - XII are as described above under HEENT exam. In addition: shoulder shrug is normal with equal shoulder height noted. Motor exam: Normal bulk, strength and tone is noted, with the exception of perhaps slight grip strength weakness bilaterally considering his strength overall.  He does not have any thenar atrophy.  No fasciculations are noted.  He does have a positive Tinel's on the left and reports aching pain in both palms.  He has a positive Phalen's bilaterally, left more pronounced. There is no drift, tremor or rebound. Romberg is negative. Reflexes are 2+ throughout, toes are downgoing bilaterally. Fine motor skills and coordination: intact with normal finger taps, normal hand movements, normal rapid alternating patting, normal foot taps and normal foot agility.  Cerebellar testing: No dysmetria or intention tremor on finger to nose testing. Heel to shin is unremarkable bilaterally. There is no truncal or gait ataxia.  Sensory exam: intact to light touch, pinprick, vibration, temperature sense in the upper and lower extremities.  Gait, station and balance: He stands easily. No veering to one side is noted. No leaning to one side is noted. Posture is age-appropriate and stance is narrow based. Gait shows normal stride length and normal pace. No problems turning are noted.    Assessment and Plan:    In summary, Alverto Shedd is a very pleasant 40 year old male with an underlying medical history of allergies, asthma, anxiety, diabetes, and obesity, who presents patient with his hand pain and paresthesias of several months duration.  History and examination are concerning for bilateral carpal tunnel syndrome.  He has a splint but has not used it regularly.  He is encouraged to use the splint more consistently and avoid weight lifting or weight training without upper extremities at this time.  Cooling or  using a ice pack wrapped in a towel can help reduce swelling and the pain.  He is advised that we should proceed with an EMG nerve conduction velocity test for both upper extremities to look for median neuropathy at the wrist.  I explained the test to him in detail and advised him that I may suggest a referral to a hand surgeon next, depending on the results.  We will keep him posted as to his EMG/NCV test results by phone call for now and take it from there.  I answered all his questions today and he was in agreement with the plan.  Thank you very much for allowing me to participate in the care of this nice patient. If I can be of any further assistance to you please do not hesitate to call me at [phone].   Sincerely,     Alexander Foley, MD, PhD

## 2021-02-15 NOTE — Patient Instructions (Signed)
We will do an EMG and nerve conduction velocity test, which is an electrical nerve and muscle test, which we will schedule. We will call you with the results.  

## 2021-02-27 ENCOUNTER — Ambulatory Visit (HOSPITAL_COMMUNITY): Payer: 59 | Admitting: Licensed Clinical Social Worker

## 2021-03-01 ENCOUNTER — Telehealth (HOSPITAL_COMMUNITY): Payer: 59 | Admitting: Psychiatry

## 2021-03-04 ENCOUNTER — Encounter: Payer: Managed Care, Other (non HMO) | Admitting: Family Medicine

## 2021-03-04 ENCOUNTER — Ambulatory Visit (INDEPENDENT_AMBULATORY_CARE_PROVIDER_SITE_OTHER): Payer: Managed Care, Other (non HMO) | Admitting: Family Medicine

## 2021-03-04 ENCOUNTER — Encounter: Payer: Self-pay | Admitting: Family Medicine

## 2021-03-04 VITALS — BP 128/74 | HR 72 | Temp 98.1°F | Wt 242.0 lb

## 2021-03-04 DIAGNOSIS — Z13220 Encounter for screening for lipoid disorders: Secondary | ICD-10-CM | POA: Diagnosis not present

## 2021-03-04 DIAGNOSIS — Z8639 Personal history of other endocrine, nutritional and metabolic disease: Secondary | ICD-10-CM

## 2021-03-04 DIAGNOSIS — Z0000 Encounter for general adult medical examination without abnormal findings: Secondary | ICD-10-CM | POA: Diagnosis not present

## 2021-03-04 DIAGNOSIS — Z131 Encounter for screening for diabetes mellitus: Secondary | ICD-10-CM | POA: Diagnosis not present

## 2021-03-04 MED ORDER — ALBUTEROL SULFATE HFA 108 (90 BASE) MCG/ACT IN AERS: 2.0000 | INHALATION_SPRAY | Freq: Four times a day (QID) | RESPIRATORY_TRACT | 1 refills | Status: DC | PRN

## 2021-03-04 NOTE — Progress Notes (Signed)
BP 128/74 (BP Location: Left Arm, Patient Position: Sitting, Cuff Size: Large)   Pulse 72   Temp 98.1 F (36.7 C) (Oral)   Wt 242 lb (109.8 kg)   SpO2 98%   BMI 35.22 kg/m    Subjective:    Patient ID: Alexander Osborne, male    DOB: [birth year], 40 y.o.   MRN: [id number]  HPI: Alexander Osborne is a 40 y.o. male presenting on 03/04/2021 for comprehensive medical examination. Current medical complaints include:none  He currently lives with: alone Interim Problems from his last visit: no  He reports regular vision exams q1-5y: yes He reports regular dental exams q 75m: yes His diet consists of: not too healthy right now, trying to improve He endorses exercise and/or activity of: 5-6 days per week about an hour or two - weight lifting, cardio He works at: IT sales professional   He endorses ETOH use - 3-4 servings per week He denies nictoine use - formerly used cigarettes, quick during pandemic He denies illegal substance use   He is currently sexually active with males, multiple He denies concerns today about STI, uses condoms   He denies concerns about skin changes today:  He denies concerns about bowel changes today:  He denies concerns about bladder changes today:   Depression Screen done today and results listed below:  Depression screen [facility] 2/9 03/04/2021 10/18/2020 05/28/2020 05/15/2020 11/28/2019  Decreased Interest 2 2 1  0 0  Down, Depressed, Hopeless 2 2 2  0 0  PHQ - 2 Score 4 4 3  0 0  Altered sleeping 0 3 1 - -  Tired, decreased energy 0 1 0 - -  Change in appetite 3 3 2  - -  Feeling bad or failure about yourself  1 1 2  - -  Trouble concentrating 0 0 0 - -  Moving slowly or fidgety/restless 0 0 0 - -  Suicidal thoughts 1 1 1  - -  PHQ-9 Score 9 13 9  - -  Difficult doing work/chores Somewhat difficult Somewhat difficult Somewhat difficult - -  Some encounter information is confidential and restricted. Go to Review Flowsheets activity to see all data.   Some recent data might be hidden   Patient reports he has not been able to go to counseling the past two weeks, but plans to resume. Also has an upcoming appointment with psych this month. Does not wish to discuss during physical or make any changes today. Denies suicidal/homicidal ideation.    The patient does not have a history of falls.     Past Medical History:  Past Medical History:  Diagnosis Date   Allergy    Anxiety    Asthma    Depression    Diabetes mellitus without complication (HCC)     Surgical History:  Past Surgical History:  Procedure Laterality Date   corneal transplant left     EYE SURGERY      Medications:  Current Outpatient Medications on File Prior to Visit  Medication Sig   citalopram (CELEXA) 10 MG tablet Take 1 tablet (10 mg total) by mouth daily.   lamoTRIgine (LAMICTAL) 25 MG tablet Take 3 tablets (75 mg total) by mouth daily. Take 3 a day   levocetirizine (XYZAL) 5 MG tablet Take 5 mg by mouth in the morning.   loratadine (CLARITIN) 10 MG tablet Take 10 mg by mouth daily.   Multiple Vitamins-Minerals (ONE-A-DAY MENS HEALTH FORMULA) TABS Take 1 tablet by mouth daily with breakfast.  omeprazole (PRILOSEC) 40 MG capsule Take 1 capsule by mouth daily.   sildenafil (VIAGRA) 50 MG tablet Take 50 mg by mouth daily as needed for erectile dysfunction.   [DISCONTINUED] FLUoxetine (PROZAC) 40 MG capsule Take 1 capsule (40 mg total) by mouth daily.   [DISCONTINUED] traZODone (DESYREL) 50 MG tablet Take 50-100 mg by mouth at bedtime as needed.   No current facility-administered medications on file prior to visit.    Allergies:  Allergies  Allergen Reactions   Fish-Derived Products Other (See Comments)    Allergic, but reaction not recalled   Shellfish Allergy Other (See Comments)    Patient does not recall the reaction    Social History:  Social History   Socioeconomic History   Marital status: Legally Separated    Spouse name: Not on file    Number of children: 0   Years of education: Not on file   Highest education level: Not on file  Occupational History   Occupation: Event organiser: Radio producer  Tobacco Use   Smoking status: Former    Packs/day: 0.25    Years: 11.00    Pack years: 2.75    Types: Cigarettes    Quit date: 10/03/2017    Years since quitting: 3.4   Smokeless tobacco: Never  Vaping Use   Vaping Use: Never used  Substance and Sexual Activity   Alcohol use: Yes    Alcohol/week: 1.0 standard drink    Types: 1 Standard drinks or equivalent per week   Drug use: No   Sexual activity: Yes    Partners: Male    Birth control/protection: None    Comment: SS partner  Other Topics Concern   Not on file  Social History Narrative   Marital status: married x 4 years; happily married.       Lives; with husbnad      Employment: Building control surveyor at [employer]      Tobacco: socially; weekends      Alcohol: 1-3 drinks per week      Drugs: none      Exercise: none   Denies caffeine use    Social Determinants of Corporate investment banker Strain: Not on file  Food Insecurity: Not on file  Transportation Needs: Not on file  Physical Activity: Not on file  Stress: Not on file  Social Connections: Not on file  Intimate Partner Violence: Not on file   Social History   Tobacco Use  Smoking Status Former   Packs/day: 0.25   Years: 11.00   Pack years: 2.75   Types: Cigarettes   Quit date: 10/03/2017   Years since quitting: 3.4  Smokeless Tobacco Never   Social History   Substance and Sexual Activity  Alcohol Use Yes   Alcohol/week: 1.0 standard drink   Types: 1 Standard drinks or equivalent per week    Family History:  Family History  Problem Relation Age of Onset   Cancer Mother 4       pancreatic cancer   Hypertension Father    Heart disease Father 20       CAD   Bipolar disorder Brother    Diabetes Sister    Allergic rhinitis Neg Hx    Angioedema Neg Hx    Asthma Neg Hx     Eczema Neg Hx    Immunodeficiency Neg Hx    Urticaria Neg Hx     Past medical history, surgical history, medications, allergies, family history and social  history reviewed with patient today and changes made to appropriate areas of the chart.   All ROS negative except what is listed above and in the HPI.      Objective:    BP 128/74 (BP Location: Left Arm, Patient Position: Sitting, Cuff Size: Large)   Pulse 72   Temp 98.1 F (36.7 C) (Oral)   Wt 242 lb (109.8 kg)   SpO2 98%   BMI 35.22 kg/m   Wt Readings from Last 3 Encounters:  03/04/21 242 lb (109.8 kg)  02/15/21 241 lb (109.3 kg)  01/16/21 237 lb 1.9 oz (107.6 kg)    Physical Exam Vitals reviewed.  Constitutional:      Appearance: Normal appearance. He is normal weight.  HENT:     Head: Normocephalic and atraumatic.     Right Ear: There is impacted cerumen.     Left Ear: Tympanic membrane normal.     Ears:     Comments: Declined cerumen removal    Nose: Nose normal.     Mouth/Throat:     Mouth: Mucous membranes are moist.     Pharynx: Oropharynx is clear.  Eyes:     Extraocular Movements: Extraocular movements intact.     Conjunctiva/sclera: Conjunctivae normal.     Pupils: Pupils are equal, round, and reactive to light.  Cardiovascular:     Rate and Rhythm: Normal rate and regular rhythm.     Pulses: Normal pulses.     Heart sounds: Normal heart sounds.  Pulmonary:     Effort: Pulmonary effort is normal.     Breath sounds: Normal breath sounds.  Abdominal:     General: Abdomen is flat. Bowel sounds are normal. There is no distension.     Palpations: Abdomen is soft. There is no mass.     Tenderness: There is no abdominal tenderness. There is no right CVA tenderness, left CVA tenderness, guarding or rebound.     Hernia: No hernia is present.  Musculoskeletal:        General: Normal range of motion.     Cervical back: Normal range of motion and neck supple. No tenderness.  Lymphadenopathy:      Cervical: No cervical adenopathy.  Skin:    General: Skin is warm and dry.     Capillary Refill: Capillary refill takes less than 2 seconds.     Findings: No rash.  Neurological:     General: No focal deficit present.     Mental Status: He is alert and oriented to person, place, and time. Mental status is at baseline.     Motor: No weakness.     Coordination: Coordination normal.     Gait: Gait normal.  Psychiatric:        Mood and Affect: Mood normal.        Behavior: Behavior normal.        Thought Content: Thought content normal.        Judgment: Judgment normal.    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      Assessment & Plan:   Problem List Items Addressed This Visit       Other   History of type 2 diabetes mellitus   Relevant Orders   Comprehensive metabolic panel   Lipid panel   Hemoglobin A1c   Other Visit Diagnoses     Annual  physical exam    -  Primary   Relevant Medications   albuterol (VENTOLIN HFA) 108 (90 Base) MCG/ACT inhaler   Other Relevant Orders   CBC   Comprehensive metabolic panel   Lipid panel   Hemoglobin A1c   TSH   Screening for diabetes mellitus       Relevant Orders   Hemoglobin A1c   Screening, lipid       Relevant Orders   Lipid panel        LABORATORY TESTING:  Health maintenance labs ordered today as discussed above.  - STI testing: deferred    IMMUNIZATIONS:   - Tdap: Tetanus vaccination status reviewed: last tetanus booster within 10 years. - Influenza: Up to date - Pneumovax: Not applicable - Prevnar: Not applicable - HPV: Not applicable - Shingrix vaccine: Not applicable - COVID-19: Up to date  SCREENING: - Colonoscopy: Not applicable  Discussed with patient purpose of the colonoscopy is to detect colon cancer at curable precancerous or early stages  - AAA Screening: Not applicable  -Hearing Test:  Not applicable  -Spirometry: Not applicable  - Lung Cancer Screening: Not applicable    PATIENT COUNSELING:    Sexuality: Discussed sexually transmitted diseases, partner selection, use of condoms, avoidance of unintended pregnancy, and contraceptive alternatives.   I discussed with the patient that most people either abstain from alcohol or drink within safe limits (<=14/week and <=4 drinks/occasion for males, <=7/weeks and <= 3 drinks/occasion for females) and that the risk for alcohol disorders and other health effects rises proportionally with the number of drinks per week and how often a drinker exceeds daily limits.  Discussed cessation/primary prevention of drug use and availability of treatment for abuse.   Diet: Encouraged to adjust caloric intake to maintain or achieve ideal body weight, to reduce intake of dietary saturated fat and total fat, to limit sodium intake by avoiding high sodium foods and not adding table salt, and to maintain adequate dietary potassium and calcium preferably from fresh fruits, vegetables, and low-fat dairy products.    Emphasized the importance of regular exercise  Injury prevention: Discussed safety belts, safety helmets, smoke detector, smoking near bedding or upholstery.   Dental health: Discussed importance of regular tooth brushing, flossing, and dental visits.   Follow up plan:  Return if symptoms worsen or fail to improve. Annual physical next year.   Lollie Marrow Reola Calkins, DNP, FNP-C

## 2021-03-05 LAB — CBC
HCT: 45.6 % (ref 38.5–50.0)
Hemoglobin: 15.9 g/dL (ref 13.2–17.1)
MCH: 30.5 pg (ref 27.0–33.0)
MCHC: 34.9 g/dL (ref 32.0–36.0)
MCV: 87.4 fL (ref 80.0–100.0)
MPV: 12.6 fL — ABNORMAL HIGH (ref 7.5–12.5)
Platelets: 233 10*3/uL (ref 140–400)
RBC: 5.22 10*6/uL (ref 4.20–5.80)
RDW: 13.2 % (ref 11.0–15.0)
WBC: 6.1 10*3/uL (ref 3.8–10.8)

## 2021-03-05 LAB — COMPREHENSIVE METABOLIC PANEL
AG Ratio: 1.7 (calc) (ref 1.0–2.5)
ALT: 31 U/L (ref 9–46)
AST: 22 U/L (ref 10–40)
Albumin: 4.8 g/dL (ref 3.6–5.1)
Alkaline phosphatase (APISO): 61 U/L (ref 36–130)
BUN: 20 mg/dL (ref 7–25)
CO2: 27 mmol/L (ref 20–32)
Calcium: 10 mg/dL (ref 8.6–10.3)
Chloride: 102 mmol/L (ref 98–110)
Creat: 0.92 mg/dL (ref 0.60–1.26)
Globulin: 2.8 g/dL (calc) (ref 1.9–3.7)
Glucose, Bld: 95 mg/dL (ref 65–99)
Potassium: 4.5 mmol/L (ref 3.5–5.3)
Sodium: 137 mmol/L (ref 135–146)
Total Bilirubin: 0.7 mg/dL (ref 0.2–1.2)
Total Protein: 7.6 g/dL (ref 6.1–8.1)

## 2021-03-05 LAB — LIPID PANEL
Cholesterol: 178 mg/dL (ref <200)
HDL: 43 mg/dL (ref >=40)
LDL Cholesterol (Calc): 114 mg/dL (calc) — ABNORMAL HIGH
Non-HDL Cholesterol (Calc): 135 mg/dL (calc) — ABNORMAL HIGH (ref <130)
Total CHOL/HDL Ratio: 4.1 (calc) (ref <5.0)
Triglycerides: 104 mg/dL (ref <150)

## 2021-03-05 LAB — HEMOGLOBIN A1C
Hgb A1c MFr Bld: 5.8 % of total Hgb — ABNORMAL HIGH (ref <5.7)
Mean Plasma Glucose: 120 mg/dL
eAG (mmol/L): 6.6 mmol/L

## 2021-03-05 LAB — TSH: TSH: 0.79 mIU/L (ref 0.40–4.50)

## 2021-03-06 ENCOUNTER — Ambulatory Visit (INDEPENDENT_AMBULATORY_CARE_PROVIDER_SITE_OTHER): Payer: 59 | Admitting: Licensed Clinical Social Worker

## 2021-03-06 DIAGNOSIS — F39 Unspecified mood [affective] disorder: Secondary | ICD-10-CM | POA: Diagnosis not present

## 2021-03-06 NOTE — Progress Notes (Signed)
Virtual Visit via Telephone Note  I connected with Alexander Osborne on 03/06/21 at 10:00 AM EST by telephone and verified that I am speaking with the correct person using two identifiers.  Location: Patient: home Provider: home office   I discussed the limitations, risks, security and privacy concerns of performing an evaluation and management service by telephone and the availability of in person appointments. I also discussed with the patient that there may be a patient responsible charge related to this service. The patient expressed understanding and agreed to proceed.   I discussed the assessment and treatment plan with the patient. The patient was provided an opportunity to ask questions and all were answered. The patient agreed with the plan and demonstrated an understanding of the instructions.   The patient was advised to call back or seek an in-person evaluation if the symptoms worsen or if the condition fails to improve as anticipated.  I provided 45 minutes of non-face-to-face time during this encounter.  THERAPIST PROGRESS NOTE  Session Time: 10:00 AM to 10:45 AM  Participation Level: Active  Behavioral Response: CasualAlertEuthymic in general moodiness and irritability  Type of Therapy: Individual Therapy  Treatment Goals addressed:  Managing stress help with binge eating, controlling moods, better able to communicate, self-esteem, coping Interventions: CBT, DBT, Solution Focused, Strength-based, Supportive, and Other: coping  Summary: Alexander Osborne is a 40 y.o. male who presents with middle of the road not great bouts of anger. Realized stopped talking mood stabilizer for 5-6 days started to moody unreasonable and angry. Hard at work tired and then forget to take it. Stop taking it symptoms boyfriend who is a doctor told him that stop taking it symptoms get worse. Moody and irrational with as well. LIght bulb moment that realized haven't taking medication in  awhile.  Discussed recent relationship issues.  Feelings needed to express and didn't feel like expressing.  Expressed some self was anger and upset but calmed down and expressed himself well ended up breaking down and crying. Relief of emotions. Good thing communicating was feeling insecure. He wasn't able to talk a lot with boyfriend he was on vacation made him feel insecure not important. Feel better. Missing him a lot plan to move to [place]. His boyfriend thanked him for expressing himself didn't have that in previous relationship.  Reasonable guy even though can have tunnel vision when he feels attacked and he misinterprets this. Patient says a person can be angry and annoyed in a relationship does not necessarily mean it is an attack on the partner. Feel better. Hopeful now and he was he feeling a little elevated. The fight about communication. Trying to express himself and he went to tunnel vision he wouldn't let him say what he wanted to day. 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 Patient not taking medication and from there got worse and worse. Started with couldn't express himself he wouldn't let him. Worse because everything he said annoyed patient. He was annoyed the entire day. Irritable very quickly, unreasonable and when it is only him can get himself through it. It wasn't until the next day he was calm. Patient told boyfriend said when get like this not listening. [place] said a lot is going on he had break up,  not taking medication, miss boyfriend,  not been very close for two weeks. Bothered him and so explained why bothered him and things came out. Boyfriend acknowledged he had not let him express  himself.  Boyfriend felt with patient's words a personal attack on him which wasn't, something says something negative does not mean that boyfriend is a bad person. Relationships are not always happy go lucky can be annoyed and it is ok.  Patient  planning in the next 2 years to possibly go to [place] he has been a lot of places and first place felt belonged.  Therapist noted a positive sign for relationship making these plans.  In terms of binge eating he is improving on it. Had physical blood work improved motivated him thought back in diabetes, cholesterol a lot better than thought it was going to be. Numbers slightly high. Motivated because of progress. Being around junk food is challenging. Eats his feelings. Feel better then after doesn't make feel better.  Do not bringing into the house and patient says will do door Dash and pay $100 a day. Therapist pointed out that would be motivation and patient says it should be.  What it does for him when he eats unhealthy makes him feel comfortable. Head right can stick to eating healthier. Continues to eat things not good. Need to find a way to mindful of emotions so not get angry and not stress. Find a way to manage distressing feelings.  Therapist started to present information on distress tolerance and patient will read to learn skills to help with emotions..   Therapist reviewed symptoms, facilitated expression of thoughts and feelings as a coping strategy to work through feelings to help cope with them.  Noted patient has been off medications he forgot but has insight from this to know that they are helpful helps to stabilize his mood.  Patient talked about communication problems in his relationship, noted positive of him opening up to express himself making progress on treatment goal of communication.  Noted able to work through the issue with boyfriend was positive therapist shared her impression that they are opening a pathway for good communication based on what patient shared with therapist patient shared he was annoyed and that has to work on that but that his partner has issue with taking things to person only noted he felt better after communicating which again encourages him to do that.  Noted a  good cry is helpful helps break stress in the body and noted patient's reaction indicates this as he felt better after that as well.  Discussed eating issues and reinforced patient doing better with that continue to review strategies and problem solving to find ways to be healthier.  Noted positive positive report from Dr. Help and motivating him also noted doing better as improvement with his mood.  Talked about managing his feelings as he uses food to help numb them learning different strategies besides food therapist introduced distress tolerance learning how to tolerate difficult feelings will introduce different types of strategies patient will read module to learn some skills that will include mindfulness which patient has insight to know will be helpful being able to notice thoughts will be able to do something about them therapist pointed out.  Also introduced slowing down a reactivity then we can have better choices and how we respond, take of breath observe what is going on step back and take perspective and then proceed with what is works.  Slowing the reactivity time helps for a rational brain to be in charge where we make better choices.  Therapist provided active listening open questions supportive interventions.  Therapist talked about thoughts being important what ever happens what  matters is how we react thoughts play big part of that how we reviewed the situation will impact how we react. Suicidal/Homicidal: No  Plan: Return again in 2 weeks.2.  Patient review packet on distress tolerance.  Continue to review information from disordered eating, binge eating look at self-esteem on website, noted patient saying address self-esteem and binge eating in things depression will go away  Diagnosis: Axis I:  unspecified mood disorder, major depressive disorder, recurrent, moderate, history of trauma, generalized anxiety disorder    Axis II: No diagnosis    Coolidge Breeze, LCSW 03/06/2021

## 2021-03-07 ENCOUNTER — Telehealth (INDEPENDENT_AMBULATORY_CARE_PROVIDER_SITE_OTHER): Payer: 59 | Admitting: Psychiatry

## 2021-03-07 ENCOUNTER — Encounter (HOSPITAL_COMMUNITY): Payer: Self-pay | Admitting: Psychiatry

## 2021-03-07 DIAGNOSIS — F39 Unspecified mood [affective] disorder: Secondary | ICD-10-CM | POA: Diagnosis not present

## 2021-03-07 DIAGNOSIS — F411 Generalized anxiety disorder: Secondary | ICD-10-CM | POA: Diagnosis not present

## 2021-03-07 DIAGNOSIS — Z87828 Personal history of other (healed) physical injury and trauma: Secondary | ICD-10-CM

## 2021-03-07 DIAGNOSIS — F331 Major depressive disorder, recurrent, moderate: Secondary | ICD-10-CM | POA: Diagnosis not present

## 2021-03-07 MED ORDER — CITALOPRAM HYDROBROMIDE 10 MG PO TABS: 10.0000 mg | ORAL_TABLET | Freq: Every day | ORAL | 1 refills | Status: DC

## 2021-03-07 MED ORDER — LAMOTRIGINE 25 MG PO TABS: 75.0000 mg | ORAL_TABLET | Freq: Every day | ORAL | 1 refills | Status: DC
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
## 2021-03-20 ENCOUNTER — Ambulatory Visit (INDEPENDENT_AMBULATORY_CARE_PROVIDER_SITE_OTHER): Payer: 59 | Admitting: Licensed Clinical Social Worker

## 2021-03-20 DIAGNOSIS — F331 Major depressive disorder, recurrent, moderate: Secondary | ICD-10-CM | POA: Diagnosis not present

## 2021-03-20 DIAGNOSIS — F39 Unspecified mood [affective] disorder: Secondary | ICD-10-CM | POA: Diagnosis not present

## 2021-03-20 DIAGNOSIS — Z87828 Personal history of other (healed) physical injury and trauma: Secondary | ICD-10-CM | POA: Diagnosis not present

## 2021-03-20 DIAGNOSIS — F411 Generalized anxiety disorder: Secondary | ICD-10-CM

## 2021-03-20 NOTE — Progress Notes (Signed)
Virtual Visit via Video Note  I connected with Alexander Osborne on 03/20/21 at  9:00 AM EST by a video enabled telemedicine application and verified that I am speaking with the correct person using two identifiers.  Location: Patient: home Provider: home office   I discussed the limitations of evaluation and management by telemedicine and the availability of in person appointments. The patient expressed understanding and agreed to proceed.   I discussed the assessment and treatment plan with the patient. The patient was provided an opportunity to ask questions and all were answered. The patient agreed with the plan and demonstrated an understanding of the instructions.   The patient was advised to call back or seek an in-person evaluation if the symptoms worsen or if the condition fails to improve as anticipated.  I provided 45 minutes of non-face-to-face time during this encounter.  THERAPIST PROGRESS NOTE  Session Time: 9:00 AM to 9:45 AM  Participation Level: Active  Behavioral Response: CasualAlertappropriate.  Mood issues related to relationship issues  Type of Therapy: Individual Therapy  Treatment Goals addressed:  Managing stress help with binge eating, controlling moods, better able to communicate, self-esteem, coping Interventions: Solution Focused, Strength-based, Supportive, and Other: relationship issues, self-esteem, coping  Summary: Alexander Osborne is a 40 y.o. male who presents with mood has been good. Talked about his relationships in a polyamorous relationship one is a couple and live in [place] and one lives here and has a partner. Figures will be a little jealous miss them, one here has a partner and finds himself jealous.  Therapist pointed out that these are some of the dynamics in these types of relationships. Patient says likes the attention. Little bit down when they are going to go out. Wanting to be with there realizing he is a bit more unreasonable when  they go out wants to be there puts him in a mood and then don't want to interact with anyone but also wants to be out with them. Causes problems in mood. Miss them and want to be with them. They have suggested he get somebody else. Knows he is being unreasonable and when in that mood hard to get out of it. Always been like that that it is hard to get out of mood.  Address problem solving with patient related to this issue.  Patient relates they are working on being together.  He says should focus on being in the moment but hard.  At the same time he likes the relationships.  He says lives by himself wants attention during the evening. One suggestion was to find a person that both like.  Seeing wanting more their attention more clinging to it. Wanting more. They have encouraged him to focus on himself and find things to be occupied. Fine until they say they are going out to do what they are doing. Can't help feeling the way feel, happy for them that they are going out but want to be there. Patient says hard but worth it.  Patient shared more about dynamics of relationship.  The couple in [place] one of the guys doesn't want to get married and patient does want that relationship.  Patient notices can be hard but worth it attached to these relationships.  Shared dealing with his own separation and process of divorce. Compared to last relationship more freedom and more communication. Because more free makes him more clinging. Says works because each complete a part of him. Explored jealous issues not that more of  want more and want to be there. Not jealous they are spending the time together. Natural to be jealous but have to communicate to talk about them. Human and a little jealous but the communication helps with that and not be stuck in jealousy.  Explored ways he could have more on his own and patient said hard but requires planning.  Therapist suggested working on living fully in his life and patient also sees time  for him to work himself. Needs to do more work a lot more work on appreciating himself and not depending on others for that. Needs to be with friends more, develop things in his life more. Hard to have a social life with his schedule but do more planning for that.  Notices self-care as part of self-esteem and using time constructively when driving talks on the phone uses time socially, listens to music, and his own thoughts and therapist provided positive feedback for this.  Session patient agrees time for him to focus more on himself do things for himself do things he wants to do.  Explored with him what he wants to do and says not sure and has to think about.    Therapist reviewed symptoms, facilitated expression of thoughts and feelings utilizing this as treatment intervention to cope with feelings by processing them through.  By releasing so they do not become overwhelming by saying out loud so patient able to get a sense of what he is feeling to put things in perspective.  Noted to happy in his relationships but at the same time finds wanting more but looking at pros and cons would not change anything except perhaps to have a partner where we could spend more time with.  Because the confines of relationships are now that people in the polyamorous relationship have partners but patient at this point does not.  Patient noted working towards getting needs met in these relationships so eventually more what he wants.  Noted focusing on himself at this time where he has more freedom having the opportunity to enjoy himself and develop himself will look back at this time and feel good about the things he is done.  Therapist introduced Worksheet "8 steps to improving self-esteem" introducing core concepts therapist uses for self-esteem that everybody having unconditional worth not contingent on outside things but built from the inside.  Looked at patient comparing himself falls into this so needing to be worked on,  again looking for internal sources of worth.  Work on developing himself and feeling good about different aspects of himself.  Noting all of [place] have strengths and weaknesses and continue to work on both the strengths and weaknesses.  And bringing back mindfulness.  As stated in worksheet we can change something if we do not recognize that there is something to change.  By simply becoming aware of her negative self talk we begin to distance her self on the feelings it brings on.  This enables [place] to identify with them less.  Without this awareness we can easily fall into the trap to be believing are self-limiting talk.  Do not believe everything you think.  Thoughts are just that thoughts.  As soon as you find herself going down the path of self criticism, gently note what is happening be curious about it remind yourself these are thoughts not facts.  Again using that curiosity and mindfulness to detach from unhelpful thoughts.  Reviewed session for patient to find focus and patient sees guidance in focusing  on himself and working on himself.  Therapist provided active listening open questions, supportive interventions.  Suicidal/Homicidal: No  Plan: Return again in 1 week.2.  Keep in mind patient feels for just self-esteem and eating issues depression will go away.  Reviewed distress tolerance, look at other emotional regulation strategies so patient has other resources besides using food to comfort him. 3.  Continue to look at worksheet on self-esteem, explore more about mindfulness  Diagnosis: Axis I: unspecified mood disorder, major depressive disorder, recurrent, moderate, history of trauma, generalized anxiety disorder    Axis II: No diagnosis    Cordella Register, LCSW 03/20/2021

## 2021-03-29 ENCOUNTER — Ambulatory Visit (HOSPITAL_COMMUNITY): Payer: 59 | Admitting: Licensed Clinical Social Worker

## 2021-03-29 NOTE — Progress Notes (Signed)
Therapist contacted patient for session and he did not respond session is a no show.

## 2021-04-05 ENCOUNTER — Ambulatory Visit (INDEPENDENT_AMBULATORY_CARE_PROVIDER_SITE_OTHER): Payer: 59 | Admitting: Licensed Clinical Social Worker

## 2021-04-05 DIAGNOSIS — F39 Unspecified mood [affective] disorder: Secondary | ICD-10-CM

## 2021-04-05 DIAGNOSIS — Z87828 Personal history of other (healed) physical injury and trauma: Secondary | ICD-10-CM | POA: Diagnosis not present

## 2021-04-05 DIAGNOSIS — F411 Generalized anxiety disorder: Secondary | ICD-10-CM

## 2021-04-05 DIAGNOSIS — F331 Major depressive disorder, recurrent, moderate: Secondary | ICD-10-CM | POA: Diagnosis not present

## 2021-04-05 NOTE — Progress Notes (Signed)
Virtual Visit via Telephone Note  I connected with Alexander Osborne on 04/05/21 at  9:00 AM EST by telephone and verified that I am speaking with the correct person using two identifiers.  Location: Patient: home Provider: home office   I discussed the limitations, risks, security and privacy concerns of performing an evaluation and management service by telephone and the availability of in person appointments. I also discussed with the patient that there may be a patient responsible charge related to this service. The patient expressed understanding and agreed to proceed.   I discussed the assessment and treatment plan with the patient. The patient was provided an opportunity to ask questions and all were answered. The patient agreed with the plan and demonstrated an understanding of the instructions.   The patient was advised to call back or seek an in-person evaluation if the symptoms worsen or if the condition fails to improve as anticipated.  I provided 54 minutes of non-face-to-face time during this encounter.  THERAPIST PROGRESS NOTE  Session Time: 9:00 AM to 9:54 AM  Participation Level: Active  Behavioral Response: CasualAlertEuthymic  Type of Therapy: Individual Therapy  Treatment Goals addressed:  Managing stress help with binge eating, controlling moods, better able to communicate, self-esteem, coping Interventions: Solution Focused, Strength-based, Supportive, and Other: Self-esteem, coping  Summary: Alexander Osborne is a 40 y.o. male who presents with feels calmer more in control more reasonable. Doing great. Feels good but needs to not lose sight of his objectives even when feeling great. Reviewed focus on himself that we talked about last session. No plans for holidays just  work, his job is retail so work only Secretary/administrator. No plans half fine half not fine. For him holidays don't care because usually don't have to have holidays to spend time with anyone, give a gift. With  is relationships doing it all year long celebrating, be with people not just focus on a day. Only focus on birthdays special. Since separated first holiday haven't had somebody. "I hope it is not going to fly in my face."  Therapist shared her perspective of liking the fact that celebrating all year so appreciating life and not letting it pass but also encouraged a backup plan just in case for the holiday. He has a sister and niece. Explored separation a struggle all year long. Even boyfriends know about it. Recently saw him when he was going on trip and had to get his dog because going on trip. Felt some kind of way. "Got nice". Guess wanted to have sex. Got there felt very vulnerable he said we need to talk about stuff started to talk about stuff going around circles with same problem we have. At that point year coming up for separation hadn't decided about divorce but now decided yes get a divorce. Work somewhat on himself this year found himself with more independence not depend on others for personal happiness. Made friends. Realize that if go back to that how his ex wants to live life and not the same way he wants to live life. Nothing changed but I have changed. Communication one of weakness. Started feeling like he got everything he wanted started feeling left out on his stuff. Didn't want too much. Things fell apart really want to have children and didn't feel he wanted to have children. Decided this may not happen. Fine with that. Morning with himself. Maybe we can do more because felt like live 40 year old couple. Try to ask him can  I go out myself he didn't like that. Argued about that for a long time. Gave up on that. Went on a cruise met a couple of girlfriends on cruise first friends in a long time. Fine until pandemic happened and became depressed. Cracks already there. Not working and stuck at home had a toll on him. Icing on cake was he started school what he really wanted tried to do too much and  got the best of him. He had a Conservation officer, nature. Going into therapy the first time helped to look at what he wants to do with life working on himself. Going back to same argument two years prior with ex patient wanting to do more with life therapy helped him to figure out what he wants to do. Want to do more. Didn't get anything out of it when asked for more. Was being reasonable. Asked for an open relationship. Thought it was a way to negotiate and compromise on what he wanted by ex took that badly and said get out. Were not communicating didn't talk about. Didn't want to talk about. "[name]" got everything he wanted and patient didn't get anything of what he wanted. No compromise.  Talked about how partners do things on their own. He wouldn't take that stuck his way. A cultural thing more than controlling, narcissistic patient thinks.  Thinks what the issue was that when when met him not completely happy with himself still not himself let himself attach myself to him went along with a lot of things fine with a few things that did get saw a future with them. Didn't realize how important kids were to him. Not knowing and being satisfied with who he was. Patient though was the person who grew apart and changed. Mistake not confident and getting needs through someone else. When saw him nothing changed give up more than him not a good compromiser learned needs to take care of himself, had met people and staying wiht him would be giving up more. Alexander Osborne first gay friend. First true friend in over a decade. Very refreshing. Best friend have children. Doesn't remind him he doesn't have kids can go out and do stuff. Partners are so supportive. If don't see eye to eye to try to reach middle ground different from [place]. They want patient to be happy. Those are the people he needs. Moments talk it out and try hard to understand each other. Was emotionally uncontrolled can be unreasonable. Medication and talking to therapist  helping. This year has been tumultuous but also a year grown the most has had mistakes issues grown the most in a long time. Had to get through what he did to realize things about himself, confidence live and take more risks. Coming up on year of separation.  December together eight years. Bought a house together. Sore point didn't want. Wanted to travel. First big argument. Working on having kids it didn't seem he was interested. Wasn't going to do all on himself. Giving up things and then patient felt owed things. Hate feeling owed and not going to make relationship work.  Noted positive changes in growth and self-esteem not afraid, not run away from challenges or people.    Therapist reviewed symptoms, facilitate expression of thoughts and feeling and assess patient making progress in growth and self-esteem.  Patient himself identifies this therapist feels she is encouraging his support for the growth that she sees with patient.  Assessed feelings related to relationship with husband who is separated  from noted patient on feeling that he was sacrificing what he wanted and therapist also feedback was saying the same thing one of the things with growth of self-esteem is you are more assertive about getting your needs met, puts you in a happier position and also important for relationships to get your needs met.  Therapist provided her perspective that the way the relationship was set up was more according to what his partner wanting a relationship and dynamics of partnership look different for patient such as doing more, having kids and identifying that is helpful for patient so that he can look for that in his relationships.  Noted another positive development is a friendship with a gay person and therapist very much force this is a positive enriching his life and suggesting a network is a good solid foundation that also offers enriching relationships in many different ways.  Noted partners as part of this  network and seeing these relationships as positive in many ways wanting him to be happy, good communication, talking about this gives him insight to these relationships as being good and supportive aspects of his life.  We will continue to work with patient on emotional regulation, also helping him out more insight about himself to be able to denies when he is moody and unreasonable so does not have an negative impact on his own mood or his relationships.  Therapist provided active listening open questions, supportive interventions. Suicidal/Homicidal: No  Plan: Return again in 1 week.2.  Assess patient's feelings and issues, work on self-esteem, use sessions to work on emotional regulation healthy ways to manage emotion look at for example distress tolerance CCI, trauma book emotional regulation, look at resources for self-esteem such as finishing self-esteem worksheet, look at mindfulness  Diagnosis: Axis I: unspecified mood disorder, major depressive disorder, recurrent, moderate, history of trauma, generalized anxiety disorder    Axis II: No diagnosis    Cordella Register, LCSW 04/05/2021

## 2021-04-11 ENCOUNTER — Ambulatory Visit (INDEPENDENT_AMBULATORY_CARE_PROVIDER_SITE_OTHER): Payer: 59 | Admitting: Licensed Clinical Social Worker

## 2021-04-11 DIAGNOSIS — F39 Unspecified mood [affective] disorder: Secondary | ICD-10-CM

## 2021-04-11 DIAGNOSIS — F411 Generalized anxiety disorder: Secondary | ICD-10-CM

## 2021-04-11 DIAGNOSIS — Z87828 Personal history of other (healed) physical injury and trauma: Secondary | ICD-10-CM | POA: Diagnosis not present

## 2021-04-11 DIAGNOSIS — F331 Major depressive disorder, recurrent, moderate: Secondary | ICD-10-CM | POA: Diagnosis not present

## 2021-04-11 NOTE — Progress Notes (Addendum)
Virtual Visit via Telephone Note  I connected with Alexander Osborne on 04/11/21 at  9:00 AM EST by telephone and verified that I am speaking with the correct person using two identifiers.  Location: Patient: home Provider: home office   I discussed the limitations, risks, security and privacy concerns of performing an evaluation and management service by telephone and the availability of in person appointments. I also discussed with the patient that there may be a patient responsible charge related to this service. The patient expressed understanding and agreed to proceed.  I discussed the assessment and treatment plan with the patient. The patient was provided an opportunity to ask questions and all were answered. The patient agreed with the plan and demonstrated an understanding of the instructions.   The patient was advised to call back or seek an in-person evaluation if the symptoms worsen or if the condition fails to improve as anticipated.  I provided 50 minutes of non-face-to-face time during this encounter.  THERAPIST PROGRESS NOTE  Session Time: 9:00 AM to 9:50 AM  Participation Level: Active  Behavioral Response: CasualAlertappropriate  Type of Therapy: Individual Therapy  Treatment Goals addressed:  Managing stress help with binge eating, controlling moods, better able to communicate, self-esteem, coping Interventions: Solution Focused, Strength-based, Supportive, and Other: Communication, self-esteem, coping  Summary: Alexander Osborne is a 40 y.o. male who presents with maybe sister may want to spend time with him for Christmas. Maria in 40's, she is married husband in another county. Alexander Osborne patient is half [place] half [place]. Not sure what going to do if alone or with people. Tired has been working hard. Working around the holidays is exhausting. Going better with Scientist, forensic. District leader brought them together. Things pretty good with  mental health. Moment this week with boyfriend in [place]. He decided to take profession pictures, he sent them and patient felt he didn't need to take them. Also tasteful nudes didn't necessarily care for that. Whole day not talking and he said that all his friends support him besides patient, disappointed in patient. Apparently his boyfriend joked and didn't like it either. Boyfriend said took for himself and patient said didn't agree. Didn't communicate well. Didn't explain why he felt like that. Felt not part of the process. Open relationship and do what you want. Fine but technically boyfriend something more special and wants to be part of the process. Likes can do what wants wish come to him first thinking about doing it will do what he want part of the process. Where not communicate tend to not communicate and shut down and don't want to talk to you. Wonders if moody because holidays. Maybe why not clear message not conveyed because moody. Explored why moody could be because had someone close to him. It is different. Still dealing with being alone by himself. That is something that is pretty hard. Not single but no one around him for things like this won't have people around him. One boyfriend sees every day in the gym. Don't see him in the evening or weekends. Last time saw boyfriends from [place] in September. Plans for next year.  Feels self-esteem like mood fine with it and next time not. Self-acceptance and then next time critical. Note it is roller coaster. Process when moved on own hated and so depressed. Now come home and glad to be home. Have had low self-esteem most of his life. Hard to pinpoint underlying causes. While coming to like being in  own space does find on weekends antsy and empty.  Therapist pointed out times in life that are just not exciting patient says feels like may not be exciting but why not overall satisfaction, enjoying life. Therapist encouraged patient with plans to help patient  said he is terrible making plans still assess helpful for him to do this.  Reviewed session and patient relates working on self-acceptance and making progress and patient said yes that is a big thing.     Therapist reviewed symptoms, facilitated expression of thoughts and feelings and worked on communication issue for patient noted he did get to a place of being able to explain himself better which helped communication and that may give him some insight in his communication.  Therapist noted were not always in a place because of his  emotions to communicate and to recognize that and give himself space.  Therapy session to help with self-awareness as patient realizes part of the issue may be moodiness around the holidays.  Assesses helpful for patient to be self-aware and working on himself noted this as something a lot of people feel around the holidays.  Talked about used to being in a relationship and not having someone close is different may be a part of this but also noted patient's growth and being more comfortable by himself still working on it. Therapist read passages from "stop overthinking" to continue to provide information that we will help patient work on self-esteem.  Passage talked about self-care: The method of acceptance.  Accepting your self-the truth is if you want to improve your self esteem and emotional wellness you need to honestly explore all parts of your self that you have not come to terms with or fully excepted.  It is only when you stop being harshly critical of yourself that you can develop a positive sense of who you are.  This explains why self-esteem actually goes up as soon as you become self accepting which is crucial to your emotional wellness and overall happiness.  Noted ways to help accept yourself include self-regulation which is a more positive internal dialogue, self-awareness and noted therapy helpful for that and self transcendence which is connecting to things outside  yourself actually helps increase dopamine and serotonin.  Noted for patient it fluctuates between good self-esteem and negative self-esteem something to continue to work on. Problem solved around specific stressors like being on his own and weekend and therapist encouraged plans Suicidal/Homicidal: No  Plan: Return again in 3 weeks.2.  Look at distress tolerance other information on self-esteem such as from anxiety book, trauma book emotional regulation, finish self-esteem worksheet, mindfulness  Diagnosis: Axis I: unspecified mood disorder, major depressive disorder, recurrent, moderate, history of trauma, generalized anxiety disorder    Axis II: No diagnosis    Coolidge Breeze, LCSW 04/11/2021

## 2021-04-12 ENCOUNTER — Other Ambulatory Visit (HOSPITAL_COMMUNITY): Payer: Self-pay | Admitting: Psychiatry

## 2021-04-17 ENCOUNTER — Ambulatory Visit (INDEPENDENT_AMBULATORY_CARE_PROVIDER_SITE_OTHER): Payer: Managed Care, Other (non HMO) | Admitting: Neurology

## 2021-04-17 ENCOUNTER — Encounter (INDEPENDENT_AMBULATORY_CARE_PROVIDER_SITE_OTHER): Payer: Managed Care, Other (non HMO) | Admitting: Neurology

## 2021-04-17 DIAGNOSIS — M79641 Pain in right hand: Secondary | ICD-10-CM | POA: Diagnosis not present

## 2021-04-17 DIAGNOSIS — M79642 Pain in left hand: Secondary | ICD-10-CM

## 2021-04-17 DIAGNOSIS — Z0289 Encounter for other administrative examinations: Secondary | ICD-10-CM

## 2021-04-17 DIAGNOSIS — R202 Paresthesia of skin: Secondary | ICD-10-CM

## 2021-04-17 DIAGNOSIS — G5603 Carpal tunnel syndrome, bilateral upper limbs: Secondary | ICD-10-CM

## 2021-04-17 NOTE — Procedures (Signed)
Full Name: Alexander Osborne Gender: Male MRN #: [id number] Date of Birth: June 01, 1980    Visit Date: 04/17/2021 07:21 Age: 40 Years Examining Physician: Levert Feinstein, MD  Referring Physician: [name], MD History: 40 year old male, workouts regularly mostly heavy weightlifting, presented with left more than right hand paresthesia, mainly involving the lateral 4 fingers  Summary of the test: Nerve conduction study: Right median sensory response showed moderately prolonged peak latency with severely decreased snap amplitude. Left median sensory response was absent. Left ulnar sensory and motor responses were normal.  Bilateral median motor responses showed moderately prolonged distal latency, left worse than right, with well-preserved CMAP amplitude, conduction velocity.  Electromyography: Selected needle examination of bilateral upper extremity muscles was performed.  The only abnormality is mildly decreased recruitment patterns at bilateral abductor pollicis brevis, there was no evidence of spontaneous activity.  Conclusion: This is an abnormal study.  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  There is no evidence of axonal loss.    ------------------------------- Levert Feinstein M.D. PhD  [facility] [address] [place], [place] [address] Tel: [phone] Fax: [phone]  Verbal informed consent was obtained from the patient, patient was informed of potential risk of procedure, including bruising, bleeding, hematoma formation, infection, muscle weakness, muscle pain, numbness, among others.        MNC    Nerve / Sites Muscle Latency Ref. Amplitude Ref. Rel Amp Segments Distance Velocity Ref. Area    ms ms mV mV %  cm m/s m/s mVms  L Median - APB     Wrist APB 7.4 ?4.4 6.6 ?4.0 100 Wrist - APB 7   31.3      Upper arm APB 11.7  6.0  90.9 Upper arm - Wrist 21 49 ?49 28.3  R Median - APB     Wrist APB 6.7 ?4.4 6.6 ?4.0 100 Wrist - APB 7   27.1     Upper arm APB 11.2  5.7  86.1 Upper arm - Wrist 22 49 ?49 21.8  L Ulnar - ADM     Wrist ADM 2.7 ?3.3 10.0 ?6.0 100 Wrist - ADM 7   32.0     B.Elbow ADM 5.8  9.8  98.1 B.Elbow - Wrist 19 61 ?49 33.3     A.Elbow ADM 7.6  9.8  100 A.Elbow - B.Elbow 10 57 ?49 34.0           SNC    Nerve / Sites Rec. Site Peak Lat Ref.  Amp Ref. Segments Distance    ms ms V V  cm  L Median - Orthodromic (Dig II, Mid palm)     Dig II Wrist NR ?3.4 NR ?10 Dig II - Wrist 13  R Median - Orthodromic (Dig II, Mid palm)     Dig II Wrist 5.2 ?3.4 2 ?10 Dig II - Wrist 13  L Ulnar - Orthodromic, (Dig V, Mid palm)     Dig V Wrist 2.5 ?3.1 5 ?5 Dig V - Wrist 85           F  Wave    Nerve F Lat Ref.   ms ms  L Ulnar - ADM 27.4 ?32.0       EMG Summary Table    Spontaneous MUAP Recruitment  Muscle IA Fib PSW Fasc Other Amp Dur. Poly Pattern  R. Abductor pollicis  brevis Normal None None None _______ Normal Normal Normal Reduced  R. Pronator teres Normal None None None _______ Normal Normal Normal Normal  R. Biceps brachii Normal None None None _______ Normal Normal Normal Normal  R. Deltoid Normal None None None _______ Normal Normal Normal Normal  R. Triceps brachii Normal None None None _______ Normal Normal Normal Normal  R. Brachioradialis Normal None None None _______ Normal Normal Normal Normal  L. Abductor pollicis brevis Normal None None None _______ Normal Normal Normal Reduced  L. Pronator teres Normal None None None _______ Normal Normal Normal Normal  L. Brachioradialis Normal None None None _______ Normal Normal Normal Normal  L. Biceps brachii Normal None None None _______ Normal Normal Normal Normal  L. Deltoid Normal None None None _______ Normal Normal Normal Normal  L. Triceps brachii Normal None None None _______ Normal Normal Normal Normal

## 2021-04-22 ENCOUNTER — Emergency Department (HOSPITAL_COMMUNITY): Payer: Managed Care, Other (non HMO)

## 2021-04-22 ENCOUNTER — Other Ambulatory Visit: Payer: Self-pay

## 2021-04-22 ENCOUNTER — Encounter (HOSPITAL_COMMUNITY): Payer: Self-pay

## 2021-04-22 ENCOUNTER — Emergency Department (HOSPITAL_COMMUNITY)
Admission: EM | Admit: 2021-04-22 | Discharge: 2021-04-22 | Disposition: A | Discharge status: Home / Self Care | Payer: Managed Care, Other (non HMO) | Source: Home / Self Care

## 2021-04-22 ENCOUNTER — Emergency Department (HOSPITAL_COMMUNITY)
Admission: EM | Admit: 2021-04-22 | Discharge: 2021-04-22 | Disposition: A | Discharge status: Home / Self Care | Payer: Managed Care, Other (non HMO) | Attending: Emergency Medicine | Admitting: Emergency Medicine

## 2021-04-22 DIAGNOSIS — R0789 Other chest pain: Secondary | ICD-10-CM | POA: Insufficient documentation

## 2021-04-22 DIAGNOSIS — E119 Type 2 diabetes mellitus without complications: Secondary | ICD-10-CM | POA: Diagnosis not present

## 2021-04-22 DIAGNOSIS — J4541 Moderate persistent asthma with (acute) exacerbation: Secondary | ICD-10-CM | POA: Insufficient documentation

## 2021-04-22 DIAGNOSIS — Z20822 Contact with and (suspected) exposure to covid-19: Secondary | ICD-10-CM | POA: Insufficient documentation

## 2021-04-22 DIAGNOSIS — R062 Wheezing: Secondary | ICD-10-CM | POA: Diagnosis present

## 2021-04-22 DIAGNOSIS — R42 Dizziness and giddiness: Secondary | ICD-10-CM | POA: Insufficient documentation

## 2021-04-22 DIAGNOSIS — Z5321 Procedure and treatment not carried out due to patient leaving prior to being seen by health care provider: Secondary | ICD-10-CM | POA: Insufficient documentation

## 2021-04-22 DIAGNOSIS — J45901 Unspecified asthma with (acute) exacerbation: Secondary | ICD-10-CM | POA: Insufficient documentation

## 2021-04-22 LAB — BASIC METABOLIC PANEL
Anion gap: 8 (ref 5–15)
BUN: 15 mg/dL (ref 6–20)
CO2: 22 mmol/L (ref 22–32)
Calcium: 9.4 mg/dL (ref 8.9–10.3)
Chloride: 105 mmol/L (ref 98–111)
Creatinine, Ser: 0.98 mg/dL (ref 0.61–1.24)
GFR, Estimated: >60 mL/min (ref >60)
Glucose, Bld: 356 mg/dL — ABNORMAL HIGH (ref 70–99)
Potassium: 4.5 mmol/L (ref 3.5–5.1)
Sodium: 135 mmol/L (ref 135–145)

## 2021-04-22 LAB — CBC WITH DIFFERENTIAL/PLATELET
Abs Immature Granulocytes: 0.02 10*3/uL (ref 0.00–0.07)
Basophils Absolute: 0 10*3/uL (ref 0.0–0.1)
Basophils Relative: 0 %
Eosinophils Absolute: 0 10*3/uL (ref 0.0–0.5)
Eosinophils Relative: 0 %
HCT: 44.5 % (ref 39.0–52.0)
Hemoglobin: 15.9 g/dL (ref 13.0–17.0)
Immature Granulocytes: 0 %
Lymphocytes Relative: 10 %
Lymphs Abs: 0.7 10*3/uL (ref 0.7–4.0)
MCH: 30.8 pg (ref 26.0–34.0)
MCHC: 35.7 g/dL (ref 30.0–36.0)
MCV: 86.1 fL (ref 80.0–100.0)
Monocytes Absolute: 0.2 10*3/uL (ref 0.1–1.0)
Monocytes Relative: 3 %
Neutro Abs: 6.1 10*3/uL (ref 1.7–7.7)
Neutrophils Relative %: 87 %
Platelets: 233 10*3/uL (ref 150–400)
RBC: 5.17 MIL/uL (ref 4.22–5.81)
RDW: 13.5 % (ref 11.5–15.5)
WBC: 7 10*3/uL (ref 4.0–10.5)
nRBC: 0 % (ref 0.0–0.2)

## 2021-04-22 LAB — TROPONIN I (HIGH SENSITIVITY): Troponin I (High Sensitivity): <2 ng/L (ref <18)

## 2021-04-22 LAB — RESP PANEL BY RT-PCR (FLU A&B, COVID) ARPGX2
Influenza A by PCR: NEGATIVE
Influenza B by PCR: NEGATIVE
SARS Coronavirus 2 by RT PCR: NEGATIVE

## 2021-04-22 MED ORDER — ALBUTEROL SULFATE (2.5 MG/3ML) 0.083% IN NEBU
5.0000 mg | INHALATION_SOLUTION | Freq: Once | RESPIRATORY_TRACT | Status: AC
  Administered 2021-04-22: 5 mg via RESPIRATORY_TRACT
  Filled 2021-04-22: qty 6

## 2021-04-22 MED ORDER — AEROCHAMBER PLUS FLO-VU MEDIUM MISC
1.0000 | Freq: Once | Status: AC
  Administered 2021-04-22: 1
  Filled 2021-04-22: qty 1

## 2021-04-22 MED ORDER — PREDNISONE 10 MG PO TABS: 20.0000 mg | ORAL_TABLET | Freq: Every day | ORAL | 0 refills | Status: DC

## 2021-04-22 MED ORDER — PREDNISONE 20 MG PO TABS
60.0000 mg | ORAL_TABLET | Freq: Once | ORAL | Status: AC
  Administered 2021-04-22: 60 mg via ORAL
  Filled 2021-04-22: qty 3

## 2021-04-22 MED ORDER — ALBUTEROL SULFATE HFA 108 (90 BASE) MCG/ACT IN AERS
2.0000 | INHALATION_SPRAY | RESPIRATORY_TRACT | Status: DC | PRN
  Administered 2021-04-22: 2 via RESPIRATORY_TRACT
  Filled 2021-04-22 (×2): qty 6.7

## 2021-04-22 NOTE — Discharge Instructions (Signed)
Your asthma appears to have been flared up.  There is not appear to be any other acute infection.  You are being placed on prednisone.  You are given a spacer for your inhaler.  You please use the inhaler and 2 puffs up to every 2-3 hours.  If you are having any worsening symptoms please return for reevaluation.  Otherwise please recheck with your primary care doctor this week.  Your blood pressure has been elevated here and this is not unusual given the acute setting.  However this should be rechecked when you are feeling better in outpatient setting.

## 2021-04-22 NOTE — ED Triage Notes (Signed)
Pt was seen here earlier for asthma exacerbation. Pt reports going home and feeling better, but reports his chest tightness came back and he is now experiencing some dizziness.

## 2021-04-22 NOTE — ED Provider Notes (Signed)
Emergency Medicine Provider Triage Evaluation Note  Alexander Osborne , a 41 y.o. male  was evaluated in triage.  Pt complains of wheezing  Patient states he has had increased use of inhaler for one week, then worsened loc about 9 pm and has now had to use at least 6 times sinec.  Review of Systems  Positive: Cough, mucous Negative: Fever, sore throat chills  Physical Exam  BP (!) 146/92 (BP Location: Left Arm)    Pulse 94    Temp 97.6 F (36.4 C) (Oral)    Resp (!) 22    Ht 1.753 m (5\' 9" )    Wt 112.5 kg    SpO2 90%    BMI 36.62 kg/m  Gen:   Awake, no distress   Resp:  Normal effort poor air movement with expiratory wheezes MSK:   Moves extremities without difficulty  Other:    Medical Decision Making  Medically screening exam initiated at 7:27 AM.  Appropriate orders placed.  Alexander Osborne was informed that the remainder of the evaluation will be completed by another provider, this initial triage assessment does not replace that evaluation, and the importance of remaining in the ED until their evaluation is complete.     Pattricia Boss, MD 04/22/21 [phone]

## 2021-04-22 NOTE — ED Provider Notes (Signed)
Emergency Medicine Provider Triage Evaluation Note  Alexander Osborne , a 41 y.o. male  was evaluated in triage.  Pt complains of shortness of breath, chest tightness, and cough.  Patient seen earlier today for an asthma exacerbation.  Chest x-ray negative for signs of pneumonia. COVID/influenza negative. Patient endorsed improvement in symptoms after nebulizer treatment and steroids however, symptoms returned with exertion after he was discharged. No previous intubations or ICU admissions. No history of blood clots.   Review of Systems  Positive: SOB, chest tightness Negative: Lower extremity edema  Physical Exam  BP (!) 180/103 (BP Location: Left Arm)    Pulse (!) 122    Temp 98.9 F (37.2 C) (Oral)    Resp 18    SpO2 98%  Gen:   Awake, no distress   Resp:  Normal effort, decreased air movement MSK:   Moves extremities without difficulty  Other:  tachycardic  Medical Decision Making  Medically screening exam initiated at 3:41 PM.  Appropriate orders placed.  Alexander Osborne was informed that the remainder of the evaluation will be completed by another provider, this initial triage assessment does not replace that evaluation, and the importance of remaining in the ED until their evaluation is complete.  CXR negative for pneumonia COVID/Influenza negative Will add labs and troponin EKG obtained   Jesusita Oka 04/22/21 1545    Charlynne Pander, MD 04/22/21 2155

## 2021-04-22 NOTE — ED Triage Notes (Signed)
Patient arrived with complaints of an asthma attack, states he has used his inhaler with no relief.

## 2021-04-22 NOTE — ED Provider Notes (Signed)
[facility]-EMERGENCY DEPT Provider Note   CSN: [id number] Arrival date & time: 04/22/21  9357     History  Chief Complaint  Patient presents with   Asthma    Alexander Osborne is a 41 y.o. male.  HPI89 year-old male history of type 2 diabetes, asthma, presents today complaining of wheezing over the past week Discussed with increased use of his inhaler.  He has also had to increase his use of inhaler quite a bit during the past 12 hours.  He has had some nonproductive cough.  Cough is productive of mucus.  He has not had any fever, sore throat, chills, nasal congestion.  He feels that this started when he was washing his dog and became worse because of that.  He has been on steroids in the past but denies any previous hospitalizations or ICU admissions, or intubations for his asthma.  He is not a smoker.    Home Medications Prior to Admission medications   Medication Sig Start Date End Date Taking? Authorizing Provider  predniSONE (DELTASONE) 10 MG tablet Take 2 tablets (20 mg total) by mouth daily. 04/22/21  Yes Margarita Grizzle, MD  albuterol (VENTOLIN HFA) 108 (90 Base) MCG/ACT inhaler Inhale 2 puffs into the lungs every 6 (six) hours as needed for wheezing or shortness of breath. 03/04/21   Clayborne Dana, NP  citalopram (CELEXA) 10 MG tablet TAKE 1 TABLET(10 MG) BY MOUTH DAILY 04/16/21   Thresa Ross, MD  lamoTRIgine (LAMICTAL) 25 MG tablet Take 3 tablets (75 mg total) by mouth daily. Take 3 a day 03/07/21   Thresa Ross, MD  levocetirizine (XYZAL) 5 MG tablet Take 5 mg by mouth in the morning.    [provider]  loratadine (CLARITIN) 10 MG tablet Take 10 mg by mouth daily.    [provider]  Multiple Vitamins-Minerals (ONE-A-DAY MENS HEALTH FORMULA) TABS Take 1 tablet by mouth daily with breakfast.    [provider]  omeprazole (PRILOSEC) 40 MG capsule Take 1 capsule by mouth daily. 12/04/20   [provider]  sildenafil  (VIAGRA) 50 MG tablet Take 50 mg by mouth daily as needed for erectile dysfunction.    [provider]  FLUoxetine (PROZAC) 40 MG capsule Take 1 capsule (40 mg total) by mouth daily. 11/02/20 11/26/20  Antonieta Pert, MD  traZODone (DESYREL) 50 MG tablet Take 50-100 mg by mouth at bedtime as needed. 11/21/20 01/23/21  [provider]      Allergies    Fish-derived products and Shellfish allergy    Review of Systems   Review of Systems  All other systems reviewed and are negative.  Physical Exam Updated Vital Signs BP (!) 145/79    Pulse 79    Temp 97.6 F (36.4 C) (Oral)    Resp 15    Ht 1.753 m (5\' 9" )    Wt 112.5 kg    SpO2 98%    BMI 36.62 kg/m  Physical Exam Vitals and nursing note reviewed.  Constitutional:      General: He is not in acute distress.    Appearance: Normal appearance. He is not ill-appearing.  HENT:     Head: Normocephalic.     Right Ear: External ear normal.     Left Ear: External ear normal.     Nose: Nose normal.     Mouth/Throat:     Pharynx: Oropharynx is clear.  Eyes:     Pupils: Pupils are equal, round, and  reactive to light.  Cardiovascular:     Rate and Rhythm: Normal rate and regular rhythm.  Pulmonary:     Comments: Increased respiratory rate with some increased work of breathing Decreased breath sounds throughout with expiratory wheezes bilateral bases Abdominal:     General: Abdomen is flat.     Palpations: Abdomen is soft.  Musculoskeletal:        General: Normal range of motion.     Cervical back: Normal range of motion.  Skin:    General: Skin is warm.     Capillary Refill: Capillary refill takes less than 2 seconds.  Neurological:     General: No focal deficit present.     Mental Status: He is alert.  Psychiatric:        Mood and Affect: Mood normal.    ED Results / Procedures / Treatments   Labs (all labs ordered are listed, but only abnormal results are displayed) Labs Reviewed  RESP PANEL BY RT-PCR (FLU  A&B, COVID) ARPGX2    EKG None  Radiology DG Chest Port 1 View  Result Date: 04/22/2021 CLINICAL DATA:  Cough, shortness of breath EXAM: PORTABLE CHEST 1 VIEW COMPARISON:  09/15/2015 FINDINGS: The heart size and mediastinal contours are within normal limits. Both lungs are clear. The visualized skeletal structures are unremarkable. IMPRESSION: No active disease. Electronically Signed   By: Ernie Avena M.D.   On: 04/22/2021 08:08    Procedures Procedures  Patient received 5 mL grams albuterol neb and prednisone by mouth  Medications Ordered in ED Medications  albuterol (VENTOLIN HFA) 108 (90 Base) MCG/ACT inhaler 2 puff (2 puffs Inhalation Given 04/22/21 0701)  AeroChamber Plus Flo-Vu Medium MISC 1 each (has no administration in time range)  albuterol (PROVENTIL) (2.5 MG/3ML) 0.083% nebulizer solution 5 mg (5 mg Nebulization Given 04/22/21 0752)  predniSONE (DELTASONE) tablet 60 mg (60 mg Oral Given 04/22/21 0749)  albuterol (PROVENTIL) (2.5 MG/3ML) 0.083% nebulizer solution 5 mg (5 mg Nebulization Given 04/22/21 3875)    ED Course/ Medical Decision Making/ A&P Clinical Course as of 04/22/21 1023  Mon Apr 22, 2021  6433 Patient states that he feels somewhat improved Patient has increased air movement but continues to have some rhonchi and expiratory wheezes He has received prednisone and had 1 5 mg nebulizer Plan to repeat 5 mg nebulizer at this time [DR]  0843 Chest x-Briunna Leicht personally reviewed no evidence of acute abnormality noted Radiology interpretation concurs [DR]  0955 COVID and flu negative Chest x-Eulalio Reamy personally interpreted no acute abnormality Radiology interpretation reviewed and concurs [DR]  1013 Patient improved with increased air movement and decreased work of breathing.  He feels that he is much better at this point. [DR]    Clinical Course User Index [DR] Margarita Grizzle, MD                           Medical Decision Making  This patient presents to the ED for  concern of asthma and wheezing, this involves an extensive number of treatment options, and is a complaint that carries with it a high risk of complications and morbidity.  The differential diagnosis includes acute exacerbation of asthma, other sources of wheezing including volume overload, other acute infection, status asthmaticus, other uri/bronchitis   Co morbidities that complicate the patient evaluation  Ho dm   Additional history obtained:  Additional history obtained from patient  External records from outside source obtained and reviewed including none  Lab Tests:  I Ordered, and personally interpreted labs.  The pertinent results include: flu and covid negative   Imaging Studies ordered:  I ordered imaging studies including cxr I independently visualized and interpreted imaging which showed negative I agree with the radiologist interpretation   Cardiac Monitoring:  The patient was maintained on a cardiac monitor.  I personally viewed and interpreted the cardiac monitored which showed an underlying rhythm of: tachycardia, nsr   Medicines ordered and prescription drug management:  I ordered medication including albuterol, prednisone  for wheezing  Reevaluation of the patient after these medicines showed that the patient improved I have reviewed the patients home medicines and have made adjustments as needed-additional inhaler give, spacer   Test Considered:  Other labs, ekg- patient appears improved and unlikely other tests would yield clinically significant results   Critical Interventions:  Albuterol and steroid   Consultations Obtained:  I requested consultation with the aspiratory therapy for recent protocol and repeat lab  Problem List / ED Course:  asthma   Reevaluation:  After the interventions noted above, I reevaluated the patient and found that they have :improved   Social Determinants of Health:  Patient without reports of high risk  behaviors and appears to have good access to care   Dispostion:  After consideration of the diagnostic results and the patients response to treatment, I feel that the patent would benefit from spacer for his albuterol, course of steroids, we discussed need for close follow-up and return precautions and patient voiced understanding..   Final Clinical Impression(s) / ED Diagnoses Final diagnoses:  Moderate persistent asthma with exacerbation    Rx / DC Orders ED Discharge Orders          Ordered    predniSONE (DELTASONE) 10 MG tablet  Daily        04/22/21 1021              Margarita Grizzleay, Demorris Choyce, MD 04/22/21 1023

## 2021-04-22 NOTE — ED Notes (Signed)
Pt advises he feels much better breathing following 2nd nebulizer application.

## 2021-04-23 ENCOUNTER — Telehealth: Payer: Self-pay | Admitting: General Practice

## 2021-04-23 ENCOUNTER — Telehealth: Payer: Self-pay | Admitting: *Deleted

## 2021-04-23 NOTE — Telephone Encounter (Signed)
-----   Message from [name], MD sent at 04/23/2021  9:00 AM EST ----- Please advise patient that his recent electrical nerve and muscle test through our office (EMG/NCV) indicated carpal tunnel syndrome as suspected. He has moderate findings on both sides.  I would recommend a referral to a hand surgeon.  If he is agreeable, please refer to hand surgery, with indication of bilateral carpal tunnel syndrome. In the meantime, as discussed, he can continue with the supportive measures we have discussed during our appointment with using a splint especially overnight.

## 2021-04-23 NOTE — Telephone Encounter (Signed)
Transition Care Management Unsuccessful Follow-up Telephone Call ° °Date of discharge and from where:  04/22/21 from [facility] ° °Attempts:  1st Attempt ° °Reason for unsuccessful TCM follow-up call:  Left voice message ° °  °

## 2021-04-23 NOTE — Telephone Encounter (Signed)
I called the pt and LVM (ok per DPR) advising pt I was calling about his EMG/NCV results which he has viewed online. Dr Frances Furbish asking if ok to send a referral to a hand surgeon for bilateral carpal tunnel syndrome. In the meantime pt can use supportive measures as discussed at OV including a splint especially at night. I asked the pt to call us back. Left office number in message. Also sent mychart message he can respond to instead.

## 2021-04-24 NOTE — Telephone Encounter (Signed)
Transition Care Management Unsuccessful Follow-up Telephone Call  Date of discharge and from where:  04/22/21 from [facility]  Attempts:  2nd Attempt  Reason for unsuccessful TCM follow-up call:  Left voice message

## 2021-04-24 NOTE — Telephone Encounter (Signed)
Noted, sounds good, I agree with his position.  He can follow-up as needed.

## 2021-04-26 NOTE — Telephone Encounter (Signed)
Transition Care Management Unsuccessful Follow-up Telephone Call  Date of discharge and from where:  04/22/21 from [facility]  Attempts:  3rd Attempt  Reason for unsuccessful TCM follow-up call:  Left voice message

## 2021-04-30 ENCOUNTER — Ambulatory Visit (INDEPENDENT_AMBULATORY_CARE_PROVIDER_SITE_OTHER): Payer: Managed Care, Other (non HMO) | Admitting: Family Medicine

## 2021-04-30 ENCOUNTER — Encounter: Payer: Self-pay | Admitting: Family Medicine

## 2021-04-30 ENCOUNTER — Other Ambulatory Visit: Payer: Self-pay

## 2021-04-30 VITALS — BP 138/80 | HR 92 | Ht 69.0 in | Wt 245.2 lb

## 2021-04-30 DIAGNOSIS — Z13220 Encounter for screening for lipoid disorders: Secondary | ICD-10-CM | POA: Diagnosis not present

## 2021-04-30 DIAGNOSIS — R5383 Other fatigue: Secondary | ICD-10-CM

## 2021-04-30 DIAGNOSIS — R635 Abnormal weight gain: Secondary | ICD-10-CM

## 2021-04-30 DIAGNOSIS — J4540 Moderate persistent asthma, uncomplicated: Secondary | ICD-10-CM

## 2021-04-30 DIAGNOSIS — J029 Acute pharyngitis, unspecified: Secondary | ICD-10-CM | POA: Diagnosis not present

## 2021-04-30 DIAGNOSIS — R7303 Prediabetes: Secondary | ICD-10-CM

## 2021-04-30 DIAGNOSIS — J020 Streptococcal pharyngitis: Secondary | ICD-10-CM

## 2021-04-30 LAB — POCT RAPID STREP A (OFFICE): Rapid Strep A Screen: POSITIVE — AB

## 2021-04-30 MED ORDER — MONTELUKAST SODIUM 10 MG PO TABS: 10.0000 mg | ORAL_TABLET | Freq: Every day | ORAL | 3 refills | Status: DC

## 2021-04-30 MED ORDER — AMOXICILLIN 500 MG PO CAPS: 500.0000 mg | ORAL_CAPSULE | Freq: Two times a day (BID) | ORAL | 0 refills | Status: AC

## 2021-04-30 MED ORDER — ALBUTEROL SULFATE HFA 108 (90 BASE) MCG/ACT IN AERS: 2.0000 | INHALATION_SPRAY | Freq: Four times a day (QID) | RESPIRATORY_TRACT | 0 refills | Status: DC | PRN

## 2021-04-30 NOTE — Patient Instructions (Signed)

## 2021-05-02 ENCOUNTER — Ambulatory Visit (INDEPENDENT_AMBULATORY_CARE_PROVIDER_SITE_OTHER): Payer: 59 | Admitting: Licensed Clinical Social Worker

## 2021-05-02 DIAGNOSIS — F331 Major depressive disorder, recurrent, moderate: Secondary | ICD-10-CM | POA: Diagnosis not present

## 2021-05-02 DIAGNOSIS — F411 Generalized anxiety disorder: Secondary | ICD-10-CM

## 2021-05-02 DIAGNOSIS — F39 Unspecified mood [affective] disorder: Secondary | ICD-10-CM | POA: Diagnosis not present

## 2021-05-02 DIAGNOSIS — Z87828 Personal history of other (healed) physical injury and trauma: Secondary | ICD-10-CM

## 2021-05-02 NOTE — Progress Notes (Addendum)
Virtual Visit via Video Note  I connected with Alexander Osborne on 05/02/21 at  9:00 AM EST by a video enabled telemedicine application and verified that I am speaking with the correct person using two identifiers.  Location: Patient: home Provider: home office   I discussed the limitations of evaluation and management by telemedicine and the availability of in person appointments. The patient expressed understanding and agreed to proceed.   I discussed the assessment and treatment plan with the patient. The patient was provided an opportunity to ask questions and all were answered. The patient agreed with the plan and demonstrated an understanding of the instructions.   The patient was advised to call back or seek an in-person evaluation if the symptoms worsen or if the condition fails to improve as anticipated.  I provided 45 minutes of non-face-to-face time during this encounter.  THERAPIST PROGRESS NOTE  Session Time: 10:00 AM to 10:45 AM  Participation Level: Active  Behavioral Response: CasualAlertEuthymic worked on relationship stressors  Type of Therapy: Individual Therapy  Treatment Goals addressed:  Managing stress help with binge eating, controlling moods, better able to communicate, self-esteem, coping Interventions: Solution Focused, Strength-based, Supportive, Reframing, and Other: relationship skills  Summary: [name] is a 41 y.o. male who presents with we have talked about how there is good communication but lately not great communication with partners. When he feels not being heard gets louder and louder as gets frustrated and irritated because feel not being understood. Say yelling but feels passionate about the subject. Patient says he feels he puts effort in that doesn't get reciprocated trying to understand his partner frustrates him not getting the same in return. Could be related to bad communication with ex, with Alexander Osborne, got more aggressive for him  to understand him. [name] didn't understand he grew up in perfect environment good support from parents. He doesn't have to struggle hard time to understand other people who are struggling how things could effect people. Had to fight with him get to point where loud. Has to remind himself that Alexander Osborne not Alexander Osborne he tries to understand patient not perfect hard headed individuals. Used to shut down and not communicate. Feels he needs his space and won't give it to him upset communication breaks down get hard headed not reasonable. When not upset able to communicate. Give and take feels give, give give and they take no understanding never agree to disagree always your way. He realizes she becomes heated more easily not going to let anybody run him over. Not run over more of a mechanism, a reflux therapist said like a defense mechanism, helpful to look at the facts and what is really going on and if he is being run over not helpful when heated to see things as clearly. Alexander Osborne points out another barrier is cultural barrier his first language is not [place] and therapist said very good point. Patient says he wants to have somebody to share everything with and not hold back. That is what he wants too. That is how they started this. He hasn't been able to share with his boyfriend and couldn't share everything with Alexander Osborne want a relationship don't hold back be themselves and share everything. Really connected because both want that and not getting it from everyone else.  Therapist noted very positive aspects of this relationship and sharing everything help in guiding him in communication.  Therapist noted how much each cares if they want to know everything.  Reviewed seems to be  going in a positive direction when they are in a see each other patient would like to see him in the spring they do have things they need to accomplish before they get together.  Therapist reminded patient of other issue with his communication of his  moodiness to be more aware is that interferes with communication as well.      Work with patient on communications strategies looking at worksheet fair fighting rules.Noted following these guidelines will help with communication and help the fight not get so escalated. For guidelines before you begin ask yourself why you feel upset.  Discussed 1 topic at a time, no degrading language express his feelings with words, take turns speaking noting patient has to work on not interrupting and making corrections.  No stone walling, no yelling.  Noted nothing good happens when voice starts to escalate people were not listening and getting more entrenched in their own position.  Take a time out of things get to heated.  Attempt to come to compromise in understanding.  Therapist emphasizing there is not always a perfect answer to an argument.  Life is too messy for that to your best to come to a compromise if you cannot come to a compromise simply taking the time to understand your partners perspective can help soothe negative feelings.  Therapist applied that to patient's fight noting these arguments can be messy and communication is hard can be misunderstandings.  Did automatic reaction that comes to play for patient from past relationship is he keeps giving and giving in the other person takes.  Note that is an automatic reaction for patient to slow himself down and check the facts out to see if it is the case and even if he feels being to giving have a more effective way of communicating those feelings.  Used reframing to note at the same time seems to be relationship that is growing strengthening as they talk each day and both care and want to know about other people in their lives.  Patient mentions wants to be able to share everything therapist encouraged him to think about what he wants so that he can also give that to think about his choices and thinking how he wants a relationship where he can share everything.   Therapist provided active listening open questions, supportive interventions.  Noted progress patient is making eye actively working on his communication skills. Suicidal/Homicidal: No  Plan: Return again in 2 weeks.2.  Work on relationship issues, mood regulation, can look at distress tolerance sheet, finished worksheet on self-esteem, look at trauma book for emotional regulation, self-esteem anxiety book  Diagnosis: Axis I: unspecified mood disorder, major depressive disorder, recurrent, moderate, history of trauma, generalized anxiety disorder    Axis II: No diagnosis    Coolidge Breeze, LCSW 05/02/2021

## 2021-05-05 DIAGNOSIS — J4540 Moderate persistent asthma, uncomplicated: Secondary | ICD-10-CM | POA: Insufficient documentation

## 2021-05-05 DIAGNOSIS — J020 Streptococcal pharyngitis: Secondary | ICD-10-CM | POA: Insufficient documentation

## 2021-05-05 DIAGNOSIS — R635 Abnormal weight gain: Secondary | ICD-10-CM | POA: Insufficient documentation

## 2021-05-05 NOTE — Assessment & Plan Note (Signed)
Working with [employer] management physician.  Ordering requested labs today.

## 2021-05-05 NOTE — Progress Notes (Signed)
Alexander Osborne - 41 y.o. male MRN [id number]  Date of birth: [birth year]  Subjective Chief Complaint  Patient presents with   Sore Throat   Weight Loss   Asthma    HPI Alexander Osborne is a 41 year old male here today for hospital follow-up.  Recently seen in the emergency department for asthma exacerbation.  He received albuterol neb as well as prednisone in the ED. he feels much better at this point.  He has had more frequent symptoms and feels like he may need to be on a medication to help control his asthma.  He does feel like exposure to pet dander may be causing some of his symptoms.  Also has complaint of sore throat today.  Feels similar to when he had strep throat before.  There is also swelling of the back of his throat.  He denies fever or chills.  He is working with a another physician for [employer].  He has several labs that he needs to have completed and like to have this drawn today.  ROS:  A comprehensive ROS was completed and negative except as noted per HPI  Allergies  Allergen Reactions   Fish-Derived Products Other (See Comments)    Allergic, but reaction not recalled   Shellfish Allergy Other (See Comments)    Patient does not recall the reaction    Past Medical History:  Diagnosis Date   Allergy    Anxiety    Asthma    Depression    Diabetes mellitus without complication (HCC)     Past Surgical History:  Procedure Laterality Date   corneal transplant left     EYE SURGERY      Social History   Socioeconomic History   Marital status: Legally Separated    Spouse name: Not on file   Number of children: 0   Years of education: Not on file   Highest education level: Not on file  Occupational History   Occupation: Production designer, theatre/television/film    Employer: [employer]  Tobacco Use   Smoking status: Former    Packs/day: 0.25    Years: 11.00    Pack years: 2.75    Types: Cigarettes    Quit date: 10/03/2017    Years since quitting: 3.5   Smokeless tobacco: Never   Vaping Use   Vaping Use: Never used  Substance and Sexual Activity   Alcohol use: Yes    Alcohol/week: 1.0 standard drink    Types: 1 Standard drinks or equivalent per week   Drug use: No   Sexual activity: Yes    Partners: Male    Birth control/protection: None    Comment: SS partner  Other Topics Concern   Not on file  Social History Narrative   Marital status: married x 4 years; happily married.       Lives; with husbnad      Employment: Building control surveyor at [employer]      Tobacco: socially; weekends      Alcohol: 1-3 drinks per week      Drugs: none      Exercise: none   Denies caffeine use    Social Determinants of Corporate investment banker Strain: Not on file  Food Insecurity: Not on file  Transportation Needs: Not on file  Physical Activity: Not on file  Stress: Not on file  Social Connections: Not on file    Family History  Problem Relation Age of Onset   Cancer Mother 32  pancreatic cancer   Hypertension Father    Heart disease Father 52       CAD   Bipolar disorder Brother    Diabetes Sister    Allergic rhinitis Neg Hx    Angioedema Neg Hx    Asthma Neg Hx    Eczema Neg Hx    Immunodeficiency Neg Hx    Urticaria Neg Hx     Health Maintenance  Topic Date Due   Hepatitis C Screening  Never done   OPHTHALMOLOGY EXAM  06/10/2018   FOOT EXAM  08/11/2018   URINE MICROALBUMIN  11/14/2018   COVID-19 Vaccine (4 - Booster for Moderna series) 04/09/2020   Pneumococcal Vaccine 16-66 Years old (2 - PCV) 03/04/2022 (Originally 05/29/2019)   HEMOGLOBIN A1C  09/01/2021   TETANUS/TDAP  04/30/2027   INFLUENZA VACCINE  Completed   HIV Screening  Completed   HPV VACCINES  Aged Out     ----------------------------------------------------------------------------------------------------------------------------------------------------------------------------------------------------------------- Physical Exam BP 138/80 (BP Location: Left Arm, Patient  Position: Sitting, Cuff Size: Large)    Pulse 92    Ht 5\' 9"  (1.753 m)    Wt 245 lb 3.2 oz (111.2 kg)    SpO2 99%    BMI 36.21 kg/m   Physical Exam Constitutional:      Appearance: He is well-developed.  HENT:     Head: Normocephalic and atraumatic.     Mouth/Throat:     Comments: Tonsillar swelling without exudate. Cardiovascular:     Rate and Rhythm: Normal rate and regular rhythm.  Pulmonary:     Effort: Pulmonary effort is normal.     Breath sounds: Normal breath sounds.  Musculoskeletal:     Cervical back: Neck supple.  Neurological:     General: No focal deficit present.     Mental Status: He is alert.  Psychiatric:        Mood and Affect: Mood normal.        Behavior: Behavior normal.    ------------------------------------------------------------------------------------------------------------------------------------------------------------------------------------------------------------------- Assessment and Plan  Strep pharyngitis Treatment with a course of amoxicillin 500 mg twice daily x10 days.  Warm salt water gargles as needed for throat pain.  Abnormal weight gain Working with management physician.  Ordering requested labs today.   Moderate persistent asthma Recent exacerbation.  Has recovered well from this.  Adding Singulair daily.  Continue albuterol as needed.   Meds ordered this encounter  Medications   amoxicillin (AMOXIL) 500 MG capsule    Sig: Take 1 capsule (500 mg total) by mouth 2 (two) times daily for 10 days.    Dispense:  20 capsule    Refill:  0   montelukast (SINGULAIR) 10 MG tablet    Sig: Take 1 tablet (10 mg total) by mouth at bedtime.    Dispense:  30 tablet    Refill:  3   albuterol (VENTOLIN HFA) 108 (90 Base) MCG/ACT inhaler    Sig: Inhale 2 puffs into the lungs every 6 (six) hours as needed for wheezing or shortness of breath.    Dispense:  8 g    Refill:  0    May replace with insurance preferred brand of albuterol  inhaler.    No follow-ups on file.    This visit occurred during the SARS-CoV-2 public health emergency.  Safety protocols were in place, including screening questions prior to the visit, additional usage of staff PPE, and extensive cleaning of exam room while observing appropriate contact time as indicated for disinfecting solutions.

## 2021-05-05 NOTE — Assessment & Plan Note (Signed)
Recent exacerbation.  Has recovered well from this.  Adding Singulair daily.  Continue albuterol as needed.

## 2021-05-05 NOTE — Assessment & Plan Note (Signed)
Treatment with a course of amoxicillin 500 mg twice daily x10 days.  Warm salt water gargles as needed for throat pain.

## 2021-05-15 ENCOUNTER — Ambulatory Visit (HOSPITAL_COMMUNITY): Payer: 59 | Admitting: Licensed Clinical Social Worker

## 2021-05-19 ENCOUNTER — Other Ambulatory Visit: Payer: Self-pay

## 2021-05-19 ENCOUNTER — Ambulatory Visit (HOSPITAL_COMMUNITY)
Admission: EM | Admit: 2021-05-19 | Discharge: 2021-05-19 | Disposition: A | Discharge status: Home / Self Care | Payer: Managed Care, Other (non HMO) | Attending: Physician Assistant | Admitting: Physician Assistant

## 2021-05-19 ENCOUNTER — Encounter (HOSPITAL_COMMUNITY): Payer: Self-pay | Admitting: Emergency Medicine

## 2021-05-19 DIAGNOSIS — J4521 Mild intermittent asthma with (acute) exacerbation: Secondary | ICD-10-CM | POA: Insufficient documentation

## 2021-05-19 DIAGNOSIS — Z87891 Personal history of nicotine dependence: Secondary | ICD-10-CM | POA: Insufficient documentation

## 2021-05-19 DIAGNOSIS — U071 COVID-19: Secondary | ICD-10-CM | POA: Diagnosis not present

## 2021-05-19 DIAGNOSIS — R059 Cough, unspecified: Secondary | ICD-10-CM | POA: Diagnosis present

## 2021-05-19 LAB — POC INFLUENZA A AND B ANTIGEN (URGENT CARE ONLY)
INFLUENZA A ANTIGEN, POC: NEGATIVE
INFLUENZA B ANTIGEN, POC: NEGATIVE

## 2021-05-19 MED ORDER — PREDNISONE 10 MG (21) PO TBPK: ORAL_TABLET | Freq: Every day | ORAL | 0 refills | Status: DC

## 2021-05-19 NOTE — Discharge Instructions (Addendum)
Advise Mucinex and Flonase Can take Delsym as needed for cough  Take prednisone as prescribed Use albuterol as needed  Return if symptoms become worse.

## 2021-05-19 NOTE — ED Provider Notes (Signed)
[facility]    CSN: [id number] Arrival date & time: 05/19/21  1539      History   Chief Complaint Chief Complaint  Patient presents with   Cough    HPI Alexander Osborne is a 41 y.o. male.   Pt with h/o asthma reports he began to experience a cough three days ago.  Yesterday he experienced mild wheezing and shortness of breath.  He has been using his albuterol inhaler with relief.  He denies fever, chills, n/v/d.  He has taken Mucinex with improvement as well.    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 Authorizing Provider  predniSONE (STERAPRED UNI-PAK 21 TAB) 10 MG (21) TBPK tablet Take by mouth daily. Take 6 tabs by mouth daily  for 2 days, then 5 tabs for 2 days, then 4 tabs for 2 days, then 3 tabs for 2 days, 2 tabs for 2 days, then 1 tab by mouth daily for 2 days 05/19/21  Yes Ward, Tylene Fantasia, PA-C  albuterol (VENTOLIN HFA) 108 (90 Base) MCG/ACT inhaler Inhale 2 puffs into the lungs every 6 (six) hours as needed for wheezing or shortness of breath. 04/30/21    Everrett Coombe, DO  citalopram (CELEXA) 10 MG tablet TAKE 1 TABLET(10 MG) BY MOUTH DAILY 04/16/21   Thresa Ross, MD  lamoTRIgine (LAMICTAL) 25 MG tablet Take 3 tablets (75 mg total) by mouth daily. Take 3 a day 03/07/21   Thresa Ross, MD  loratadine (CLARITIN) 10 MG tablet Take 10 mg by mouth daily.    [provider]  montelukast (SINGULAIR) 10 MG tablet Take 1 tablet (10 mg total) by mouth at bedtime. 04/30/21   Everrett Coombe, DO  Multiple Vitamins-Minerals (ONE-A-DAY MENS HEALTH FORMULA) TABS Take 1 tablet by mouth daily with breakfast.    [provider]  omeprazole (PRILOSEC) 40 MG capsule Take 1 capsule by mouth daily. 12/04/20   [provider]  sildenafil (VIAGRA) 50 MG tablet Take 50 mg by mouth daily as needed for erectile dysfunction.    [provider]  FLUoxetine (PROZAC) 40 MG capsule Take 1 capsule (40 mg total) by mouth daily. 11/02/20 11/26/20  Antonieta Pert, MD  traZODone (DESYREL) 50 MG tablet Take 50-100 mg by mouth at bedtime as needed. 11/21/20 01/23/21  [provider]    Family History Family History  Problem Relation Age of Onset   Cancer Mother 40       pancreatic cancer   Hypertension Father    Heart disease Father  39       CAD   Bipolar disorder Brother    Diabetes Sister    Allergic rhinitis Neg Hx    Angioedema Neg Hx    Asthma Neg Hx    Eczema Neg Hx    Immunodeficiency Neg Hx    Urticaria Neg Hx     Social History Social History   Tobacco Use   Smoking status: Former    Packs/day: 0.25    Years: 11.00    Pack years: 2.75    Types: Cigarettes    Quit date: 10/03/2017    Years since quitting: 3.6   Smokeless tobacco: Never  Vaping Use   Vaping Use: Never used  Substance Use Topics   Alcohol use: Yes    Alcohol/week: 1.0 standard drink    Types: 1 Standard drinks or equivalent per week   Drug use: No     Allergies   Fish-derived products and Shellfish allergy   Review of  Systems Review of Systems  Constitutional:  Negative for chills and fever.  HENT:  Positive for congestion. Negative for ear pain and sore throat.   Eyes:  Negative for pain and visual disturbance.  Respiratory:  Positive for cough, shortness of breath and wheezing.   Cardiovascular:  Negative for chest pain and palpitations.  Gastrointestinal:  Negative for abdominal pain and vomiting.  Genitourinary:  Negative for dysuria and hematuria.  Musculoskeletal:  Negative for arthralgias and back pain.  Skin:  Negative for color change and rash.  Neurological:  Negative for seizures and syncope.  All other systems reviewed and are negative.   Physical Exam Triage Vital Signs ED Triage Vitals  Enc Vitals Group     BP 05/19/21 1551 (!) 144/90     Pulse Rate 05/19/21 1551 97     Resp 05/19/21 1551 (!) 22     Temp 05/19/21 1551 98.3 F (36.8 C)     Temp Source 05/19/21 1551 Oral     SpO2 05/19/21 1551 99 %     Weight --      Height --      Head Circumference --      Peak Flow --      Pain Score 05/19/21 1547 3     Pain Loc --      Pain Edu? --      Excl. in [place]? --    No data found.  Updated Vital Signs BP (!) 144/90 (BP Location: Left Arm) Comment (BP Location): large cuff   Pulse 97    Temp 98.3 F (36.8 C) (Oral)    Resp (!) 22    SpO2 99%   Visual Acuity Right Eye Distance:   Left Eye Distance:   Bilateral Distance:    Right Eye Near:   Left Eye Near:    Bilateral Near:     Physical Exam Vitals and nursing note reviewed.  Constitutional:      General: He is not in acute distress.    Appearance: He is well-developed.  HENT:     Head: Normocephalic and atraumatic.  Eyes:     Conjunctiva/sclera: Conjunctivae normal.  Cardiovascular:     Rate and Rhythm: Normal rate and regular rhythm.     Heart sounds: No murmur heard. Pulmonary:     Effort: Pulmonary effort is normal. No respiratory distress.     Breath sounds: Normal breath sounds.  Abdominal:     Palpations:  Abdomen is soft.     Tenderness: There  is no abdominal tenderness.  Musculoskeletal:        General: No swelling.     Cervical back: Neck supple.  Skin:    General: Skin is warm and dry.     Capillary Refill: Capillary refill takes less than 2 seconds.  Neurological:     Mental Status: He is alert.  Psychiatric:        Mood and Affect: Mood normal.     UC Treatments / Results  Labs (all labs ordered are listed, but only abnormal results are displayed) Labs Reviewed  SARS CORONAVIRUS 2 (TAT 6-24 HRS)  POC INFLUENZA A AND B ANTIGEN (URGENT CARE ONLY)    EKG   Radiology No results found.  Procedures Procedures (including critical care time)  Medications Ordered in UC Medications - No data to display  Initial Impression / Assessment and Plan / UC Course  I have reviewed the triage vital signs and the nursing notes.  Pertinent labs & imaging results that were available during my care of the patient were reviewed by me and considered in my medical decision making (see chart for details).     Acute asthma exacerbation, prednisone prescribed today.  Pt in no acute respiratory distress, O2 99%, lungs clear.  Stable for discharge home.  Advised continued use of albuterol inhaler as needed and supportive care.  Return precautions discussed.  Final Clinical Impressions(s) / UC Diagnoses   Final diagnoses:  Mild intermittent asthma with exacerbation     Discharge Instructions      Advise Mucinex and Flonase Can take Delsym as needed for cough  Take prednisone as prescribed Use albuterol as needed  Return if symptoms become worse.    ED Prescriptions     Medication Sig Dispense Auth. Provider   predniSONE (STERAPRED UNI-PAK 21 TAB) 10 MG (21) TBPK tablet Take by mouth daily. Take 6 tabs by mouth daily  for 2 days, then 5 tabs for 2 days, then 4 tabs for 2 days, then 3 tabs for 2 days, 2 tabs for 2 days, then 1 tab by mouth daily for 2 days 42 tablet Ward, Lenise Arena,  PA-C      PDMP not reviewed this encounter.   Ward, Lenise Arena, PA-C 05/19/21 1657

## 2021-05-19 NOTE — ED Triage Notes (Signed)
Patient has a cough that started Friday evening.  Patient reports congestion in chest and does have stuffy/runny nose.  Mucinex helps some, but coughing episodes follow activity.  Patient has had some wheezing, used inhalers.  Patient feels he is familiar with his typical cough and cold scenarios and feels this is something different

## 2021-05-20 ENCOUNTER — Other Ambulatory Visit (HOSPITAL_COMMUNITY): Payer: Self-pay

## 2021-05-20 LAB — SARS CORONAVIRUS 2 (TAT 6-24 HRS): SARS Coronavirus 2: POSITIVE — AB

## 2021-05-20 MED ORDER — LAMOTRIGINE 25 MG PO TABS: 75.0000 mg | ORAL_TABLET | Freq: Every day | ORAL | 0 refills | Status: DC

## 2021-05-24 LAB — CBC WITH DIFFERENTIAL/PLATELET
Absolute Monocytes: 601 cells/uL (ref 200–950)
Basophils Absolute: 27 cells/uL (ref 0–200)
Basophils Relative: 0.3 %
Eosinophils Absolute: 9 cells/uL — ABNORMAL LOW (ref 15–500)
Eosinophils Relative: 0.1 %
HCT: 43.8 % (ref 38.5–50.0)
Hemoglobin: 15.2 g/dL (ref 13.2–17.1)
Lymphs Abs: 2548 cells/uL (ref 850–3900)
MCH: 30.2 pg (ref 27.0–33.0)
MCHC: 34.7 g/dL (ref 32.0–36.0)
MCV: 86.9 fL (ref 80.0–100.0)
MPV: 12.3 fL (ref 7.5–12.5)
Monocytes Relative: 6.6 %
Neutro Abs: 5915 cells/uL (ref 1500–7800)
Neutrophils Relative %: 65 %
Platelets: 253 10*3/uL (ref 140–400)
RBC: 5.04 10*6/uL (ref 4.20–5.80)
RDW: 13.4 % (ref 11.0–15.0)
Total Lymphocyte: 28 %
WBC: 9.1 10*3/uL (ref 3.8–10.8)

## 2021-05-24 LAB — COMPLETE METABOLIC PANEL WITH GFR
AG Ratio: 1.5 (calc) (ref 1.0–2.5)
ALT: 27 U/L (ref 9–46)
AST: 20 U/L (ref 10–40)
Albumin: 4.8 g/dL (ref 3.6–5.1)
Alkaline phosphatase (APISO): 63 U/L (ref 36–130)
BUN: 17 mg/dL (ref 7–25)
CO2: 29 mmol/L (ref 20–32)
Calcium: 9.6 mg/dL (ref 8.6–10.3)
Chloride: 102 mmol/L (ref 98–110)
Creat: 1.02 mg/dL (ref 0.60–1.26)
Globulin: 3.2 g/dL (calc) (ref 1.9–3.7)
Glucose, Bld: 117 mg/dL — ABNORMAL HIGH (ref 65–99)
Potassium: 4.2 mmol/L (ref 3.5–5.3)
Sodium: 138 mmol/L (ref 135–146)
Total Bilirubin: 0.6 mg/dL (ref 0.2–1.2)
Total Protein: 8 g/dL (ref 6.1–8.1)
eGFR: 96 mL/min/{1.73_m2} (ref >=60)

## 2021-05-24 LAB — LIPID PANEL
Cholesterol: 198 mg/dL (ref <200)
HDL: 48 mg/dL (ref >=40)
LDL Cholesterol (Calc): 125 mg/dL (calc) — ABNORMAL HIGH
Non-HDL Cholesterol (Calc): 150 mg/dL (calc) — ABNORMAL HIGH (ref <130)
Total CHOL/HDL Ratio: 4.1 (calc) (ref <5.0)
Triglycerides: 131 mg/dL (ref <150)

## 2021-05-24 LAB — T3: T3, Total: 108 ng/dL (ref 76–181)

## 2021-05-24 LAB — HEMOGLOBIN A1C
Hgb A1c MFr Bld: 6.6 % of total Hgb — ABNORMAL HIGH (ref <5.7)
Mean Plasma Glucose: 143 mg/dL
eAG (mmol/L): 7.9 mmol/L

## 2021-05-24 LAB — TESTOSTERONE TOTAL,FREE,BIO, MALES
Albumin: 4.8 g/dL (ref 3.6–5.1)
Sex Hormone Binding: 27 nmol/L (ref 10–50)
Testosterone, Bioavailable: 222.2 ng/dL (ref 110.0–575.0)
Testosterone, Free: 101.6 pg/mL (ref 46.0–224.0)
Testosterone: 628 ng/dL (ref 250–827)

## 2021-05-24 LAB — HIGH SENSITIVITY CRP: hs-CRP: 0.3 mg/L

## 2021-05-24 LAB — INSULIN, RANDOM: Insulin: 16.7 u[IU]/mL

## 2021-05-24 LAB — VITAMIN B12: Vitamin B-12: 484 pg/mL (ref 200–1100)

## 2021-05-24 LAB — TSH: TSH: 1.06 mIU/L (ref 0.40–4.50)

## 2021-05-24 LAB — CORTISOL: Cortisol, Plasma: 5.5 ug/dL

## 2021-05-24 LAB — FERRITIN: Ferritin: 52 ng/mL (ref 38–380)

## 2021-05-24 LAB — PROGESTERONE: Progesterone: <0.5 ng/mL (ref <1.4)

## 2021-05-24 LAB — T4, FREE: Free T4: 1 ng/dL (ref 0.8–1.8)

## 2021-05-24 LAB — ESTRADIOL: Estradiol: 29 pg/mL (ref <=39)

## 2021-05-24 LAB — URIC ACID: Uric Acid, Serum: 4.3 mg/dL (ref 4.0–8.0)

## 2021-05-29 ENCOUNTER — Ambulatory Visit (HOSPITAL_COMMUNITY): Payer: 59 | Admitting: Licensed Clinical Social Worker

## 2021-05-29 NOTE — Progress Notes (Signed)
Therapist contacted patient by My Chart and he did not respond session is a no show.

## 2021-05-30 ENCOUNTER — Encounter (HOSPITAL_COMMUNITY): Payer: Self-pay | Admitting: Psychiatry

## 2021-05-30 ENCOUNTER — Ambulatory Visit (INDEPENDENT_AMBULATORY_CARE_PROVIDER_SITE_OTHER): Payer: 59 | Admitting: Psychiatry

## 2021-05-30 VITALS — BP 110/76 | HR 85 | Ht 70.0 in | Wt 246.0 lb

## 2021-05-30 DIAGNOSIS — F39 Unspecified mood [affective] disorder: Secondary | ICD-10-CM | POA: Diagnosis not present

## 2021-05-30 DIAGNOSIS — F411 Generalized anxiety disorder: Secondary | ICD-10-CM | POA: Diagnosis not present

## 2021-05-30 DIAGNOSIS — F331 Major depressive disorder, recurrent, moderate: Secondary | ICD-10-CM

## 2021-05-30 DIAGNOSIS — Z87828 Personal history of other (healed) physical injury and trauma: Secondary | ICD-10-CM

## 2021-05-30 MED ORDER — LAMOTRIGINE 25 MG PO TABS: 75.0000 mg | ORAL_TABLET | Freq: Every day | ORAL | 1 refills | Status: DC

## 2021-05-30 MED ORDER — CITALOPRAM HYDROBROMIDE 10 MG PO TABS: ORAL_TABLET | ORAL | 0 refills | Status: DC

## 2021-05-30 NOTE — Progress Notes (Signed)
[facility] Follow Up Visit  Patient Identification: Alexander Osborne MRN:  [id number] Date of Evaluation:  05/30/2021 Referral Source: hospital discharge Chief Complaint:  follow up  depression, mood symptoms Visit Diagnosis:    ICD-10-CM   1. Unspecified mood (affective) disorder (HCC)  F39     2. History of trauma  Z87.828     3. GAD (generalized anxiety disorder)  F41.1     4. MDD (major depressive disorder), recurrent episode, moderate (HCC)  F33.1          History of Present Illness: Patient is a 41  years old currently single male referred by hospital discharge with OD admission for one day  He works full-time at [employer] improved, relationship concern better  In therapy Working on self improvement and not to dwell on things Lamictal helps , no rash    Aggravating factors; relationhship and job pressure  Modifying factors : relationship   Severity better    Past Psychiatric History: depression  Previous Psychotropic Medications: Yes   Substance Abuse History in the last 12 months:  Yes.    Consequences of Substance Abuse: Alcohol use not regular, but discussed its effect on depression  Past Medical History:  Past Medical History:  Diagnosis Date   Allergy    Anxiety    Asthma    Depression    Diabetes mellitus without complication ([place])     Past Surgical History:  Procedure Laterality Date   corneal transplant left     EYE SURGERY      Family Psychiatric History: brother : bipolar, sister, depression  Family History:  Family History  Problem Relation Age of Onset   Cancer Mother 27       pancreatic cancer   Hypertension Father    Heart disease Father 73       CAD   Bipolar disorder Brother    Diabetes Sister    Allergic rhinitis Neg Hx    Angioedema Neg Hx    Asthma Neg Hx    Eczema Neg Hx    Immunodeficiency Neg Hx    Urticaria Neg Hx     Social History:   Social History   Socioeconomic History    Marital status: Legally Separated    Spouse name: Not on file   Number of children: 0   Years of education: Not on file   Highest education level: Not on file  Occupational History   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  Tobacco Use   Smoking status: Former    Packs/day: 0.25    Years: 11.00    Pack years: 2.75    Types: Cigarettes    Quit date: 10/03/2017    Years since quitting: 3.6   Smokeless tobacco: Never  Vaping Use   Vaping Use: Never used  Substance and Sexual Activity   Alcohol use: Yes    Alcohol/week: 13.0 standard drinks    Types: 12 Cans of beer, 1 Standard drinks or equivalent per week   Drug use: No   Sexual activity: Yes    Partners: Male    Birth control/protection: None    Comment: SS partner  Other Topics Concern   Not on file  Social History Narrative   Marital status: married x 4 years; happily married.       Lives; with husbnad      Employment: Librarian, academic at [employer]: socially; weekends  Alcohol: 1-3 drinks per week      Drugs: none      Exercise: none   Denies caffeine use    Social Determinants of Health   Financial Resource Strain: Not on file  Food Insecurity: Not on file  Transportation Needs: Not on file  Physical Activity: Not on file  Stress: Not on file  Social Connections: Not on file     Allergies:   Allergies  Allergen Reactions   Fish-Derived Products Other (See Comments)    Allergic, but reaction not recalled   Shellfish Allergy Other (See Comments)    Patient does not recall the reaction    Metabolic Disorder Labs: Lab Results  Component Value Date   HGBA1C 6.6 (H) 05/23/2021   MPG 143 05/23/2021   MPG 120 03/04/2021   No results found for: PROLACTIN Lab Results  Component Value Date   CHOL 198 05/23/2021   TRIG 131 05/23/2021   HDL 48 05/23/2021   CHOLHDL 4.1 05/23/2021   VLDL 25 11/02/2020   LDLCALC 125 (H) 05/23/2021   LDLCALC 114 (H) 03/04/2021   Lab Results   Component Value Date   TSH 1.06 05/23/2021    Therapeutic Level Labs: No results found for: LITHIUM No results found for: CBMZ No results found for: VALPROATE  Current Medications: Current Outpatient Medications  Medication Sig Dispense Refill   albuterol (VENTOLIN HFA) 108 (90 Base) MCG/ACT inhaler Inhale 2 puffs into the lungs every 6 (six) hours as needed for wheezing or shortness of breath. 8 g 0   loratadine (CLARITIN) 10 MG tablet Take 10 mg by mouth daily.     montelukast (SINGULAIR) 10 MG tablet Take 1 tablet (10 mg total) by mouth at bedtime. 30 tablet 3   Multiple Vitamins-Minerals (ONE-A-DAY MENS HEALTH FORMULA) TABS Take 1 tablet by mouth daily with breakfast.     omeprazole (PRILOSEC) 40 MG capsule Take 1 capsule by mouth daily.     predniSONE (STERAPRED UNI-PAK 21 TAB) 10 MG (21) TBPK tablet Take by mouth daily. Take 6 tabs by mouth daily  for 2 days, then 5 tabs for 2 days, then 4 tabs for 2 days, then 3 tabs for 2 days, 2 tabs for 2 days, then 1 tab by mouth daily for 2 days 42 tablet 0   sildenafil (VIAGRA) 50 MG tablet Take 50 mg by mouth daily as needed for erectile dysfunction.     citalopram (CELEXA) 10 MG tablet TAKE 1 TABLET(10 MG) BY MOUTH DAILY 90 tablet 0   lamoTRIgine (LAMICTAL) 25 MG tablet Take 3 tablets (75 mg total) by mouth daily. Take 3 a day 90 tablet 1   No current facility-administered medications for this visit.     Psychiatric Specialty Exam: Review of Systems  Skin:  Negative for rash.  Psychiatric/Behavioral:  Negative for agitation, decreased concentration, hallucinations and self-injury.    Blood pressure 110/76, pulse 85, height 5\' 10"  (1.778 m), weight 246 lb (111.6 kg), SpO2 97 %.Body mass index is 35.3 kg/m.  General Appearance: Casual  Eye Contact:  Good  Speech:  Clear and Coherent  Volume:  Normal  Mood: improving  Affect:  Congruent  Thought Process:  Goal Directed  Orientation:  Full (Time, Place, and Person)  Thought  Content:  Rumination  Suicidal Thoughts:  No  Homicidal Thoughts:  No  Memory:  Immediate;   Fair  Judgement:  Fair  Insight:  Fair  Psychomotor Activity:  Normal  Concentration:  Concentration: Fair  Recall:  [place] of Knowledge:Good  Language: Good  Akathisia:  No  Handed:   AIMS (if indicated):  not done  Assets:  Desire for Improvement Physical Health  ADL's:  Intact  Cognition: WNL  Sleep:  Fair   Screenings: AIMS    Flowsheet Row Admission (Discharged) from 11/01/2020 in [facility]  AIMS Total Score 0      AUDIT    Flowsheet Row Admission (Discharged) from 11/01/2020 in [facility]  Alcohol Use Disorder Identification Test Final Score (AUDIT) 3      GAD-7    Flowsheet Row Office Visit from 03/04/2021 in [place] Visit from 10/18/2020 in [place]  Total GAD-7 Score 6 7      PHQ2-9    [place] Office Visit from 03/04/2021 in [place] from 01/11/2021 in [place] Video Visit from 11/26/2020 in [place] Office Visit from 10/18/2020 in [place] Video Visit from 05/28/2020 in [facility] Total Score 4 6 1 4 3   PHQ-9 Total Score 9 12 -- 13 9      [place] Visit from 05/30/2021 in [place] ED from 05/19/2021 in [facility] ED from 04/22/2021 in [place] DEPT  C-SSRS RISK CATEGORY No Risk No Risk No Risk       Assessment and Plan: as follows  Prior documentation reviewed   Major depressive disorder recurrent moderate to severe: doing beter, continue lamictal  Continue celexa    Generalized anxiety disorder  improving, continue celexa  Fu 2 m  Time spent 20 min in office including face to face  Merian Capron, MD 2/9/20239:13 AM

## 2021-06-03 IMAGING — CR LEFT TIBIA AND FIBULA - 2 VIEW
2 series · 2 of 2 positions shown · non-contrast
Comparison: None.

CLINICAL DATA: Machete injury to left leg while doing yard work
today.

EXAM:
LEFT TIBIA AND FIBULA - 2 VIEW

[tibia ap]
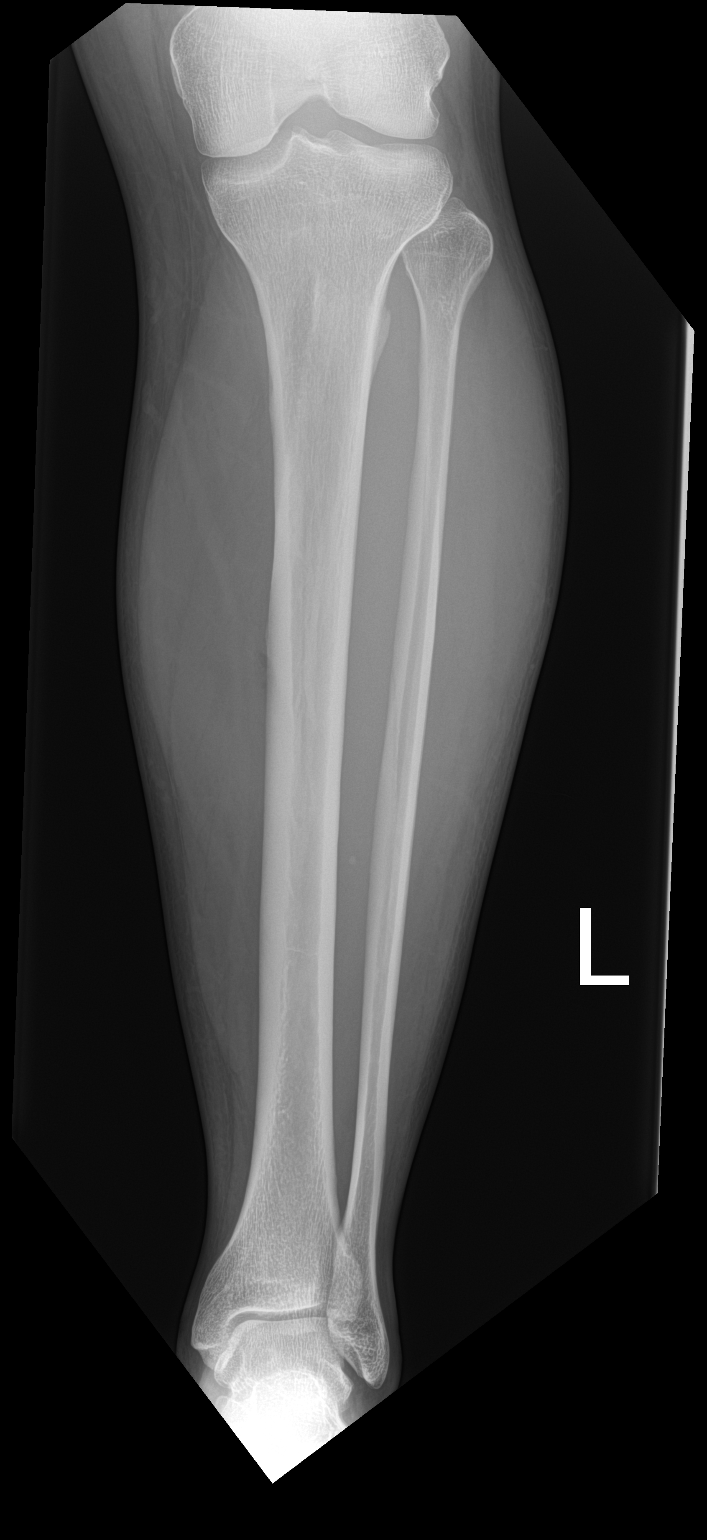

[tibia lat]
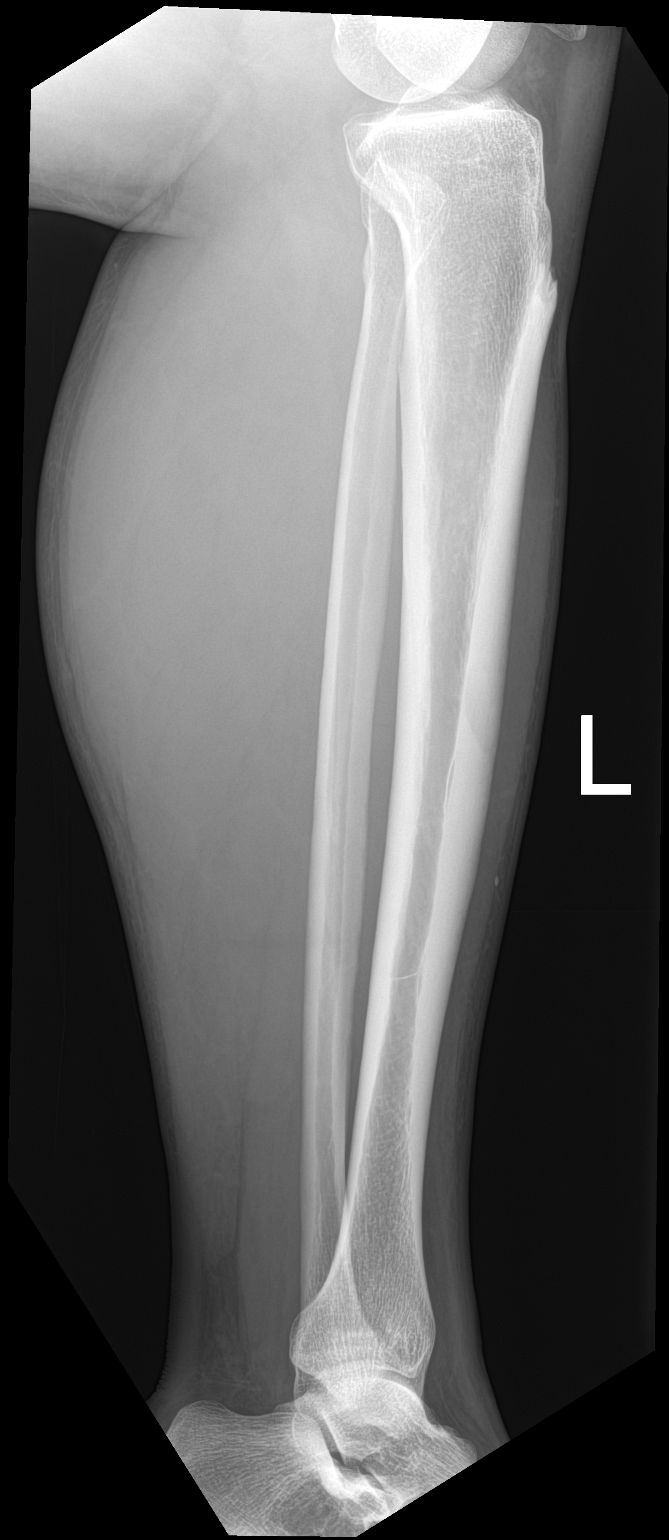

[2 of 2 positions shown; findings below may reference images not displayed]

FINDINGS: There is no evidence of fracture or other focal bone lesions. Soft
tissues are unremarkable.
IMPRESSION: Negative.

## 2021-06-25 ENCOUNTER — Encounter: Payer: Self-pay | Admitting: Family Medicine

## 2021-06-25 MED ORDER — MONTELUKAST SODIUM 10 MG PO TABS: 10.0000 mg | ORAL_TABLET | Freq: Every day | ORAL | 2 refills | Status: DC

## 2021-06-27 ENCOUNTER — Ambulatory Visit (HOSPITAL_COMMUNITY): Payer: 59 | Admitting: Licensed Clinical Social Worker

## 2021-06-27 NOTE — Progress Notes (Signed)
Therapist contacted patient through My Chart for session and he did not respond. Session is a no show ?

## 2021-07-11 ENCOUNTER — Ambulatory Visit (HOSPITAL_COMMUNITY): Payer: 59 | Admitting: Licensed Clinical Social Worker

## 2021-07-11 NOTE — Progress Notes (Signed)
Therapist contacted patient through My Chart and he did not respond session is a no show ?

## 2021-07-25 ENCOUNTER — Ambulatory Visit (INDEPENDENT_AMBULATORY_CARE_PROVIDER_SITE_OTHER): Payer: 59 | Admitting: Licensed Clinical Social Worker

## 2021-07-25 DIAGNOSIS — F331 Major depressive disorder, recurrent, moderate: Secondary | ICD-10-CM | POA: Diagnosis not present

## 2021-07-25 DIAGNOSIS — F39 Unspecified mood [affective] disorder: Secondary | ICD-10-CM | POA: Diagnosis not present

## 2021-07-25 DIAGNOSIS — Z87828 Personal history of other (healed) physical injury and trauma: Secondary | ICD-10-CM | POA: Diagnosis not present

## 2021-07-25 DIAGNOSIS — F411 Generalized anxiety disorder: Secondary | ICD-10-CM

## 2021-07-25 NOTE — Progress Notes (Signed)
?Virtual Visit via Video Note ? ?I connected with Kayston Jodoin [place] on 07/25/21 at 10:00 AM EDT by a video enabled telemedicine application and verified that I am speaking with the correct person using two identifiers. ? ?Location: ?Patient: work ?Provider: home office ? ?I discussed the limitations of evaluation and management by telemedicine and the availability of in person appointments. The patient expressed understanding and agreed to proceed. ? ?I discussed the assessment and treatment plan with the patient. The patient was provided an opportunity to ask questions and all were answered. The patient agreed with the plan and demonstrated an understanding of the instructions. ?  ?The patient was advised to call back or seek an in-person evaluation if the symptoms worsen or if the condition fails to improve as anticipated. ? ?I provided 45 minutes of non-face-to-face time during this encounter. ? ? ?THERAPIST PROGRESS NOTE ? ?Session Time: 10:00 AM to 10:45 AM ? ?Participation Level: Active ? ?Behavioral Response: CasualAlertEuthymic and Irritable ? ?Type of Therapy: Individual Therapy ? ?Treatment Goals addressed: Managing stress help with binge eating, controlling moods, better able to communicate, self-esteem, coping ? ?ProgressTowards Goals: Progressing-patient identified ways he depended on himself to work through a hard time indicating progress with self-esteem as well as reaching out to supports indicating good coping, patient actively using session to process through feelings to help with mood and his relationship ? ?Interventions: Solution Focused, Strength-based, Supportive, Reframing, and Other: Coping with relationships, coping with mood ? ?Summary: Deyton Ellenbecker is a 41 y.o. male who presents with new job two jobs sleeping through everything. Stopped commuting for an hour at [place] now. Had a moment. Technically resigned his job. Woke up in a bad mood went to work not for him don't want  to be here and do this. Walked out of job. Called best friend and said not feeling too great right now. Had him calmed down. Patient realized that he had abruptly stopped medications with new job and got irritated easily.  Forgetting because sleeping a lot and adjusting to new schedule didn't realize not taking medications. Job part-time over night until 4 AM in morning. Made him forget things. Still in process of getting in groove of things. Irritated to the point that didn't want to exist recognize that and called friends and they calmed him down. Had to have conversation with district leader told him what happened. Said to call HR said supported him. Had been unhappy at that store don't know if that was that was why too irritated. Didn't work out that week with no dopamine. Didn't realize stopping medications wasn't that good for him. Didn't want to put back at store because so unhappy at that place. Old boss reached out to him. New store friendly store a person working there was gladly willing to move to another store to make a space for him. Michelle Piper make space and now with old boss knows him emotional needs, at [employer] at [employer]. He and boss has interesting relationship like many different family members wrapped in one. Known for decade decided to rescind resignation all reached out and showed cared and made him feel appreciated. There which is new thing for patient. Also working at [employer] and loves that job loves airplanes and likes unloading cargo, benefits feels like hobby and helps with his goals. Had debt after separated and trying to pay that down, hopefully move to where he wants to be with his partner. It has taken awhile to get into  a good space. Has some PTSD first two days boss coordinated to work with Social research officer, governmentstore manager to acclimate  with for 3 days, at first everything felt like shouldn't be there, didn't belong but that is going away, that subsided and think had PTSD from the other store.  So sensitive. Still can have the little things can flare up feel traumatized. Didn't feel appreciated, not wanted, giving a lot and completely taken advantage of and not doing anything right. Didn't reach out to therapist but thought about that maybe need to do things on his own not a bad thing it forced him to really depend on himself. Also reach out to support system that he does have. Reached out also to sister and everyone he thought that he could reach out to. Tell them not doing well wrote lengthy Facebook page do things different struggles in life and not do the same thing. Positive responses for that. Feels bad for missing appointments but forced to reach out to people around him. Thinks he has a good handle of both jobs and schedule. Brother is bipolar schizophrenic some of his explosive tendencies is irrational thoughts and behaviors come to realize that see that in him. Thought had a little bipolar more controlled compared to him. Understanding him a little more know irrational in the moment but don't feel like himself and don't have control over himself. Therapist pointed out has insight he agrees. Patient and sister struggling what want to do with brother. Insight to the fact that part of his biology can't blame him for that. Recognize this morning escalated quickly. Needs to manage better. Still struggling understanding sometimes communication a lot of it is cultural. Very hard to understand gets in to a lot of problems shouldn't not understanding each other. Need to re-evaluate relationship not feeling happy. It is happy birthday today. Partner let him know go to [facility] [place] and patient made a face. Feeling like a little not part of his life his comment led to patient feeling  disrespected partner said do we have to do this right now. Now feel like not respecting his feelings. Got into it. At a point he is saying one thing and he is saying another and not connecting. Partner said it is you problem not a  me problem. Everything this morning everything pointing to patient. Patient pointed out he was not self-reflecting on his part why patient feeling this way. Came out partner is the person people come to taking care of everyone and nobody taking care of him. Affected patient a little. Patient says at the same time he has let patient know what is going on. Patient can ask more what is going and work on it, but he has to come to patient. At a point in conversation where it was said if not happy re-evaluate this. It was serious and then he was off to something and laughing deflecting and that was really irritating. Then when let him have it, then hung up not at a happy place. Next step is to work step back so heated.  Therapist  suggested some approaches but also gave patient space to figure out as well.  ? ?Therapist reviewed significant events and any changes in mood functioning as last session was in January.  Updated treatment plan and patient gave consent to complete virtually.  Noted patient going through a difficult time but at the same time feels that he depended on himself and reached out to supports so not necessarily negative that  did not reach out to therapist.  Noted patient has mood issues therapist noted he has insight about this which helps him be more effective in working on this patient knowledges that this is something for him to work on.  The things he has to work on in his relationship but the same time pointing out to his partner there were things for his partner to work on.  Things such as letting people know how he is feeling so people can reach out to help him.  Thinking about others is positive but also have to speak up about your own needs to be healthy human.  Therapist shared her perspective that patient feeling not as much part of his partner's life is legitimate given that they are in 2 different countries.  Patient also feels given his tracking how much time together legitimately reason  to feel as he is feeling.  Therapist felt helpful for patient to process feelings to help with coping, reflect on relationship will help him get insight as to how he wants to address issues in relation

## 2021-07-30 ENCOUNTER — Ambulatory Visit (HOSPITAL_COMMUNITY): Payer: 59 | Admitting: Psychiatry

## 2021-07-31 ENCOUNTER — Emergency Department (INDEPENDENT_AMBULATORY_CARE_PROVIDER_SITE_OTHER)
Admission: EM | Admit: 2021-07-31 | Discharge: 2021-07-31 | Disposition: A | Discharge status: Home / Self Care | Payer: Managed Care, Other (non HMO) | Source: Home / Self Care

## 2021-07-31 ENCOUNTER — Emergency Department: Admit: 2021-07-31 | Discharge status: Absent without leave | Payer: Self-pay

## 2021-07-31 DIAGNOSIS — J029 Acute pharyngitis, unspecified: Secondary | ICD-10-CM

## 2021-07-31 DIAGNOSIS — R110 Nausea: Secondary | ICD-10-CM | POA: Diagnosis not present

## 2021-07-31 LAB — POCT FASTING CBG KUC MANUAL ENTRY: POCT Glucose (KUC): 118 mg/dL — AB (ref 70–99)

## 2021-07-31 MED ORDER — ONDANSETRON 8 MG PO TBDP
8.0000 mg | ORAL_TABLET | Freq: Once | ORAL | Status: AC
  Administered 2021-07-31: 8 mg via ORAL

## 2021-07-31 MED ORDER — ONDANSETRON 8 MG PO TBDP: 8.0000 mg | ORAL_TABLET | Freq: Three times a day (TID) | ORAL | 0 refills | Status: DC | PRN

## 2021-07-31 NOTE — ED Triage Notes (Signed)
Pt states that he has a sore throat, nausea, dizziness, cough and vomiting. X1 day ? ? ?Pt states that he is vaccinated for covid. ?Pt states that he had flu vaccine.  ?

## 2021-07-31 NOTE — Discharge Instructions (Addendum)
Advised patient that her rapid strep and COVID-19 were negative today.  Advised patient may take Zofran daily or as needed for nausea.  Advised we will follow-up with throat culture results once received.  Advised patient if symptoms worsen and/or unresolved please follow-up with PCP or here for further evaluation. ?

## 2021-07-31 NOTE — ED Provider Notes (Signed)
?[facility] ? ? ? ?CSN: [id number] ?Arrival date & time: 07/31/21  1616 ? ? ?  ? ?History   ?Chief Complaint ?Chief Complaint  ?Patient presents with  ? Sore Throat  ?  Sore throat, nausea, dizziness, cough and vomiting. X1 day  ? ? ?HPI ?Alexander Osborne is a 41 y.o. male.  ? ?HPI the 67-year-old male presents with sore throat, nausea, dizziness, cough and vomiting for 1 day.  PMH significant for strep pharyngitis, moderate persistent asthma, and T2DM without complication. ? ?Past Medical History:  ?Diagnosis Date  ? Allergy   ? Anxiety   ? Asthma   ? Depression   ? Diabetes mellitus without complication (HCC)   ? ? ?Patient Active Problem List  ? Diagnosis Date Noted  ? Abnormal weight gain 05/05/2021  ? Strep pharyngitis 05/05/2021  ? Moderate persistent asthma 05/05/2021  ? 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 Bilateral carpal tunnel syndrome 10/21/2020  ? Routine screening for STI (sexually transmitted infection) 10/21/2020  ? History of type 2 diabetes mellitus 10/21/2020  ? Laryngopharyngeal reflux 10/18/2020  ? 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 Food allergy 06/29/2017  ? Pruritus 06/29/2017  ? 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 ? ?Past Surgical History:  ?Procedure Laterality Date  ? corneal transplant left    ? EYE SURGERY    ? ? ? ? ? ?Home Medications   ? ?Prior to Admission medications   ?Medication Sig Start Date End Date Taking? Authorizing Provider  ?albuterol (VENTOLIN HFA) 108 (90 Base) MCG/ACT inhaler Inhale 2 puffs into the lungs every 6 (six) hours as needed for wheezing or shortness of breath. 04/30/21  Yes Everrett Coombe, DO  ?citalopram (CELEXA) 10 MG tablet TAKE 1 TABLET(10 MG) BY MOUTH DAILY 05/30/21  Yes Thresa Ross, MD  ?Fexofenadine HCl (ALLEGRA PO) Take by mouth.   Yes [provider]  ?lamoTRIgine (LAMICTAL) 25 MG tablet Take 3 tablets (75 mg total) by mouth daily. Take 3 a day  05/30/21  Yes Thresa Ross, MD  ?mometasone (NASONEX) 50 MCG/ACT nasal spray Place 2 sprays into the nose daily.   Yes [provider]  ?montelukast (SINGULAIR) 10 MG tablet Take 1 tablet (10 mg total) by mouth at bedtime. 06/25/21  Yes Everrett Coombe, DO  ?Multiple Vitamins-Minerals (ONE-A-DAY MENS HEALTH FORMULA) TABS Take 1 tablet by mouth daily with breakfast.   Yes [provider]  ?omeprazole (PRILOSEC) 40 MG capsule Take 1 capsule by mouth daily. 12/04/20  Yes [provider]  ?ondansetron (ZOFRAN-ODT) 8 MG disintegrating tablet Take 1 tablet (8 mg total) by mouth every 8 (eight) hours as needed for nausea or vomiting. 07/31/21  Yes Trevor Iha, FNP  ?sildenafil (VIAGRA) 50 MG tablet Take 50 mg by mouth daily as needed for erectile dysfunction.   Yes [provider]  ?loratadine (CLARITIN) 10 MG tablet Take 10 mg by mouth daily.    [provider]  ?predniSONE (STERAPRED UNI-PAK 21 TAB) 10 MG (21) TBPK tablet Take by mouth daily. Take 6 tabs by mouth daily  for 2 days, then 5 tabs for 2 days, then 4 tabs for 2 days, then 3 tabs for 2 days, 2 tabs for 2 days, then 1 tab by mouth daily for 2 days 05/19/21   Ward, Tylene Fantasia, PA-C  ?FLUoxetine (PROZAC) 40 MG capsule Take 1 capsule (40 mg total) by mouth daily. 11/02/20 11/26/20  Antonieta Pert, MD  ?  traZODone (DESYREL) 50 MG tablet Take 50-100 mg by mouth at bedtime as needed. 11/21/20 01/23/21  [provider]  ? ? ?Family History ?Family History  ?Problem Relation Age of Onset  ? Cancer Mother 15  ?     pancreatic cancer  ? Hypertension Father   ? Heart disease Father 40  ?     CAD  ? Bipolar disorder Brother   ? Diabetes Sister   ? Allergic rhinitis Neg Hx   ? Angioedema Neg Hx   ? Asthma Neg Hx   ? Eczema Neg Hx   ? Immunodeficiency Neg Hx   ? Urticaria Neg Hx   ? ? ?Social History ?Social History  ? ?Tobacco Use  ? Smoking status: Former  ?  Packs/day: 0.25  ?  Years: 11.00  ?  Pack years: 2.75  ?  Types:  Cigarettes  ?  Quit date: 10/03/2017  ?  Years since quitting: 3.8  ? Smokeless tobacco: Never  ?Vaping Use  ? Vaping Use: Never used  ?Substance Use Topics  ? Alcohol use: Yes  ?  Alcohol/week: 13.0 standard drinks  ?  Types: 12 Cans of beer, 1 Standard drinks or equivalent per week  ? Drug use: No  ? ? ? ?Allergies   ?Fish-derived products and Shellfish allergy ? ? ?Review of Systems ?Review of Systems  ?HENT:  Positive for sore throat.   ?Neurological:  Positive for dizziness.  ?All other systems reviewed and are negative. ? ? ?Physical Exam ?Triage Vital Signs ?ED Triage Vitals  ?Enc Vitals Group  ?   BP   ?   Pulse   ?   Resp   ?   Temp   ?   Temp src   ?   SpO2   ?   Weight   ?   Height   ?   Head Circumference   ?   Peak Flow   ?   Pain Score   ?   Pain Loc   ?   Pain Edu?   ?   Excl. in GC?   ? ?No data found. ? ?Updated Vital Signs ?BP 120/82 (BP Location: Right Arm)   Pulse 70   Temp 98.1 ?F (36.7 ?C) (Oral)   Resp 18   Ht 5\' 10"  (1.778 m)   Wt 231 lb (104.8 kg)   SpO2 97%   BMI 33.15 kg/m?  ? ? ?Physical Exam ?Vitals and nursing note reviewed.  ?Constitutional:   ?   General: He is not in acute distress. ?   Appearance: Normal appearance. He is obese. He is not ill-appearing.  ?HENT:  ?   Head: Normocephalic and atraumatic.  ?   Right Ear: Tympanic membrane, ear canal and external ear normal.  ?   Left Ear: Tympanic membrane, ear canal and external ear normal.  ?   Mouth/Throat:  ?   Mouth: Mucous membranes are moist.  ?   Pharynx: Oropharynx is clear.  ?Eyes:  ?   Extraocular Movements: Extraocular movements intact.  ?   Conjunctiva/sclera: Conjunctivae normal.  ?   Pupils: Pupils are equal, round, and reactive to light.  ?Cardiovascular:  ?   Rate and Rhythm: Normal rate and regular rhythm.  ?   Pulses: Normal pulses.  ?   Heart sounds: Normal heart sounds.  ?Pulmonary:  ?   Effort: Pulmonary effort is normal.  ?   Breath sounds: Normal breath sounds. No wheezing, rhonchi or rales.   ?  Musculoskeletal:  ?   Cervical back: Normal range of motion and neck supple. No tenderness.  ?Lymphadenopathy:  ?   Cervical: No cervical adenopathy.  ?Skin: ?   General: Skin is warm and dry.  ?Neurological:  ?   General: No focal deficit present.  ?   Mental Status: He is alert and oriented to person, place, and time.  ? ? ? ?UC Treatments / Results  ?Labs ?(all labs ordered are listed, but only abnormal results are displayed) ?Labs Reviewed  ?POCT FASTING [facility]G [facility] MANUAL ENTRY - Abnormal; Notable for the following components:  ?    Result Value  ? POCT Glucose ([facility]) 118 (*)   ? All other components within normal limits  ?CULTURE, GROUP A STREP  ?POC SARS CORONAVIRUS 2 AG -  ED  ?POCT RAPID STREP A (OFFICE)  ? ? ?EKG ? ? ?Radiology ?No results found. ? ?Procedures ?Procedures (including critical care time) ? ?Medications Ordered in UC ?Medications  ?ondansetron (ZOFRAN-ODT) disintegrating tablet 8 mg (8 mg Oral Given 07/31/21 1706)  ? ? ?Initial Impression / Assessment and Plan / UC Course  ?I have reviewed the triage vital signs and the nursing notes. ? ?Pertinent labs & imaging results that were available during my care of the patient were reviewed by me and considered in my medical decision making (see chart for details). ? ?  ? ?MDM: 1.  Sore throat-rapid strep negative, throat culture ordered; 2. Nausea-Zofran 8 mg given p.o. once in clinic prior to discharge Rx'd Zofran. Advised patient that her rapid strep and COVID-19 were negative today.  Advised patient may take Zofran daily or as needed for nausea.  Advised we will follow-up with throat culture results once received.  Advised patient if symptoms worsen and/or unresolved please follow-up with PCP or here for further evaluation.  Patient discharged home, hemodynamically stable. ?Final Clinical Impressions(s) / UC Diagnoses  ? ?Final diagnoses:  ?Sore throat  ?Nausea  ? ? ? ?Discharge Instructions   ? ?  ?Advised patient that her rapid strep and COVID-19  were negative today.  Advised patient may take Zofran daily or as needed for nausea.  Advised we will follow-up with throat culture results once received.  Advised patient if symptoms worsen and/or unresolved please follow-up

## 2021-08-03 LAB — CULTURE, GROUP A STREP: Strep A Culture: NEGATIVE

## 2021-08-05 ENCOUNTER — Telehealth (HOSPITAL_COMMUNITY): Payer: 59 | Admitting: Psychiatry

## 2021-08-08 ENCOUNTER — Ambulatory Visit (INDEPENDENT_AMBULATORY_CARE_PROVIDER_SITE_OTHER): Payer: 59 | Admitting: Licensed Clinical Social Worker

## 2021-08-08 DIAGNOSIS — F331 Major depressive disorder, recurrent, moderate: Secondary | ICD-10-CM | POA: Diagnosis not present

## 2021-08-08 DIAGNOSIS — F411 Generalized anxiety disorder: Secondary | ICD-10-CM

## 2021-08-08 DIAGNOSIS — Z87828 Personal history of other (healed) physical injury and trauma: Secondary | ICD-10-CM | POA: Diagnosis not present

## 2021-08-08 DIAGNOSIS — F39 Unspecified mood [affective] disorder: Secondary | ICD-10-CM

## 2021-08-08 NOTE — Progress Notes (Signed)
Virtual Visit via Video Note ? ?I connected with [place] on 08/08/21 at 10:00 AM EDT by a video enabled telemedicine application and verified that I am speaking with the correct person using two identifiers. ? ?Location: ?Patient: work ?Provider: home office ? ?I discussed the limitations of evaluation and management by telemedicine and the availability of in person appointments. The patient expressed understanding and agreed to proceed. ?  ?I discussed the assessment and treatment plan with the patient. The patient was provided an opportunity to ask questions and all were answered. The patient agreed with the plan and demonstrated an understanding of the instructions. ?  ?The patient was advised to call back or seek an in-person evaluation if the symptoms worsen or if the condition fails to improve as anticipated. ? ?I provided 50 minutes of non-face-to-face time during this encounter. ? ?THERAPIST PROGRESS NOTE ? ?Session Time: 10:00 AM to 10:50 AM ? ?Participation Level: Active ? ?Behavioral Response: CasualAlertIrritable, also euthymi ? ?Type of Therapy: Individual Therapy ? ?Treatment Goals addressed: Managing stress help with binge eating, controlling moods, better able to communicate, self-esteem, coping ? ?ProgressTowards Goals: Progressing-patient processing feelings related to relationship stressors and exploring helpful coping, reinforced patient working on himself to encourage goal of self-esteem ? ?Interventions: Solution Focused, Strength-based, Reframing, and Other: coping ? ?Summary: Oved Palmberg is a 41 y.o. male who presents with he is doing ok. Life is fine some challenges with work and of course partner. They are fine until yesterday. Started writing a letter to explain his feelings. He has his partner there and patient has a partner here. Why they are having misunderstandings and what it makes it hard. Try to understand patient's thought process where having the most  difficulty. Not getting a lot of communication because of schedule and life. He did a [employer] Told him pictures were nice tasteful nude. One didn't like was intimate. Because told him didn't like it made him feel like not supporting him or understanding where he is coming from they are just friends don't need to be jealous. Asked if more pictures didn't know about. He said nothing yesterday saw another photo that was intimate on friend's social media site. Partner doesn't listen presses on and solve things right now. Patient said busy at work. Said let's talk about it later at work. He keeps talking about it and can't ignore him. Discussion and said had to go and not productive and got to a point of being upset. Partner said not listening inconsiderate and rude. Called him when going home from work. Don't want to talk when only have 5 minutes. Finally get in discussion of things when at home going back and forth on the subject. Not about the photos not jealous. More than photos asked if more and didn't tell patient about them. Started feeling like outsider and inside not get everything for him. Not listening to him. Partner says Issue that patient has not his problem to solve. Patient feels getting less time. Feeling on the outside. Not communicating everything to him. Will support him patient wants to be part of the process. Very heated. Don't know how to better explain to him. Thinks that his partner hears negative comment and then thinks he is a bad person. Affects him can't have negative judgement on him. Patient does not feel understood making an effort to understand him. Not trying to understand patient. Not listened to gets angry and hard to control angry. What has  sustained the relationship is not hiding anything. And now hiding something. Not supporting him not the issue. Buttons pushed not getting understanding name calling. Said patient being aggressive. Patient asks what did  you do to get him there? Can't see his part. His big thing is can't calmly talk with him and become aggressive with him. Both hit by partners and Dad patient says not going to hit you but so angry. Frustration of it. Work on anger have a lot of anger. Asked for space and he wants to fix it, he doesn't give patient the space to calm down and cool. Feels more lonely than him. Has a partner only sees during the day, alone during the evening sleeping by himself. Doesn't get intimacy would like. Doesn't understand harder on patient than him. That adds to the frustrations. Maybe occupy time more. Can get a 4 year degree that would occupy his time. Patient reviews his history his struggle with separation lonely then accept where he was at then  making friends. Last fall moving to [facility] store went [employer] lonely again. Fixing the real problem be by himself live with yourself. Reviewed session and patient says got a little of frustration out..Ways that being on his own beneficial.  ? ?Therapist reviewed symptoms, facilitated expression of thoughts and feeling processed patient's feelings related to frustrations in his relationship.  As therapist explored with patient gave her perspective that patient legitimately has a right to have his opinion and his partner needs to allow space for that same time difficult for anyone to hear criticism but his partner needs to the outpatient space for his own opinion.  Noted his partner working on hearing negative feedback but not taking it personal not a judgment of him.  Noted patient also feels not listened to in the relationship and his partner not hearing that.  Therapist validated him on this as well although related being more just passionate will help him be able to express himself and be heard.  Noted establishing some ground rules could be helpful when he needs space just know that so he can calm down his anger, not needing to address issues right in the moment.  Noted patient  dealing with loneliness and discussed best choice for him [place] for him to continue to work on himself looked himself to fulfill his needs patient understands this as beneficial for him, continued growth is a beneficial goal for him.  Therapist provided support and space to patient as he shared his thoughts and feelings in session. ?Suicidal/Homicidal: No ? ?Plan: Return again in 7 weeks.2.  Work on relationship issues, patient processed feelings in session work on [employer]ist as needed including distress tolerance sheet, self-esteem from anxiety book emotional regulation and trauma block ? ?Diagnosis: unspecified mood disorder, major depressive disorder, recurrent, moderate, history of trauma, generalized anxiety disorder ? ? ?Collaboration of Care: Other none needed ? ?Patient/Guardian was advised Release of Information must be obtained prior to any record release in order to collaborate their care with an outside provider. Patient/Guardian was advised if they have not already done so to contact the registration department to sign all necessary forms in order for [place] to release information regarding their care.  ? ?Consent: Patient/Guardian gives verbal consent for treatment and assignment of benefits for services provided during this visit. Patient/Guardian expressed understanding and agreed to proceed.  ? ?Cordella Register, LCSW ?08/08/2021 ? ?

## 2021-08-28 ENCOUNTER — Other Ambulatory Visit (HOSPITAL_COMMUNITY): Payer: Self-pay

## 2021-08-28 MED ORDER — LAMOTRIGINE 25 MG PO TABS: 75.0000 mg | ORAL_TABLET | Freq: Every day | ORAL | 1 refills | Status: DC

## 2021-09-09 ENCOUNTER — Ambulatory Visit (HOSPITAL_COMMUNITY)
Admission: EM | Admit: 2021-09-09 | Discharge: 2021-09-09 | Disposition: A | Discharge status: Home / Self Care | Payer: Managed Care, Other (non HMO) | Attending: Internal Medicine | Admitting: Internal Medicine

## 2021-09-09 ENCOUNTER — Other Ambulatory Visit: Payer: Self-pay

## 2021-09-09 ENCOUNTER — Encounter (HOSPITAL_COMMUNITY): Payer: Self-pay | Admitting: *Deleted

## 2021-09-09 DIAGNOSIS — J069 Acute upper respiratory infection, unspecified: Secondary | ICD-10-CM | POA: Diagnosis present

## 2021-09-09 DIAGNOSIS — R5383 Other fatigue: Secondary | ICD-10-CM | POA: Diagnosis present

## 2021-09-09 LAB — POCT URINALYSIS DIPSTICK, ED / UC
Bilirubin Urine: NEGATIVE
Glucose, UA: NEGATIVE mg/dL
Hgb urine dipstick: NEGATIVE
Ketones, ur: NEGATIVE mg/dL
Leukocytes,Ua: NEGATIVE
Nitrite: NEGATIVE
Protein, ur: NEGATIVE mg/dL
Specific Gravity, Urine: >=1.03 (ref 1.005–1.030)
Urobilinogen, UA: 0.2 mg/dL (ref 0.0–1.0)
pH: 5.5 (ref 5.0–8.0)

## 2021-09-09 LAB — POCT RAPID STREP A, ED / UC: Streptococcus, Group A Screen (Direct): NEGATIVE

## 2021-09-09 LAB — CBG MONITORING, ED: Glucose-Capillary: 101 mg/dL — ABNORMAL HIGH (ref 70–99)

## 2021-09-09 NOTE — ED Provider Notes (Signed)
[facility]    CSN: [id number] Arrival date & time: 09/09/21  1212      History   Chief Complaint Chief Complaint  Patient presents with   Fatigue    Experiencing lots of fatigue, headache. Dizziness, difficulty concentrating, chills, night sweats, slight pain on middle lower left back. - Entered by patient   Headache   Back Pain    HPI Alexander BeckersRafael Lopez Osborne is a 41 y.o. male.   Patient presents to urgent care for evaluation of his dizziness, fatigue, and headache that started 3 days ago and low back pain that started today. He also reports night sweats and chills for the last couple of nights. Has not taken any tylenol or ibuprofen for symptoms. Denies blurry vision/double vision, nausea, chest pain, shortness of breath, throat pain, and nasal congestion. Denies known sick contacts. He has been vaccinated for COVID-19, but cannot remember his last date of vaccination. No photophobia. States headache is a 4-5 on a scale of 0-10 at this time. Back pain started this morning and is to his left flank that is pulsating, constant, annoying, and at a 5 on a scale of 0-10.  He noticed his urine was very concentrated and dark 3 days ago, but this has improved since then.  He is not able to remember if he has been going to the bathroom more frequently, but denies urgency and burning with urination.  Pertinent history of type 2 diabetes.  He does not take any medications for glycemic control and his last A1c was 6.3 per patient.  He reports slight dizziness and states that his blood sugar was 158 this morning when he checked it.  He ate a good breakfast full of protein, but is still dizzy.  Denies any other aggravating or relieving factors at this time for symptoms.   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 Authorizing Provider  Emtricitabine-Tenofovir AF (DESCOVY PO) Take 1 Product by mouth. Pt unsure of dose at this time   Yes [provider]  albuterol (VENTOLIN HFA) 108 (90 Base) MCG/ACT inhaler Inhale 2 puffs into the lungs every 6 (six) hours as needed for wheezing or shortness of breath. 04/30/21   Everrett CoombeMatthews, Cody, DO  citalopram (CELEXA) 10 MG tablet TAKE 1 TABLET(10 MG) BY MOUTH DAILY 05/30/21   Thresa RossAkhtar, Nadeem, MD  Fexofenadine HCl (ALLEGRA PO) Take by mouth.    [provider]  lamoTRIgine (LAMICTAL) 25  MG tablet Take 3 tablets (75 mg total) by mouth daily. Take 3 a day 08/28/21   Thresa Ross, MD  loratadine (CLARITIN) 10 MG tablet Take 10 mg by mouth daily.    [provider]  mometasone (NASONEX) 50 MCG/ACT nasal spray Place 2 sprays into the nose daily.    [provider]  montelukast (SINGULAIR) 10 MG tablet Take 1 tablet (10 mg total) by mouth at bedtime. 06/25/21   Everrett Coombe, DO  Multiple Vitamins-Minerals (ONE-A-DAY MENS [employer]ALTH FORMULA) TABS Take 1 tablet by mouth daily with breakfast.    [provider]  omeprazole (PRILOSEC) 40  MG capsule Take 1 capsule by mouth daily. 12/04/20   [provider]  ondansetron (ZOFRAN-ODT) 8 MG disintegrating tablet Take 1 tablet (8 mg total) by mouth every 8 (eight) hours as needed for nausea or vomiting. 07/31/21   Trevor Iha, FNP  predniSONE (STERAPRED UNI-PAK 21 TAB) 10 MG (21) TBPK tablet Take by mouth daily. Take 6 tabs by mouth daily  for 2 days, then 5 tabs for 2 days, then 4 tabs for 2 days, then 3 tabs for 2 days, 2 tabs for 2 days, then 1 tab by mouth daily for 2 days 05/19/21   Ward, Tylene Fantasia, PA-C  sildenafil (VIAGRA) 50 MG tablet Take 50 mg by mouth daily as needed for erectile dysfunction.    [provider]  FLUoxetine (PROZAC) 40 MG capsule Take 1 capsule (40 mg total) by mouth daily. 11/02/20 11/26/20  Antonieta Pert, MD  traZODone (DESYREL) 50 MG tablet Take 50-100 mg by mouth at bedtime as needed. 11/21/20 01/23/21  [provider]    Family History Family History  Problem Relation Age of Onset   Cancer Mother 58       pancreatic cancer   Hypertension Father    Heart disease Father 23       CAD   Bipolar disorder Brother    Diabetes Sister    Allergic rhinitis Neg Hx    Angioedema Neg Hx    Asthma Neg Hx    Eczema Neg Hx    Immunodeficiency Neg Hx    Urticaria Neg Hx     Social History Social History   Tobacco Use   Smoking status: Former    Packs/day: 0.25    Years: 11.00    Pack years: 2.75    Types: Cigarettes    Quit date: 10/03/2017    Years since quitting: 3.9   Smokeless tobacco: Never  Vaping Use   Vaping Use: Never used  Substance Use Topics   Alcohol use: Yes    Alcohol/week: 13.0 standard drinks    Types: 12 Cans of beer, 1 Standard drinks or equivalent per week   Drug use: No     Allergies   Fish-derived products and Shellfish allergy   Review of Systems Review of Systems  Musculoskeletal:  Positive for back pain.  Neurological:  Positive for headaches.  Per HPI  Physical Exam Triage Vital  Signs ED Triage Vitals  Enc Vitals Group     BP 09/09/21 1313 (!) 142/89     Pulse Rate 09/09/21 1313 86     Resp 09/09/21 1313 18     Temp 09/09/21 1313 98.3 F (36.8 C)     Temp src --      SpO2 09/09/21 1313 97 %     Weight --      Height --      Head Circumference --  Peak Flow --      Pain Score 09/09/21 1311 5     Pain Loc --      Pain Edu? --      Excl. in GC? --    No data found.  Updated Vital Signs BP (!) 142/89   Pulse 86   Temp 98.3 F (36.8 C)   Resp 18   SpO2 97%   Visual Acuity Right Eye Distance:   Left Eye Distance:   Bilateral Distance:    Right Eye Near:   Left Eye Near:    Bilateral Near:     Physical Exam Vitals and nursing note reviewed.  Constitutional:      General: He is not in acute distress.    Appearance: Normal appearance. He is well-developed. He is not ill-appearing.     Comments: Very pleasant patient sitting comfortably on exam able in no acute distress.   HENT:     Head: Normocephalic and atraumatic.     Right Ear: Hearing, tympanic membrane, ear canal and external ear normal.     Left Ear: Hearing, tympanic membrane, ear canal and external ear normal.     Nose: Nose normal.     Mouth/Throat:     Mouth: Mucous membranes are moist.     Pharynx: Posterior oropharyngeal erythema present.  Eyes:     General: Lids are normal. Vision grossly intact. Gaze aligned appropriately. No visual field deficit.    Extraocular Movements: Extraocular movements intact.     Conjunctiva/sclera: Conjunctivae normal.     Right eye: Right conjunctiva is not injected.     Left eye: Left conjunctiva is not injected.  Cardiovascular:     Rate and Rhythm: Normal rate and regular rhythm.     Heart sounds: Normal heart sounds, S1 normal and S2 normal. No murmur heard. Pulmonary:     Effort: Pulmonary effort is normal. No respiratory distress.     Breath sounds: Normal breath sounds. No decreased air movement. No wheezing, rhonchi or rales.   Chest:     Chest wall: No tenderness.  Abdominal:     General: Abdomen is flat. Bowel sounds are normal. There is no distension.     Palpations: Abdomen is soft.     Tenderness: There is no abdominal tenderness. There is left CVA tenderness. There is no right CVA tenderness or guarding.  Musculoskeletal:        General: No swelling. Normal range of motion.     Cervical back: Normal range of motion and neck supple. No rigidity.     Right lower leg: No edema.     Left lower leg: No edema.  Lymphadenopathy:     Cervical: No cervical adenopathy.  Skin:    General: Skin is warm and dry.     Capillary Refill: Capillary refill takes less than 2 seconds.     Findings: No erythema or rash.  Neurological:     General: No focal deficit present.     Mental Status: He is alert and oriented to person, place, and time. Mental status is at baseline.     Cranial Nerves: Cranial nerves 2-12 are intact.     Sensory: Sensation is intact.     Motor: Motor function is intact.     Coordination: Coordination is intact.     Gait: Gait is intact.  Psychiatric:        Attention and Perception: Attention and perception normal.        Mood and Affect:  Mood normal.        Speech: Speech normal.        Behavior: Behavior normal. Behavior is cooperative.        Thought Content: Thought content normal.        Cognition and Memory: Cognition and memory normal.        Judgment: Judgment normal.     UC Treatments / Results  Labs (all labs ordered are listed, but only abnormal results are displayed) Labs Reviewed  CBG MONITORING, ED - Abnormal; Notable for the following components:      Result Value   Glucose-Capillary 101 (*)    All other components within normal limits  POCT URINALYSIS DIPSTICK, ED / UC  POCT RAPID STREP A, ED / UC    EKG   Radiology No results found.  Procedures Procedures (including critical care time)  Medications Ordered in UC Medications - No data to display  Initial  Impression / Assessment and Plan / UC Course  I have reviewed the triage vital signs and the nursing notes.  Pertinent labs & imaging results that were available during my care of the patient were reviewed by me and considered in my medical decision making (see chart for details).  Patient is a 41 year old male presenting to urgent care with fatigue, dizziness, and headache for the last 3 days.  He is a diabetic and states that his sugars have been running in the 150s as normal.  Normal food intake at home reported with no nausea/vomiting. No sore throat, but tonsils are enlarged today and patient reports subjective history of chills at home. Group A strep test negative in clinic today. Throat culture sent and pending. Urinalysis shows high specific gravity today. Patient states he has not been drinking a lot of water lately. No systemic signs of dehydration at this time. No clinical indication for IV rehydration as patient is tolerating PO liquid and food well without nausea/vomiting.  Deferred imaging today based on stable cardiopulmonary exam and vital signs.  Doubt mononucleosis cause at this time for patient's symptoms.  Patient likely has a viral illness that will respond well to supportive care.  Instructed patient to increase water intake to at least 64 ounces of water per day.  He may take Tylenol and Motrin every 6 hours as needed for fever, inflammation, and pain.   Counseled patient regarding appropriate use of medications and potential side effects for all medications recommended or prescribed today. Discussed red flag signs and symptoms of worsening condition,when to call the PCP office, return to urgent care, and when to seek higher level of care. Patient verbalizes understanding and agreement with plan. All questions answered. Patient discharged in stable condition.  Final Clinical Impressions(s) / UC Diagnoses   Final diagnoses:  Other fatigue  Viral upper respiratory infection      Discharge Instructions      Your symptoms are likely caused by viral illness that will respond well to supportive care.  Please increase your water intake to stay well-hydrated and eat a well-balanced diet.  You may take ibuprofen and Tylenol every 6 hours alternating for fever/chills at home.  Strep test was negative today.  Throat culture is pending.  You will receive a phone call if the throat culture grows any bacteria needing antibiotic treatment.  Your urinalysis showed that you are dehydrated today and was negative for a urinary tract infection.  Increase your fluid intake to at least 64 ounces of water per day.  If you  develop any new or worsening symptoms or do not improve in the next 2 to 3 days, please return.  If your symptoms are severe, please go to the emergency room.  Follow-up with your primary care provider for further evaluation and management of your symptoms as well as ongoing wellness visits.  I hope you feel better!     ED Prescriptions   None    PDMP not reviewed this encounter.   Carlisle Beers, [place] 09/09/21 [phone]

## 2021-09-09 NOTE — ED Triage Notes (Signed)
Pt reports fatigue,HA, that  started SAt. Lowerl t back pain started today. Pt also has chills and a hard time concentrating.

## 2021-09-09 NOTE — Discharge Instructions (Signed)
Your symptoms are likely caused by viral illness that will respond well to supportive care.  Please increase your water intake to stay well-hydrated and eat a well-balanced diet.  You may take ibuprofen and Tylenol every 6 hours alternating for fever/chills at home.  Strep test was negative today.  Throat culture is pending.  You will receive a phone call if the throat culture grows any bacteria needing antibiotic treatment.  Your urinalysis showed that you are dehydrated today and was negative for a urinary tract infection.  Increase your fluid intake to at least 64 ounces of water per day.  If you develop any new or worsening symptoms or do not improve in the next 2 to 3 days, please return.  If your symptoms are severe, please go to the emergency room.  Follow-up with your primary care provider for further evaluation and management of your symptoms as well as ongoing wellness visits.  I hope you feel better!

## 2021-09-10 ENCOUNTER — Encounter: Payer: Self-pay | Admitting: Family Medicine

## 2021-09-10 ENCOUNTER — Ambulatory Visit (INDEPENDENT_AMBULATORY_CARE_PROVIDER_SITE_OTHER): Payer: Managed Care, Other (non HMO) | Admitting: Family Medicine

## 2021-09-10 VITALS — BP 139/86 | HR 102 | Ht 70.0 in | Wt 243.0 lb

## 2021-09-10 DIAGNOSIS — J069 Acute upper respiratory infection, unspecified: Secondary | ICD-10-CM | POA: Diagnosis not present

## 2021-09-10 DIAGNOSIS — Z8639 Personal history of other endocrine, nutritional and metabolic disease: Secondary | ICD-10-CM | POA: Diagnosis not present

## 2021-09-10 DIAGNOSIS — R7303 Prediabetes: Secondary | ICD-10-CM | POA: Diagnosis not present

## 2021-09-10 LAB — POCT GLYCOSYLATED HEMOGLOBIN (HGB A1C): Hemoglobin A1C: 6 % — AB (ref 4.0–5.6)

## 2021-09-10 NOTE — Patient Instructions (Signed)
Let me know if symptoms worsen again.  Great work on improving blood sugars.  See me again in 3 months.

## 2021-09-10 NOTE — Progress Notes (Signed)
Alexander Osborne [place] - 41 y.o. male MRN [id number]  Date of birth: [birth year]  Subjective No chief complaint on file.   HPI Alexander Osborne is a 41 y.o. male here today with complaint of dizziness, fatigue and headache.  He has also had night sweats and chills. Symptoms started about 3 days ago.  Seen at urgent care yesterday and had rapid strep which was negative.  Strep culture was sent as well.  He is taking Descovy for PrEP.  He is prescribed this through [email or web address] Reports that he has increased his fluid intake and feels much better today.    He is concerned about his blood sugars.  CBG yesterday was 118.  Last a1c with [place] was 6.6%.    ROS:  A comprehensive ROS was completed and negative except as noted per HPI  Allergies  Allergen Reactions   Fish-Derived Products Other (See Comments)    Allergic, but reaction not recalled   Shellfish Allergy Other (See Comments)    Patient does not recall the reaction    Past Medical History:  Diagnosis Date   Allergy    Anxiety    Asthma    Depression    Diabetes mellitus without complication ([place])     Past Surgical History:  Procedure Laterality Date   corneal transplant left     EYE SURGERY      Social History   Socioeconomic History   Marital status: Legally Separated    Spouse name: Not on file   Number of children: 0   Years of education: Not on file   Highest education level: Not on file  Occupational History   Occupation: Freight forwarder    Employer: [employer]  Tobacco Use   Smoking status: Former    Packs/day: 0.25    Years: 11.00    Pack years: 2.75    Types: Cigarettes    Quit date: 10/03/2017    Years since quitting: 3.9   Smokeless tobacco: Never  Vaping Use   Vaping Use: Never used  Substance and Sexual Activity   Alcohol use: Yes    Alcohol/week: 13.0 standard drinks    Types: 12 Cans of beer, 1 Standard drinks or equivalent per week   Drug use: No   Sexual activity: Yes    Partners: Male     Birth control/protection: None    Comment: SS partner  Other Topics Concern   Not on file  Social History Narrative   Marital status: married x 4 years; happily married.       Lives; with husbnad      Employment: Librarian, academic at [employer]: socially; weekends      Alcohol: 1-3 drinks per week      Drugs: none      Exercise: none   Denies caffeine use    Social Determinants of Radio broadcast assistant Strain: Not on file  Food Insecurity: Not on file  Transportation Needs: Not on file  Physical Activity: Not on file  Stress: Not on file  Social Connections: Not on file    Family History  Problem Relation Age of Onset   Cancer Mother 12       pancreatic cancer   Hypertension Father    Heart disease Father 96       CAD   Bipolar disorder Brother    Diabetes Sister    Allergic rhinitis Neg Hx    Angioedema Neg Hx  Asthma Neg Hx    Eczema Neg Hx    Immunodeficiency Neg Hx    Urticaria Neg Hx     Health Maintenance  Topic Date Due   Hepatitis C Screening  Never done   OPHTHALMOLOGY EXAM  06/10/2018   FOOT EXAM  08/11/2018   URINE MICROALBUMIN  11/14/2018   COVID-19 Vaccine (4 - Booster for Moderna series) 04/09/2020   INFLUENZA VACCINE  11/19/2021   HEMOGLOBIN A1C  11/20/2021   TETANUS/TDAP  04/30/2027   HIV Screening  Completed   HPV VACCINES  Aged Out     ----------------------------------------------------------------------------------------------------------------------------------------------------------------------------------------------------------------- Physical Exam BP 139/86 (BP Location: Right Arm, Patient Position: Sitting, Cuff Size: Large)   Pulse (!) 102   Ht 5\' 10"  (1.778 m)   Wt 243 lb (110.2 kg)   SpO2 95%   BMI 34.87 kg/m   Physical Exam Constitutional:      Appearance: Normal appearance.  Eyes:     General: No scleral icterus. Cardiovascular:     Rate and Rhythm: Normal rate and regular rhythm.   Pulmonary:     Effort: Pulmonary effort is normal.     Breath sounds: Normal breath sounds.  Musculoskeletal:     Cervical back: Neck supple.  Neurological:     Mental Status: He is alert.  Psychiatric:        Mood and Affect: Mood normal.        Behavior: Behavior normal.    ------------------------------------------------------------------------------------------------------------------------------------------------------------------------------------------------------------------- Assessment and Plan  History of type 2 diabetes mellitus A1c down to 6.0%, improved from 6.6%.  He will continue to work on lifestyle change.  May be interested in adding GLP-1 in the future if blood sugars remain elevated. F/u in 3 months.   URI (upper respiratory infection) URI symptoms improved with hydration.  He is tested for HIV at regular intervals, recently completed test prior to starting Descovy.  He will let me know if symptoms worsen again.    No orders of the defined types were placed in this encounter.   No follow-ups on file.    This visit occurred during the SARS-CoV-2 public health emergency.  Safety protocols were in place, including screening questions prior to the visit, additional usage of staff PPE, and extensive cleaning of exam room while observing appropriate contact time as indicated for disinfecting solutions.

## 2021-09-10 NOTE — Assessment & Plan Note (Signed)
URI symptoms improved with hydration.  He is tested for HIV at regular intervals, recently completed test prior to starting Descovy.  He will let me know if symptoms worsen again.

## 2021-09-10 NOTE — Assessment & Plan Note (Signed)
A1c down to 6.0%, improved from 6.6%.  He will continue to work on lifestyle change.  May be interested in adding GLP-1 in the future if blood sugars remain elevated. F/u in 3 months.

## 2021-09-12 ENCOUNTER — Telehealth (HOSPITAL_COMMUNITY): Payer: Self-pay

## 2021-09-12 ENCOUNTER — Other Ambulatory Visit: Payer: Self-pay

## 2021-09-12 ENCOUNTER — Encounter (HOSPITAL_COMMUNITY): Payer: Self-pay

## 2021-09-12 ENCOUNTER — Emergency Department (HOSPITAL_COMMUNITY)
Admission: EM | Admit: 2021-09-12 | Discharge: 2021-09-12 | Disposition: A | Discharge status: Home / Self Care | Payer: Managed Care, Other (non HMO) | Attending: Emergency Medicine | Admitting: Emergency Medicine

## 2021-09-12 DIAGNOSIS — Z79899 Other long term (current) drug therapy: Secondary | ICD-10-CM | POA: Insufficient documentation

## 2021-09-12 DIAGNOSIS — T7840XA Allergy, unspecified, initial encounter: Secondary | ICD-10-CM

## 2021-09-12 DIAGNOSIS — L500 Allergic urticaria: Secondary | ICD-10-CM | POA: Diagnosis not present

## 2021-09-12 DIAGNOSIS — R21 Rash and other nonspecific skin eruption: Secondary | ICD-10-CM | POA: Diagnosis present

## 2021-09-12 DIAGNOSIS — F39 Unspecified mood [affective] disorder: Secondary | ICD-10-CM

## 2021-09-12 LAB — CULTURE, GROUP A STREP (THRC)

## 2021-09-12 MED ORDER — PREDNISONE 20 MG PO TABS: ORAL_TABLET | ORAL | 0 refills | Status: DC

## 2021-09-12 MED ORDER — EPINEPHRINE 0.3 MG/0.3ML IJ SOAJ: 0.3000 mg | INTRAMUSCULAR | 1 refills | Status: DC | PRN

## 2021-09-12 MED ORDER — FAMOTIDINE IN NACL 20-0.9 MG/50ML-% IV SOLN
20.0000 mg | INTRAVENOUS | Status: AC
  Administered 2021-09-12: 20 mg via INTRAVENOUS
  Filled 2021-09-12: qty 50

## 2021-09-12 MED ORDER — DIPHENHYDRAMINE HCL 50 MG/ML IJ SOLN
25.0000 mg | Freq: Once | INTRAMUSCULAR | Status: AC
  Administered 2021-09-12: 25 mg via INTRAVENOUS
  Filled 2021-09-12: qty 1

## 2021-09-12 MED ORDER — METHYLPREDNISOLONE SODIUM SUCC 125 MG IJ SOLR
125.0000 mg | Freq: Once | INTRAMUSCULAR | Status: AC
  Administered 2021-09-12: 125 mg via INTRAVENOUS
  Filled 2021-09-12: qty 2

## 2021-09-12 MED ORDER — CITALOPRAM HYDROBROMIDE 10 MG PO TABS: ORAL_TABLET | ORAL | 0 refills | Status: DC

## 2021-09-12 NOTE — Telephone Encounter (Signed)
Medication refill request - Fax from pt's [facility] on [address] requesting a new 90 day order of Citalopram, last ordered 05/30/21 and filled on 06/16/21.  Pt. currently does not have an appt scheduled. No showed 08/05/21 and cancelled on 07/30/21.  Last evaluated on 05/30/21.

## 2021-09-12 NOTE — ED Triage Notes (Signed)
Pt reports with an allergic reaction. Pt has hives to his arms and legs. Pt reports taking a Benadryl at 8 pm before bed after noticing some itching and SHOB. Pt showered thinking it would relieve his symptoms, but pt continues to itch.

## 2021-09-12 NOTE — Telephone Encounter (Signed)
A new 30 day one time order for patient's Citalopram e-scribed to patient's [facility] on [address] in [place] with instructions patient must make and appointment for further refills.   E-scribed order as authorized by Dr. Gilmore Laroche this date for #30 with no refills.

## 2021-09-12 NOTE — ED Notes (Signed)
Patients rash has significantly decreased and response to medications given presents to be effective at this time - patient denies SOB or difficulty breathing and states he is no longer itching

## 2021-09-12 NOTE — Discharge Instructions (Signed)
Take the prescribed medication as directed.  Can continue benadryl and pepcid with this if itching persists. If epi pen used, you need to come to the ED for evaluation/monitoring afterwards. Follow-up with your primary care doctor. Return to the ED for new or worsening symptoms.

## 2021-09-12 NOTE — ED Provider Notes (Signed)
[facility]-EMERGENCY DEPT Provider Note   CSN: [id number] Arrival date & time: 09/12/21  0034     History  Chief Complaint  Patient presents with   Allergic Reaction    Hercules Hasler is a 41 y.o. male.  The history is provided by the patient and medical records.  Allergic Reaction Presenting symptoms: rash    41 y.o. M presenting to the ED with allergic reaction.  States he ate [place] food for dinner, afterwards started to having itching/rash. He took a benadryl at 8PM, took a hot shower, and tried to lay down but symptoms worsening.  Denies any sensation of lip/tongue swelling, no difficulty swallowing.  Denies any changes in soaps, detergents, or other personal care products.  He does have shellfish allergy but denies having any seafood  Home Medications Prior to Admission medications   Medication Sig Start Date End Date Taking? Authorizing Provider  albuterol (VENTOLIN HFA) 108 (90 Base) MCG/ACT inhaler Inhale 2 puffs into the lungs every 6 (six) hours as needed for wheezing or shortness of breath. 04/30/21   Everrett Coombe, DO  Ascorbic Acid (VITAMIN C) 1000 MG tablet Take 1,000 mg by mouth daily.    [provider]  citalopram (CELEXA) 10 MG tablet TAKE 1 TABLET(10 MG) BY MOUTH DAILY 05/30/21   Thresa Ross, MD  DESCOVY 200-25 MG tablet Take 1 tablet by mouth daily. 09/04/21   [provider]  Fexofenadine HCl (ALLEGRA PO) Take by mouth.    [provider]  lamoTRIgine (LAMICTAL) 25 MG tablet Take 3 tablets (75 mg total) by mouth daily. Take 3 a day 08/28/21   Thresa Ross, MD  loratadine (CLARITIN) 10 MG tablet Take 10 mg by mouth daily.    [provider]  mometasone (NASONEX) 50 MCG/ACT nasal spray Place 2 sprays into the nose daily.    [provider]  montelukast (SINGULAIR) 10 MG tablet Take 1 tablet (10 mg total) by mouth at bedtime. 06/25/21   Everrett Coombe, DO  omeprazole (PRILOSEC) 40 MG capsule  Take 1 capsule by mouth daily. 12/04/20   [provider]  sildenafil (VIAGRA) 50 MG tablet Take 50 mg by mouth daily as needed for erectile dysfunction.    [provider]  FLUoxetine (PROZAC) 40 MG capsule Take 1 capsule (40 mg total) by mouth daily. 11/02/20 11/26/20  Antonieta Pert, MD  traZODone (DESYREL) 50 MG tablet Take 50-100 mg by mouth at bedtime as needed. 11/21/20 01/23/21  [provider]      Allergies    Fish-derived products and Shellfish allergy    Review of Systems   Review of Systems  Skin:  Positive for rash.  All other systems reviewed and are negative.  Physical Exam Updated Vital Signs BP (!) 170/111 (BP Location: Right Arm)   Pulse 99   Temp 98.4 F (36.9 C) (Oral)   Resp 18   SpO2 99%   Physical Exam Vitals and nursing note reviewed.  Constitutional:      Appearance: He is well-developed.  HENT:     Head: Normocephalic and atraumatic.     Mouth/Throat:     Comments: No lip or tongue swelling, handling secretions well, no stridor Eyes:     Conjunctiva/sclera: Conjunctivae normal.     Pupils: Pupils are equal, round, and reactive to light.  Cardiovascular:     Rate and Rhythm: Normal rate and regular rhythm.     Heart sounds: Normal heart sounds.  Pulmonary:  Effort: Pulmonary effort is normal.     Breath sounds: Normal breath sounds.  Abdominal:     General: Bowel sounds are normal.     Palpations: Abdomen is soft.  Musculoskeletal:        General: Normal range of motion.     Cervical back: Normal range of motion.  Skin:    General: Skin is warm and dry.     Findings: Rash present. Rash is urticarial.     Comments: Diffuse urticarial rash across all 4 extremities, torso, and back, sparing palms and soles  Neurological:     Mental Status: He is alert and oriented to person, place, and time.    ED Results / Procedures / Treatments   Labs (all labs ordered are listed, but only abnormal results are  displayed) Labs Reviewed - No data to display  EKG None  Radiology No results found.  Procedures Procedures    Medications Ordered in ED Medications  methylPREDNISolone sodium succinate (SOLU-MEDROL) 125 mg/2 mL injection 125 mg (125 mg Intravenous Given 09/12/21 0131)  famotidine (PEPCID) IVPB 20 mg premix (20 mg Intravenous New Bag/Given 09/12/21 0136)  diphenhydrAMINE (BENADRYL) injection 25 mg (25 mg Intravenous Given 09/12/21 0129)    ED Course/ Medical Decision Making/ A&P                           Medical Decision Making Risk Prescription drug management.   41 y.o. male presenting to the ED with allergic reaction that began after eating [place] takeout.  Took home Benadryl and hot shower without any acute change in symptoms.  He has diffuse urticarial rash on exam, sparing palms and soles.  He has no lip or tongue swelling, handling secretions well, no stridor.  No acute signs of anaphylaxis currently.  We will give IV Solu-Medrol, Benadryl, and Pepcid.  Will monitor closely.  3:18 AM Patient's rash improved.  No longer itching.  Remains without airway compromise.  Stable for discharge.  Unclear origin, possible cross contamination with takeout food.  Will d/c home with prednisone taper, epi pen.  He is aware of indications for use and need for monitoring afterwards.  Can follow-up with PCP.  Return here for new concerns.  Final Clinical Impression(s) / ED Diagnoses Final diagnoses:  Allergic reaction, initial encounter    Rx / DC Orders ED Discharge Orders          Ordered    predniSONE (DELTASONE) 20 MG tablet        09/12/21 0321    EPINEPHrine 0.3 mg/0.3 mL IJ SOAJ injection  As needed        09/12/21 0321              Garlon Hatchet, PA-C 09/12/21 0340    Tilden Fossa, MD 09/12/21 [phone]

## 2021-09-17 ENCOUNTER — Telehealth: Payer: Self-pay | Admitting: General Practice

## 2021-09-17 NOTE — Telephone Encounter (Signed)
Transition Care Management Unsuccessful Follow-up Telephone Call  Date of discharge and from where:  09/12/21 from [facility]  Attempts:  1st Attempt  Reason for unsuccessful TCM follow-up call:  Left voice message

## 2021-09-18 NOTE — Telephone Encounter (Signed)
Transition Care Management Unsuccessful Follow-up Telephone Call  Date of discharge and from where:  09/12/21 from [facility]  Attempts:  2nd Attempt  Reason for unsuccessful TCM follow-up call:  Left voice message

## 2021-09-23 NOTE — Telephone Encounter (Signed)
Transition Care Management Unsuccessful Follow-up Telephone Call  Date of discharge and from where:  09/12/21 from [facility]  Attempts:  3rd Attempt  Reason for unsuccessful TCM follow-up call:  Left voice message

## 2021-10-11 ENCOUNTER — Encounter (HOSPITAL_COMMUNITY): Payer: Self-pay

## 2021-10-11 ENCOUNTER — Ambulatory Visit (INDEPENDENT_AMBULATORY_CARE_PROVIDER_SITE_OTHER): Payer: 59 | Admitting: Licensed Clinical Social Worker

## 2021-10-11 ENCOUNTER — Ambulatory Visit (HOSPITAL_COMMUNITY): Payer: 59 | Admitting: Licensed Clinical Social Worker

## 2021-10-11 DIAGNOSIS — Z87828 Personal history of other (healed) physical injury and trauma: Secondary | ICD-10-CM

## 2021-10-11 DIAGNOSIS — F331 Major depressive disorder, recurrent, moderate: Secondary | ICD-10-CM | POA: Diagnosis not present

## 2021-10-11 DIAGNOSIS — F39 Unspecified mood [affective] disorder: Secondary | ICD-10-CM | POA: Diagnosis not present

## 2021-10-11 DIAGNOSIS — F411 Generalized anxiety disorder: Secondary | ICD-10-CM

## 2021-10-13 ENCOUNTER — Other Ambulatory Visit (HOSPITAL_COMMUNITY): Payer: Self-pay | Admitting: Psychiatry

## 2021-10-13 DIAGNOSIS — F39 Unspecified mood [affective] disorder: Secondary | ICD-10-CM

## 2021-10-22 ENCOUNTER — Telehealth: Payer: Self-pay | Admitting: Emergency Medicine

## 2021-10-22 ENCOUNTER — Ambulatory Visit
Admission: RE | Admit: 2021-10-22 | Discharge: 2021-10-22 | Disposition: A | Discharge status: Home / Self Care | Payer: Managed Care, Other (non HMO) | Source: Ambulatory Visit | Attending: Urgent Care | Admitting: Urgent Care

## 2021-10-22 VITALS — BP 132/78 | HR 95 | Temp 98.5°F | Resp 18

## 2021-10-22 DIAGNOSIS — J020 Streptococcal pharyngitis: Secondary | ICD-10-CM

## 2021-10-22 LAB — POCT RAPID STREP A (OFFICE): Rapid Strep A Screen: POSITIVE — AB

## 2021-10-22 MED ORDER — AMOXICILLIN-POT CLAVULANATE 875-125 MG PO TABS: 1.0000 | ORAL_TABLET | Freq: Two times a day (BID) | ORAL | 0 refills | Status: DC

## 2021-10-22 MED ORDER — NAPROXEN 500 MG PO TABS: 500.0000 mg | ORAL_TABLET | Freq: Two times a day (BID) | ORAL | 0 refills | Status: DC

## 2021-10-22 NOTE — ED Provider Notes (Signed)
[facility] - URGENT CARE CENTER   MRN: [id number] DOB: [birth year]  Subjective:   Alexander Osborne is a 41 y.o. male presenting for 2-day history of acute onset throat pain, painful swallowing.  Patient has a history of recurrent strep throat infections.  Has not talked with a specialist about removal of his tonsils.  No current facility-administered medications for this encounter.  Current Outpatient Medications:    albuterol (VENTOLIN HFA) 108 (90 Base) MCG/ACT inhaler, Inhale 2 puffs into the lungs every 6 (six) hours as needed for wheezing or shortness of breath., Disp: 8 g, Rfl: 0   Ascorbic Acid (VITAMIN C) 1000 MG tablet, Take 1,000 mg by mouth daily., Disp: , Rfl:    citalopram (CELEXA) 10 MG tablet, TAKE 1 TABLET(10 MG) BY MOUTH DAILY, Disp: 30 tablet, Rfl: 0   DESCOVY 200-25 MG tablet, Take 1 tablet by mouth daily., Disp: , Rfl:    EPINEPHrine 0.3 mg/0.3 mL IJ SOAJ injection, Inject 0.3 mg into the muscle as needed for anaphylaxis., Disp: 1 each, Rfl: 1   Fexofenadine HCl (ALLEGRA PO), Take 1 tablet by mouth daily., Disp: , Rfl:    lamoTRIgine (LAMICTAL) 25 MG tablet, Take 3 tablets (75 mg total) by mouth daily. Take 3 a day (Patient taking differently: Take 75 mg by mouth daily. Take 3 tablets a day), Disp: 90 tablet, Rfl: 1   mometasone (NASONEX) 50 MCG/ACT nasal spray, Place 2 sprays into the nose daily as needed (for allergies)., Disp: , Rfl:    omeprazole (PRILOSEC) 40 MG capsule, Take 1 capsule by mouth daily., Disp: , Rfl:    predniSONE (DELTASONE) 20 MG tablet, Take 40 mg by mouth daily for 3 days, then 20mg  by mouth daily for 3 days, then 10mg  daily for 3 days, Disp: 12 tablet, Rfl: 0   sildenafil (VIAGRA) 50 MG tablet, Take 50 mg by mouth daily as needed for erectile dysfunction., Disp: , Rfl:    Allergies  Allergen Reactions   Fish-Derived Products Other (See Comments)    Allergic, but reaction not recalled Confirmed thru testing   Shellfish Allergy Other  (See Comments)    Patient does not recall the reaction Confirmed thru testing    Past Medical History:  Diagnosis Date   Allergy    Anxiety    Asthma    Depression    Diabetes mellitus without complication (HCC)      Past Surgical History:  Procedure Laterality Date   corneal transplant left     EYE SURGERY      Family History  Problem Relation Age of Onset   Cancer Mother 29       pancreatic cancer   Hypertension Father    Heart disease Father 76       CAD   Bipolar disorder Brother    Diabetes Sister    Allergic rhinitis Neg Hx    Angioedema Neg Hx    Asthma Neg Hx    Eczema Neg Hx    Immunodeficiency Neg Hx    Urticaria Neg Hx     Social History   Tobacco Use   Smoking status: Former    Packs/day: 0.25    Years: 11.00    Total pack years: 2.75    Types: Cigarettes    Quit date: 10/03/2017    Years since quitting: 4.0   Smokeless tobacco: Never  Vaping Use   Vaping Use: Never used  Substance Use Topics   Alcohol use: Yes    Alcohol/week:  13.0 standard drinks of alcohol    Types: 12 Cans of beer, 1 Standard drinks or equivalent per week   Drug use: No    ROS   Objective:   Vitals: BP 132/78   Pulse 95   Temp 98.5 F (36.9 C)   Resp 18   SpO2 98%   Physical Exam Constitutional:      General: He is not in acute distress.    Appearance: Normal appearance. He is well-developed and normal weight. He is not ill-appearing, toxic-appearing or diaphoretic.  HENT:     Head: Normocephalic and atraumatic.     Right Ear: External ear normal.     Left Ear: External ear normal.     Nose: Nose normal.     Mouth/Throat:     Pharynx: Pharyngeal swelling and posterior oropharyngeal erythema present. No oropharyngeal exudate or uvula swelling.     Tonsils: No tonsillar exudate or tonsillar abscesses. 2+ on the right. 2+ on the left.  Eyes:     General: No scleral icterus.       Right eye: No discharge.        Left eye: No discharge.     Extraocular  Movements: Extraocular movements intact.  Cardiovascular:     Rate and Rhythm: Normal rate.  Pulmonary:     Effort: Pulmonary effort is normal.  Musculoskeletal:     Cervical back: Normal range of motion.  Neurological:     Mental Status: He is alert and oriented to person, place, and time.  Psychiatric:        Mood and Affect: Mood normal.        Behavior: Behavior normal.        Thought Content: Thought content normal.        Judgment: Judgment normal.     Results for orders placed or performed during the hospital encounter of 10/22/21 (from the past 24 hour(s))  POCT rapid strep A     Status: Abnormal   Collection Time: 10/22/21  3:41 PM  Result Value Ref Range   Rapid Strep A Screen Positive (A) Negative    Assessment and Plan :   PDMP not reviewed this encounter.  1. Strep pharyngitis    Will treat for strep pharyngitis.  Patient is to start Augmentin, use supportive care otherwise. Counseled patient on potential for adverse effects with medications prescribed/recommended today, ER and return-to-clinic precautions discussed, patient verbalized understanding.    Wallis Bamberg, [place] 10/22/21 1546

## 2021-10-22 NOTE — ED Triage Notes (Signed)
Pt here with sore throat x 2 days. Prone to strep throat.

## 2021-11-02 ENCOUNTER — Other Ambulatory Visit (HOSPITAL_COMMUNITY): Payer: Self-pay | Admitting: Psychiatry

## 2021-11-12 ENCOUNTER — Telehealth (HOSPITAL_COMMUNITY): Payer: Self-pay | Admitting: Psychiatry

## 2021-11-12 DIAGNOSIS — F39 Unspecified mood [affective] disorder: Secondary | ICD-10-CM

## 2021-11-12 MED ORDER — CITALOPRAM HYDROBROMIDE 10 MG PO TABS: ORAL_TABLET | ORAL | 0 refills | Status: DC

## 2021-11-12 NOTE — Telephone Encounter (Signed)
Patient called requesting appointment change for therapist tomorrow but none were available so he kept as is. Requested appointment with psychiatry provider which was scheduled.Also requested refill on Citalopram.   [id number] - [place], [place] - [address] Phone:  [phone]  Fax:  [phone]      Last completed appointment:  05/30/2021 No-Show 08/05/2021 Next appointment  12/10/2021

## 2021-11-13 ENCOUNTER — Ambulatory Visit (INDEPENDENT_AMBULATORY_CARE_PROVIDER_SITE_OTHER): Payer: 59 | Admitting: Licensed Clinical Social Worker

## 2021-11-13 DIAGNOSIS — F331 Major depressive disorder, recurrent, moderate: Secondary | ICD-10-CM

## 2021-11-13 DIAGNOSIS — F411 Generalized anxiety disorder: Secondary | ICD-10-CM | POA: Diagnosis not present

## 2021-11-13 DIAGNOSIS — Z87828 Personal history of other (healed) physical injury and trauma: Secondary | ICD-10-CM | POA: Diagnosis not present

## 2021-11-13 DIAGNOSIS — F39 Unspecified mood [affective] disorder: Secondary | ICD-10-CM | POA: Diagnosis not present
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
## 2021-11-20 ENCOUNTER — Other Ambulatory Visit: Payer: Self-pay | Admitting: Family Medicine

## 2021-11-27 ENCOUNTER — Ambulatory Visit (INDEPENDENT_AMBULATORY_CARE_PROVIDER_SITE_OTHER): Payer: 59 | Admitting: Licensed Clinical Social Worker

## 2021-11-27 DIAGNOSIS — Z87828 Personal history of other (healed) physical injury and trauma: Secondary | ICD-10-CM

## 2021-11-27 DIAGNOSIS — F331 Major depressive disorder, recurrent, moderate: Secondary | ICD-10-CM | POA: Diagnosis not present

## 2021-11-27 DIAGNOSIS — F411 Generalized anxiety disorder: Secondary | ICD-10-CM | POA: Diagnosis not present

## 2021-11-27 DIAGNOSIS — F39 Unspecified mood [affective] disorder: Secondary | ICD-10-CM | POA: Diagnosis not present

## 2021-11-27 NOTE — Progress Notes (Signed)
Virtual Visit via Video Note  I connected with Alexander Osborne on 11/27/21 at  9:00 AM EDT by a video enabled telemedicine application and verified that I am speaking with the correct person using two identifiers.  Location: Patient: stationary car Provider: home office   I discussed the limitations of evaluation and management by telemedicine and the availability of in person appointments. The patient expressed understanding and agreed to proceed.   I discussed the assessment and treatment plan with the patient. The patient was provided an opportunity to ask questions and all were answered. The patient agreed with the plan and demonstrated an understanding of the instructions.   The patient was advised to call back or seek an in-person evaluation if the symptoms worsen or if the condition fails to improve as anticipated.  I provided 42 minutes of non-face-to-face time during this encounter.  THERAPIST PROGRESS NOTE  Session Time: 9:00 AM to 9:42 AM  Participation Level: Active  Behavioral Response: CasualAlertEuthymic  Type of Therapy: Individual Therapy  Treatment Goals addressed: Managing stress help with binge eating, controlling moods, better able to communicate, self-esteem, coping   ProgressTowards Goals: Progressing-working on mood management and communication in session patient working on helpful coping strategies  Interventions: Solution Focused, Strength-based, Supportive, Reframing, and Other: Ship skills, coping  Summary: Alexander Osborne is a 41 y.o. male who presents with excited going to [place] miss partner and loves to travel. Has plans going to [place] for his birthday, three day party at water park, also has to study. Last session time trying to get back meds and now back on. It was starting to effect him not having a medication a week. On them  stable mood improved med works because off of them more irritated easily. Moody if not on meds. Bothers him that needs  the meds. Doesn't like to be on them doesn't like to be on medicine if not necessary.  Therapist shared her own perspective of not closing that door but working toward getting to a place where that would be possible. Patient says door not closed worry about getting better down the road a couple of years. One is for bipolar. Runs in the family. Mood stable life is calmer less sensitive and more rational. Moodiness interferes with general getting along. Partners have been patient with him. Can tell more hard headed but still an issue to work on even on meds. Partner is also hard headed. How he knows doesn't know until an argument. Trying to step away from argument. Last time tried assertive and said need space and let's talk later. [name] getting along better. He wants to resolve things right away. Usually patient like that.  Patient noted needing that space but [name] didn't give him space when he asked for it. Was a little drunk. Likes to get resolve things too frustrated and need his space. Patient approach with [name] is to resolve it now when [name] asks for space doesn't give it to him.  Therapist brought up the concept of awareness for emotional regulation red passage. Patient said went back to bad habits went back into smoking to deal with things. On meds can control things better more aware notice two nights ago did not give into temptation to smoke, haven't smoked, at that time total himself don't do it and didn't do it.  Working on being more aware of when being hard headed.  Should noted his approach of wanting to be right all the time. Feel right all the  time. Hard to work on knows it doesn't help. Equate being right as being heard. [name] says judging him use example from before to explain who what was happening was the same thing and he felt thrown it back in his face. Patient says not judging him use an example help understand what trying to express. Confusion being right and being heard way to show what trying to  say and demonstrate. Therapist questioned if right only way feel listened to. What makes sense to him doesn't make sense to other people. Mindset have to right to be heard.  Viewed session and patient said continue to work on being less hard headed be aware of when it happen step back and work on resolving something rather than being right.  Reviewed talking to his partner about it therapist encouraged that helpful in couple's work as we are in part working on relationships as well as feedback from partner helpful as a lot of growth and a good relationship can happen.  Therapist noted patient doing a lot better on medications and process with him about a little sad he may have to be on medications always.  Doctor had him focus and therapist as well lets focus on him getting better looking at these questions down the line after he stabilized patient also agreeing with that.  Noted still working on moodiness even though meds helping and encouraged patient with awareness as key strategy to help with this Patient participated in completion of treatment plan as we cultivate the scale we see the full picture gives [place] a window where we can intervene.  Assessed patient learn something from session to work on awareness.  Read from patient passage explaining we can cultivate this awareness and ourselves by regularly checking in with her body sensations, thoughts and feelings by making sure that her lifestyle is supporting a some ways we needed to I am by including some form of mindfulness in everyday activity.  Patient able to use example of awareness to help him not continued to smoke and therapist very enthusiastic about both using the skill and not continuing to smoke.  Looked at his attitude of being right in arguments and therapist encouraged patient to look at conflict different way so he does not become entrenched in his position underlying issues patient wants to be heard.  Noted being right can continue the  conflict instead to look at in terms of being heard may help work through issues.  Therapist added DBT concepts doing what works.  Once patient on attitude of being right is helpful.  Patient will practice and see if helpful will also tell partner for his input will be also helpful and working on this for himself and his partnership.  Suicidal/Homicidal: No  Plan: Return again in 2 weeks.2.  Work on moodiness, ways of medicating and relationship where it feels hard but not at the conflict, talk to his partner and get feedback about going on relationship and patient's own growth  Diagnosis: unspecified mood disorder, major depressive disorder, recurrent, moderate, history of trauma, generalized anxiety disorder  Collaboration of Care: Other none needed  Patient/Guardian was advised Release of Information must be obtained prior to any record release in order to collaborate their care with an outside provider. Patient/Guardian was advised if they have not already done so to contact the registration department to sign all necessary forms in order for [place] to release information regarding their care.   Consent: Patient/Guardian gives verbal consent for treatment and assignment  of benefits for services provided during this visit. Patient/Guardian expressed understanding and agreed to proceed.   Coolidge Breeze, LCSW 11/27/2021

## 2021-12-10 ENCOUNTER — Telehealth (INDEPENDENT_AMBULATORY_CARE_PROVIDER_SITE_OTHER): Payer: 59 | Admitting: Psychiatry

## 2021-12-10 ENCOUNTER — Encounter (HOSPITAL_COMMUNITY): Payer: Self-pay | Admitting: Psychiatry

## 2021-12-10 ENCOUNTER — Other Ambulatory Visit (HOSPITAL_COMMUNITY): Payer: Self-pay | Admitting: Psychiatry

## 2021-12-10 DIAGNOSIS — Z87828 Personal history of other (healed) physical injury and trauma: Secondary | ICD-10-CM

## 2021-12-10 DIAGNOSIS — F411 Generalized anxiety disorder: Secondary | ICD-10-CM | POA: Diagnosis not present

## 2021-12-10 DIAGNOSIS — F39 Unspecified mood [affective] disorder: Secondary | ICD-10-CM | POA: Diagnosis not present

## 2021-12-10 MED ORDER — CITALOPRAM HYDROBROMIDE 10 MG PO TABS: ORAL_TABLET | ORAL | 2 refills | Status: DC

## 2021-12-10 MED ORDER — LAMOTRIGINE 25 MG PO TABS: 75.0000 mg | ORAL_TABLET | Freq: Every day | ORAL | 1 refills | Status: DC

## 2021-12-10 NOTE — Progress Notes (Signed)
[facility] Follow Up Visit  Patient Identification: Alexander Osborne MRN:  [id number] Date of Evaluation:  12/10/2021 Referral Source: hospital discharge Chief Complaint:  follow up  depression, mood symptoms Visit Diagnosis:    ICD-10-CM   1. History of trauma  Z87.828     2. Unspecified mood (affective) disorder (HCC)  F39 citalopram (CELEXA) 10 MG tablet    3. GAD (generalized anxiety disorder)  F41.1       Virtual Visit via Video Note  I connected with Alexander Osborne on 12/10/21 at 10:30 AM EDT by a video enabled telemedicine application and verified that I am speaking with the correct person using two identifiers.  Location: Patient: home Provider: office   I discussed the limitations of evaluation and management by telemedicine and the availability of in person appointments. The patient expressed understanding and agreed to proceed.    I discussed the assessment and treatment plan with the patient. The patient was provided an opportunity to ask questions and all were answered. The patient agreed with the plan and demonstrated an understanding of the instructions.   The patient was advised to call back or seek an in-person evaluation if the symptoms worsen or if the condition fails to improve as anticipated.  I provided 15 minutes of non-face-to-face time during this encounter.     History of Present Illness: Patient is a 41  years old currently single male referred by hospital discharge with OD admission for one day  He works full-time at [employer]  Last seen in January but has been in therapy, ran low on meds felt moodiness but does well on meds . No rash Back on meds   Working on self growth    Aggravating factors; relationhship and job pressure can be  Modifying factors : relationship   Severity better    Past Psychiatric History: depression  Previous Psychotropic Medications: Yes   Substance Abuse History in the last 12 months:  Yes.     Consequences of Substance Abuse: Alcohol use not regular, but discussed its effect on depression  Past Medical History:  Past Medical History:  Diagnosis Date   Allergy    Anxiety    Asthma    Depression    Diabetes mellitus without complication (HCC)     Past Surgical History:  Procedure Laterality Date   corneal transplant left     EYE SURGERY      Family Psychiatric History: brother : bipolar, sister, depression  Family History:  Family History  Problem Relation Age of Onset   Cancer Mother 54       pancreatic cancer   Hypertension Father    Heart disease Father 40       CAD   Bipolar disorder Brother    Diabetes Sister    Allergic rhinitis Neg Hx    Angioedema Neg Hx    Asthma Neg Hx    Eczema Neg Hx    Immunodeficiency Neg Hx    Urticaria Neg Hx     Social History:   Social History   Socioeconomic History   Marital status: Legally Separated    Spouse name: Not on file   Number of children: 0   Years of education: Not on file   Highest education level: Not on file  Occupational History   Occupation: Production designer, theatre/television/film    Employer: [employer]  Tobacco Use   Smoking status: Former    Packs/day: 0.25    Years: 11.00    Total  pack years: 2.75    Types: Cigarettes    Quit date: 10/03/2017    Years since quitting: 4.1   Smokeless tobacco: Never  Vaping Use   Vaping Use: Never used  Substance and Sexual Activity   Alcohol use: Yes    Alcohol/week: 13.0 standard drinks of alcohol    Types: 12 Cans of beer, 1 Standard drinks or equivalent per week   Drug use: No   Sexual activity: Yes    Partners: Male    Birth control/protection: None    Comment: SS partner  Other Topics Concern   Not on file  Social History Narrative   Marital status: married x 4 years; happily married.       Lives; with husbnad      Employment: Building control surveyor at [employer]      Tobacco: socially; weekends      Alcohol: 1-3 drinks per week      Drugs: none       Exercise: none   Denies caffeine use    Social Determinants of Corporate investment banker Strain: Not on file  Food Insecurity: Not on file  Transportation Needs: Not on file  Physical Activity: Not on file  Stress: Not on file  Social Connections: Not on file     Allergies:   Allergies  Allergen Reactions   Fish-Derived Products Other (See Comments)    Allergic, but reaction not recalled Confirmed thru testing   Shellfish Allergy Other (See Comments)    Patient does not recall the reaction Confirmed thru testing    Metabolic Disorder Labs: Lab Results  Component Value Date   HGBA1C 6.0 (A) 09/10/2021   MPG 143 05/23/2021   MPG 120 03/04/2021   No results found for: "PROLACTIN" Lab Results  Component Value Date   CHOL 198 05/23/2021   TRIG 131 05/23/2021   HDL 48 05/23/2021   CHOLHDL 4.1 05/23/2021   VLDL 25 11/02/2020   LDLCALC 125 (H) 05/23/2021   LDLCALC 114 (H) 03/04/2021   Lab Results  Component Value Date   TSH 1.06 05/23/2021    Therapeutic Level Labs: No results found for: "LITHIUM" No results found for: "CBMZ" No results found for: "VALPROATE"  Current Medications: Current Outpatient Medications  Medication Sig Dispense Refill   albuterol (VENTOLIN HFA) 108 (90 Base) MCG/ACT inhaler INHALE 2 PUFFS INTO THE LUNGS EVERY 6 HOURS AS NEEDED FOR WHEEZING OR SHORTNESS OF BREATH 6.7 g 0   amoxicillin-clavulanate (AUGMENTIN) 875-125 MG tablet Take 1 tablet by mouth 2 (two) times daily. 20 tablet 0   Ascorbic Acid (VITAMIN C) 1000 MG tablet Take 1,000 mg by mouth daily.     citalopram (CELEXA) 10 MG tablet TAKE 1 TABLET(10 MG) BY MOUTH DAILY 30 tablet 2   DESCOVY 200-25 MG tablet Take 1 tablet by mouth daily.     EPINEPHrine 0.3 mg/0.3 mL IJ SOAJ injection Inject 0.3 mg into the muscle as needed for anaphylaxis. 1 each 1   Fexofenadine HCl (ALLEGRA PO) Take 1 tablet by mouth daily.     lamoTRIgine (LAMICTAL) 25 MG tablet Take 3 tablets (75 mg total) by  mouth daily. Take 3 a day 90 tablet 1   mometasone (NASONEX) 50 MCG/ACT nasal spray Place 2 sprays into the nose daily as needed (for allergies).     naproxen (NAPROSYN) 500 MG tablet Take 1 tablet (500 mg total) by mouth 2 (two) times daily with a meal. 30 tablet 0   omeprazole (PRILOSEC) 40  MG capsule Take 1 capsule by mouth daily.     predniSONE (DELTASONE) 20 MG tablet Take 40 mg by mouth daily for 3 days, then 20mg  by mouth daily for 3 days, then 10mg  daily for 3 days 12 tablet 0   sildenafil (VIAGRA) 50 MG tablet Take 50 mg by mouth daily as needed for erectile dysfunction.     No current facility-administered medications for this visit.     Psychiatric Specialty Exam: Review of Systems  Cardiovascular:  Negative for chest pain.  Skin:  Negative for rash.  Psychiatric/Behavioral:  Negative for agitation, decreased concentration, hallucinations and self-injury.     There were no vitals taken for this visit.There is no height or weight on file to calculate BMI.  General Appearance: Casual  Eye Contact:  Good  Speech:  Clear and Coherent  Volume:  Normal  Mood: improving  Affect:  Congruent  Thought Process:  Goal Directed  Orientation:  Full (Time, Place, and Person)  Thought Content:  Rumination  Suicidal Thoughts:  No  Homicidal Thoughts:  No  Memory:  Immediate;   Fair  Judgement:  Fair  Insight:  Fair  Psychomotor Activity:  Normal  Concentration:  Concentration: Fair  Recall:  Good  Fund of Knowledge:Good  Language: Good  Akathisia:  No  Handed:   AIMS (if indicated):  not done  Assets:  Desire for Improvement Physical Health  ADL's:  Intact  Cognition: WNL  Sleep:  Fair   Screenings: AIMS    Flowsheet Row [facility]  AIMS Total Score 0      AUDIT    Flowsheet Row [facility]  Alcohol Use Disorder  Identification Test Final Score (AUDIT) 3      GAD-7    Flowsheet Row Office Visit from 03/04/2021 in [facility] Office Visit from 10/18/2020 in [facility]  Total GAD-7 Score 6 7      PHQ2-9    Flowsheet Row Office Visit from 09/10/2021 in [facility] Office Visit from 03/04/2021 in [facility] Counselor from 01/11/2021 in [facility] [place] Video Visit from 11/26/2020 in [facility] [place] Office Visit from 10/18/2020 in [facility]  PHQ-2 Total Score 0 4 6 1 4   PHQ-9 Total Score -- 9 12 -- 13      Flowsheet Row Video Visit from 12/10/2021 in [facility] [place] ED from 10/22/2021 in [facility] ED from 09/12/2021 in [facility]  C-SSRS RISK CATEGORY No Risk No Risk No Risk       Assessment and Plan: as follows  Prior documentation reviewed   Major depressive disorder recurrent moderate to severe: does better when on meds, will continue discussed compliance and if needed will work on lowering dose next year    Generalized anxiety disorder manageable continue celexa  Fu 36m. Renewed meds [facility], MD 8/22/202310:41 AM

## 2021-12-11 ENCOUNTER — Ambulatory Visit (INDEPENDENT_AMBULATORY_CARE_PROVIDER_SITE_OTHER): Payer: 59 | Admitting: Licensed Clinical Social Worker

## 2021-12-11 DIAGNOSIS — Z87828 Personal history of other (healed) physical injury and trauma: Secondary | ICD-10-CM | POA: Diagnosis not present

## 2021-12-11 DIAGNOSIS — F39 Unspecified mood [affective] disorder: Secondary | ICD-10-CM

## 2021-12-11 DIAGNOSIS — F331 Major depressive disorder, recurrent, moderate: Secondary | ICD-10-CM

## 2021-12-11 DIAGNOSIS — F411 Generalized anxiety disorder: Secondary | ICD-10-CM | POA: Diagnosis not present

## 2021-12-11 NOTE — Progress Notes (Signed)
Virtual Visit via Video Note  I connected with Alexander Osborne on 12/11/21 at 10:00 AM EDT by a video enabled telemedicine application and verified that I am speaking with the correct person using two identifiers.  Location: Patient: home Provider: home office   I discussed the limitations of evaluation and management by telemedicine and the availability of in person appointments. The patient expressed understanding and agreed to proceed.   I discussed the assessment and treatment plan with the patient. The patient was provided an opportunity to ask questions and all were answered. The patient agreed with the plan and demonstrated an understanding of the instructions.   The patient was advised to call back or seek an in-person evaluation if the symptoms worsen or if the condition fails to improve as anticipated.  I provided 46 minutes of non-face-to-face time during this encounter.  THERAPIST PROGRESS NOTE  Session Time: 10:00 AM to 10:46 AM  Participation Level: Active  Behavioral Response: CasualAlertDysphoric and Euthymic  Type of Therapy: Individual Therapy  Treatment Goals addressed: Managing stress help with binge eating, controlling moods, better able to communicate, self-esteem, coping  ProgressTowards Goals: Progressing-patient working on self growth particularly healthy relationships, effective communication, finding balance  Interventions: Solution Focused, Play Therapy, Supportive, Reframing, and Other: coping  Summary: Alexander Osborne is a 41 y.o. male who presents with really good but a little sad. Alexander Osborne and patient broke up. Let him know from beginning what it was, knew he had issues because of being involved with other people. He had known that and supposedly ok with it. If patient goes out  with a friend he seemed to say things that are kind of "purposeful" if he gets hurt throw it back at patient. He would say fine and show not fine show ways he wasn't  fine-hurt patient purposely and not realize not doing it. Doing things out of spite patient point out and he doesn't  understand. "Very immature" He is getting attached thought of breaking up will hurt later he didn't want that. But realized he didn't want this. He was sad going to [place] precipitated to one thing after another realizing doesn't want this. Sad liked him but Alexander Osborne realized not for him.  Therapist talked about some of the potential hazards that could Osborne up with this type of relationship and patient says enjoying it. He is human and can be jealous. Ultimately want him to be happy and patient has to deal with his feelings related to this loss. The guy Alexander Osborne who is here brings up jealousy a lot patient so annoyed so said you need to deal with it so now Alexander Osborne is afraid to talk about. Now want him to talk so patient wants to be less hard headed and help him work through it. Alexander Osborne feels like invested time in him but reality is goes home to his partner. Kind of sad with break up a feeling came up really does give heart and soul to people patient lets in. Thought wow this not working out if losing more boyfriends may stay single. End up being single.  Therapist challenged him on that said more likely people have problems if he is seeing more than 1 person not everybody is going to be able to handle that. The intention is Alexander Osborne to move here or patient move there. Wanted to move from [place] for a long time been here 22 years. Feel ready for a change, a change scenery, wants to live outside the country.  That is exciting for him. Probably haven't moved wants to set himself up finish degree opportunities to support himself. Studying information technology places want to live would be helpful.  Reviewed what were working on and patient said still working on him having to be right all the time. Not working well. It is hard. Realizes when interacted with employee can Osborne off  not listening, too strong and  employee closed herself off. Noted patient ended up being wrong. Because patient felt not  listened to or understood didn't realize why had to be right. Now thinks it could be patient not listening to them not  processing what they are saying. Sensation of not listened to so he closed off so they are closed off. Rethinking things closed off what has he closed off to? Maybe some arguments didn't have to happen. Left Alexander Osborne problem sticking up for herself draw line and forgetting about the other person. Basic attitude do what I want. So overboard forgetting about other people given so much in past relationship don't want to fall into that. Goes to extremes before get in middle. Has all his life swings in moods. Know in extremes interaction with people to know where he was and to get out of extremes. Deal with it isn't working trying to listen to Alexander Osborne more. Patient says feels exception to things. Makes him not listen hear but not true because defending his position wired differently in way things make sense to him. Majority don't understand. Work on control being not right all the time.   Patient talked about being sad with break-up and therapist brought up the challenges that we will surface in relationships where people are seeing different people not that it cannot work but for example in this case with his partner Alexander Osborne not can work for him.  Therapist noted and patient agrees being human jealousy's are going to appear although as we explore relationship for patient it is working for him now.  Continue to work with patient not being so closed off to listening other points of view not having to be right noting often we do not have full perspective things so it is good not to close off and to listen to other perspectives so we are not so close minded.  Discussed less black-and-white patient notes he goes in extremes needing to work to be less extreme therapist added words to describe what we are talking about more  humility noting when we put our minds together sometimes what we produces better than what we have alone.  About patient's thoughts about moving therapist excited for him thinking this will really enhance his life also good that he is getting a career together to help him when he wants to make a move.  Talked about patient wanting to be the exception and both the positive and negative of that both are true at the same time that he is unique at the same time can lead to air against.  Patient recognize his reactions often Osborne from last relationship where he sacrificed too much does not want to fall into that pattern, seeing the connection with being right and being listened to.  Noted not going to the extreme and reaction to [name] is that we will run into problems in his relationship such as telling partners deal with it.  Therapist provided space and support for patient to talk about thoughts and feelings in session.      Suicidal/Homicidal: No  Plan: Return again in 1 weeks.2.  Work on self growth includes things such as not being right all the time reacting in extremes, so using sessions to process thoughts and feelings  Diagnosis: unspecified mood disorder, major depressive disorder, recurrent, moderate, history of trauma, generalized anxiety disorder  Collaboration of Care: Review of Dr. Gilmore Laroche last note  Patient/Guardian was advised Release of Information must be obtained prior to any record release in order to collaborate their care with an outside provider. Patient/Guardian was advised if they have not already done so to contact the registration department to sign all necessary forms in order for [place] to release information regarding their care.   Consent: Patient/Guardian gives verbal consent for treatment and assignment of benefits for services provided during this visit. Patient/Guardian expressed understanding and agreed to proceed.   Coolidge Breeze, [place] 12/11/2021

## 2021-12-13 ENCOUNTER — Encounter: Payer: Self-pay | Admitting: Family Medicine

## 2021-12-13 ENCOUNTER — Ambulatory Visit (INDEPENDENT_AMBULATORY_CARE_PROVIDER_SITE_OTHER): Payer: Managed Care, Other (non HMO) | Admitting: Family Medicine

## 2021-12-13 VITALS — BP 127/84 | HR 73 | Ht 70.0 in | Wt 247.0 lb

## 2021-12-13 DIAGNOSIS — Z8639 Personal history of other endocrine, nutritional and metabolic disease: Secondary | ICD-10-CM | POA: Diagnosis not present

## 2021-12-13 DIAGNOSIS — E119 Type 2 diabetes mellitus without complications: Secondary | ICD-10-CM

## 2021-12-13 LAB — POCT UA - MICROALBUMIN
Albumin/Creatinine Ratio, Urine, POC: <30
Creatinine, POC: 300 mg/dL
Microalbumin Ur, POC: 30 mg/L

## 2021-12-13 LAB — POCT GLYCOSYLATED HEMOGLOBIN (HGB A1C): HbA1c, POC (controlled diabetic range): 6.7 % (ref 0.0–7.0)

## 2021-12-13 MED ORDER — OZEMPIC (0.25 OR 0.5 MG/DOSE) 2 MG/3ML ~~LOC~~ SOPN: PEN_INJECTOR | SUBCUTANEOUS | 3 refills | Status: DC

## 2021-12-13 NOTE — Patient Instructions (Signed)
Start ozempic 0.25mg  weekly x4 weeks then increase to 0.5mg  weekly.   See me again in 3-4 months.

## 2021-12-13 NOTE — Assessment & Plan Note (Addendum)
A1c increased since last visit to 6.7%.  Recommend adding ozempic to help with blood sugars and weight management.  Continue ot work on diet and exercise.  F/u in 3-4 months.

## 2021-12-13 NOTE — Progress Notes (Signed)
Alexander Osborne - 41 y.o. male MRN [id number]  Date of birth: 09/30/1980  Subjective Chief Complaint  Patient presents with   Diabetes    HPI Alexander Osborne is a 41 y.o. male here today for follow up.  Elevated blood sugars previously with A1c up to 6.6% six months ago. Improved with diet and lifestyle change ot 6.0% three months ago.  Has continued to work on lifestyle changes.  Denies symptoms at this time including increased thirst or urination.    ROS:  A comprehensive ROS was completed and negative except as noted per HPI   Allergies  Allergen Reactions   Fish-Derived Products Other (See Comments)    Allergic, but reaction not recalled Confirmed thru testing   Shellfish Allergy Other (See Comments)    Patient does not recall the reaction Confirmed thru testing    Past Medical History:  Diagnosis Date   Allergy    Anxiety    Asthma    Depression    Diabetes mellitus without complication (HCC)     Past Surgical History:  Procedure Laterality Date   corneal transplant left     EYE SURGERY      Social History   Socioeconomic History   Marital status: Legally Separated    Spouse name: Not on file   Number of children: 0   Years of education: Not on file   Highest education level: Not on file  Occupational History   Occupation: Production designer, theatre/television/film    Employer: [employer]  Tobacco Use   Smoking status: Former    Packs/day: 0.25    Years: 11.00    Total pack years: 2.75    Types: Cigarettes    Quit date: 10/03/2017    Years since quitting: 4.1   Smokeless tobacco: Never  Vaping Use   Vaping Use: Never used  Substance and Sexual Activity   Alcohol use: Yes    Alcohol/week: 13.0 standard drinks of alcohol    Types: 12 Cans of beer, 1 Standard drinks or equivalent per week   Drug use: No   Sexual activity: Yes    Partners: Male    Birth control/protection: None    Comment: SS partner  Other Topics Concern   Not on file  Social History Narrative    Marital status: married x 4 years; happily married.       Lives; with husbnad      Employment: Building control surveyor at [employer]      Tobacco: socially; weekends      Alcohol: 1-3 drinks per week      Drugs: none      Exercise: none   Denies caffeine use    Social Determinants of Corporate investment banker Strain: Not on file  Food Insecurity: Not on file  Transportation Needs: Not on file  Physical Activity: Not on file  Stress: Not on file  Social Connections: Not on file    Family History  Problem Relation Age of Onset   Cancer Mother 80       pancreatic cancer   Hypertension Father    Heart disease Father 6       CAD   Bipolar disorder Brother    Diabetes Sister    Allergic rhinitis Neg Hx    Angioedema Neg Hx    Asthma Neg Hx    Eczema Neg Hx    Immunodeficiency Neg Hx    Urticaria Neg Hx     Health Maintenance  Topic Date  Due   Hepatitis C Screening  Never done   OPHTHALMOLOGY EXAM  06/10/2018   Diabetic kidney evaluation - Urine ACR  11/14/2018   INFLUENZA VACCINE  11/19/2021   COVID-19 Vaccine (4 - Moderna risk series) 12/20/2021 (Originally 04/09/2020)   HEMOGLOBIN A1C  03/13/2022   Diabetic kidney evaluation - GFR measurement  05/23/2022   FOOT EXAM  12/14/2022   TETANUS/TDAP  04/30/2027   HIV Screening  Completed   HPV VACCINES  Aged Out     ----------------------------------------------------------------------------------------------------------------------------------------------------------------------------------------------------------------- Physical Exam BP 127/84 (BP Location: Right Arm, Patient Position: Sitting, Cuff Size: Large)   Pulse 73   Ht 5\' 10"  (1.778 m)   Wt 247 lb (112 kg)   SpO2 97%   BMI 35.44 kg/m   Physical Exam Constitutional:      Appearance: Normal appearance.  Neurological:     Mental Status: He is alert.  Psychiatric:        Mood and Affect: Mood normal.        Behavior: Behavior normal.      ------------------------------------------------------------------------------------------------------------------------------------------------------------------------------------------------------------------- Assessment and Plan  History of type 2 diabetes mellitus A1c increased since last visit to 6.7%.  Recommend adding ozempic to help with blood sugars and weight management.  Continue ot work on diet and exercise.  F/u in 3-4 months.    No orders of the defined types were placed in this encounter.   No follow-ups on file.    This visit occurred during the SARS-CoV-2 public health emergency.  Safety protocols were in place, including screening questions prior to the visit, additional usage of staff PPE, and extensive cleaning of exam room while observing appropriate contact time as indicated for disinfecting solutions.

## 2021-12-18 ENCOUNTER — Ambulatory Visit (INDEPENDENT_AMBULATORY_CARE_PROVIDER_SITE_OTHER): Payer: 59 | Admitting: Licensed Clinical Social Worker

## 2021-12-18 DIAGNOSIS — F39 Unspecified mood [affective] disorder: Secondary | ICD-10-CM

## 2021-12-18 DIAGNOSIS — F411 Generalized anxiety disorder: Secondary | ICD-10-CM | POA: Diagnosis not present

## 2021-12-18 DIAGNOSIS — Z87828 Personal history of other (healed) physical injury and trauma: Secondary | ICD-10-CM

## 2021-12-18 DIAGNOSIS — F331 Major depressive disorder, recurrent, moderate: Secondary | ICD-10-CM | POA: Diagnosis not present

## 2021-12-18 NOTE — Progress Notes (Signed)
Virtual Visit via Video Note  I connected with Alexander Osborne on 12/18/21 at 10:00 AM EDT by a video enabled telemedicine application and verified that I am speaking with the correct person using two identifiers.  Location: Patient: home Provider: home office   I discussed the limitations of evaluation and management by telemedicine and the availability of in person appointments. The patient expressed understanding and agreed to proceed.   I discussed the assessment and treatment plan with the patient. The patient was provided an opportunity to ask questions and all were answered. The patient agreed with the plan and demonstrated an understanding of the instructions.   The patient was advised to call back or seek an in-person evaluation if the symptoms worsen or if the condition fails to improve as anticipated.  I provided 42 minutes of non-face-to-face time during this encounter.  THERAPIST PROGRESS NOTE  Session Time: 10:00 AM to 10:42 AM  Participation Level: Active  Behavioral Response: CasualAlertAnxious and Euthymic  Type of Therapy: Individual Therapy  Treatment Goals addressed: Managing stress help with binge eating, controlling moods, better able to communicate, self-esteem, coping  ProgressTowards Goals: Progressing-can actively working on coping relationships challenging distortions in thinking by reviewing with therapist for her perspective helping him gain insight things that are accurate that he may need to work on  Interventions: CBT, Solution Focused, Strength-based, Supportive, Reframing, and Other: coping  Summary: Alexander Osborne is a 41 y.o. male who presents with talking about his dreams. Thinks overanalyzing a situation in his feelings a little bit. Made best friend last year only gay best friend don't have friends like that. Friends with his two friends. One of them made a comment on [employer]. Patient thought it was off asked this person about it. Not  invited for plans with this person and started to feel weird. Not sure if situation where overthinking or may not want him around. Invited because of Eliseo Gum mutual friend thinks. He is not reaching out anymore so patient does not feel like being around him. No problems don't want be around the person. Sore spot problem making friends. Friends make don't reach out to him when lived with [place] but reached out to [place]. Don't have friends come around the same way. Doesn't know if he doesn't make good impression, not interested in making friends with him, don't have a lot of best friends. Chewy and Marcelle Smiling don't invite with other people unless life events. Patient feels like it is him. Other people he knows get invited to things all the time.  Therapist challenged him on this has developed social skills and moodiness. Partners but more issues with friendships. Down to two boyfriends. [name] and his partner of 10 years decided to be friends. Reason why [name] left patient. Figured out doesn't want to be with a person who has many partners. Patient has fear he got left he is going to leave patient. So much change how is going to change. [name] assured them strong as ever.  Up is talked about the nature of anxiety and patient says some anxiety is shutting down don't hear anything blank expression not here anymore. Don't care and quiet.  Best talked about the nature of anxiety is Art therapist reviewed session and patient said don't take things as personally.   Work with patient on interpretation of different interactions and relationships therapist noted ways that its not him with human reactions people are motivated by many different things can be selfish can be insecure not  to personalize think it is about him therapist adding her impression that likes engagement with patient feels its positive as an outside source to give perspective.  Did the who cares as helpful to develop and like to deal with some of the difficulties  things we learned are not as big a deal as we think some people might not like [place], we might not be the favorite it just the way it is going to be.  This like things about how patient handled situation his directedness finding what was going on, talked about partner relationships has less partners patient insecure with changes and therapist said natural but also go with evidence reassured by partner also with patient's own feeling of how things are going.  Therapist said do not fall into the trap of anxiety thinking the worst case scenario which is a distortion.  Talked about anxiety being fighter flight patient shutting down can be a reaction to this noted ways to get out is to verbalize how you are feeling, deep breathe do grounding.  Therapist provided active listening open questions supportive interventions.  Suicidal/Homicidal: No  Plan: Return again in 5 weeks.2.Work on self growth includes things such as not being right all the time reacting in extremes, so using sessions to process thoughts and feelings  Diagnosis: unspecified mood disorder, major depressive disorder, recurrent, moderate, history of trauma, generalized anxiety disorder  Collaboration of Care: Other none needed  Patient/Guardian was advised Release of Information must be obtained prior to any record release in order to collaborate their care with an outside provider. Patient/Guardian was advised if they have not already done so to contact the registration department to sign all necessary forms in order for [place] to release information regarding their care.   Consent: Patient/Guardian gives verbal consent for treatment and assignment of benefits for services provided during this visit. Patient/Guardian expressed understanding and agreed to proceed.   Coolidge Breeze, LCSW 12/18/2021

## 2021-12-23 ENCOUNTER — Encounter: Payer: Self-pay | Admitting: Family Medicine

## 2021-12-27 ENCOUNTER — Other Ambulatory Visit: Payer: Self-pay

## 2021-12-27 DIAGNOSIS — Z8639 Personal history of other endocrine, nutritional and metabolic disease: Secondary | ICD-10-CM

## 2021-12-30 ENCOUNTER — Telehealth: Payer: Self-pay

## 2021-12-30 NOTE — Telephone Encounter (Signed)
Initiated Prior authorization [id number] (0.25 or 0.5 MG/DOSE) 2MG /3ML pen-injectors Via: Covermymeds Case/Key:[id number] Status: approved  as of 12/30/21 Reason:Start Date:11/30/2021;Coverage End Date:12/30/2022; Notified Pt via: Mychart

## 2021-12-31 ENCOUNTER — Other Ambulatory Visit (HOSPITAL_COMMUNITY): Payer: Self-pay

## 2021-12-31 DIAGNOSIS — F39 Unspecified mood [affective] disorder: Secondary | ICD-10-CM

## 2021-12-31 MED ORDER — CITALOPRAM HYDROBROMIDE 10 MG PO TABS: ORAL_TABLET | ORAL | 0 refills | Status: DC

## 2022-01-17 ENCOUNTER — Encounter: Payer: Self-pay | Admitting: Family Medicine

## 2022-01-20 ENCOUNTER — Ambulatory Visit (INDEPENDENT_AMBULATORY_CARE_PROVIDER_SITE_OTHER): Payer: 59 | Admitting: Licensed Clinical Social Worker

## 2022-01-20 DIAGNOSIS — Z87828 Personal history of other (healed) physical injury and trauma: Secondary | ICD-10-CM | POA: Diagnosis not present

## 2022-01-20 DIAGNOSIS — F39 Unspecified mood [affective] disorder: Secondary | ICD-10-CM | POA: Diagnosis not present

## 2022-01-20 DIAGNOSIS — F411 Generalized anxiety disorder: Secondary | ICD-10-CM

## 2022-01-20 DIAGNOSIS — F331 Major depressive disorder, recurrent, moderate: Secondary | ICD-10-CM

## 2022-01-20 NOTE — Progress Notes (Signed)
Virtual Visit via Video Note  I connected with Alexander Osborne on 01/20/22 at  3:00 PM EDT by a video enabled telemedicine application and verified that I am speaking with the correct person using two identifiers.  Location: Patient: home Provider: office   I discussed the limitations of evaluation and management by telemedicine and the availability of in person appointments. The patient expressed understanding and agreed to proceed.  I discussed the assessment and treatment plan with the patient. The patient was provided an opportunity to ask questions and all were answered. The patient agreed with the plan and demonstrated an understanding of the instructions.   The patient was advised to call back or seek an in-person evaluation if the symptoms worsen or if the condition fails to improve as anticipated.  I provided 54 minutes of non-face-to-face time during this encounter.   THERAPIST PROGRESS NOTE  Session Time: 3:00 PM to 3:54 PM  Participation Level: Active  Behavioral Response: CasualAlertDysphoric  Type of Therapy: Individual Therapy  Treatment Goals addressed: Managing stress help with binge eating, controlling moods, better able to communicate, self-esteem, coping  ProgressTowards Goals: Progressing-patient utilizing some good coping strategies to manage recent stressful events also utilizing therapy to help him with the process of insight and coping  Interventions: Solution Focused, Strength-based, Supportive, and Other: Coping  Summary: Alexander Osborne is a 41 y.o. male who presents with trip didn't go well. Only has his part-time boyfriend in [place] now. Mixed experience ended up being there for 4 days in [place]. Broke up with boyfriend. Booked a trip to [place].  Patient went through sequence of the events annoyed when in [place] not clear what is going on. Enough of pictures and they wanted to continue to take pictures. Felt like kept going places and taking  pictures frustrating and really couldn't enjoy himself. They said made plans and he was annoyed. Plan to go [place] beach [place]. Had fun there. Left the beach were going out. Patient describes his feelings shy frustrated when talking to acquaintances.  Therapist provided her perspective that he was aghast also feelings or not unusual and does not speak the language. Patient says he didn't like that he was forced to talk to people didn't like that. Frustrated with the process and super shy. Not socially ready to talk to people. They see he is frustrated. Alexander Osborne (ex of Patient and Alexander Osborne)-decides to join them. Not feeling it at the moment. Feeling a little weird. Feel like getting a panic attack. Separating from the group trying to breath and calm down feeling anxious and point where breathing being affected. Tells Alexander Osborne having a panic attack he comes with him and able to calm down. Then rest night able to enjoy it. Patient describes his feelings while recounting events not included in the process don't know the language patient didn't feel he was considered. Go back to the beach and plan was quick trip to the beach. Alexander Osborne a guy he met meeting up at [facility]. Patient thought something about relationship like comfortable together. Making out with him. See him do this at so many parties not jealous. Felt different with him. Felt weird left for awhile. Patient says upset wanted to go, doesn't know what is going on, weird. Alexander Osborne ask patient what was wrong patient said felt something going on and that Alexander Osborne not telling patient. Alexander Osborne upset with patient said he broke his confidence and trust selfish talking to [place]. They had just broken up and was  not for patient to tell Bernita Raisin what going on in his personal life and being selfish. Patient asked Alexander Osborne more directly what is going on and if he still wanted him as boyfriend. Patient said felt not telling him everything. In this relationship supposed to tell each other everything. Had to be  concerned about Alexander Osborne feelings but patient asks what about him? Patient felt walking on eggshells now not treating him like a boyfriend. This was patient's birthday. Patient started crying. Some of anger coming out. Alexander Osborne said did not feel safe and comfortable with him. Patient would do something to self or him.  Patient challenged Alexander Osborne and so did therapist as patient related but happen. Therapist reviewed with patient why they broke up with patient's and patient said because betrayed Alexander Osborne told Alexander Osborne didn't need to know. Patient told Alexander Osborne that just because angry not going to hurt him, birthday feeling these feelings how supposed to feel. Alexander Osborne said wants him out of the house. Patient upset because found out that Alexander Osborne going to break up anyway.  Patient asked why did he come all that way if he planned to break up really hurt by things that happened. They said would be honest with each other. Polyamorous come and go so just tell him feel wasted his time. Feeling very emotional devalues himself and acted when breaking up not feeling valued. Very hurt Alexander Osborne still blaming patient for actions patient apologized how handled things but pointed out he hasn't owned his part.  Therapist agreed advised from his supports are to let him be. Airport crying. Feeling so alone. Flowers and cake from [place] cheered up a bit. Didn't want to be in [place]. Wanted to go to [place]. Had a good time in [place]. People surprised how treated and that things were put on patient. Patient wrote him a letter a good-bye letter. Still crying everyday. Dy-hydrated muscle spasms patient still wants to be and both trying to be friends. Feels lost best friend. Mourning the future thought have with him a lot of things gave him. Stopped crying after the letter. Felt better still going through it. Alexander Osborne hasn't evaluated things his role though. Alexander Osborne supportive and understands. Feels Alexander Osborne still a great guy. Feeling that the patient now figures out was probably  because he was going to break up with him why had those feelings. Feels connection hasn't had before even from such a distance. In tune with a person and then losing him. Therapy and medication helped. Sad and crying but not depressed like with Clifton James Not bad thoughts at all. Handling it better but a little lost. Trying to be positive continue relationship, don't want to lose good people, can be best for friend and relationship can evolve to friends, his influence on patient's life don't want to lose him as a friend. Not eating emotionally.  Therapist and patient both appreciate this is good coping asked for help from Dr. Doristine Section on Ozempic with mood and cravings. Lost 20 pounds knows handling better not emotionally eating. Can't completely control why needed help.       Therapist reviewed symptoms assess important for patient that therapist facilitated expression of thoughts and feelings and provide space and support for patient to share his thoughts and feelings in session.  Assessed patient has done a lot of his own analysis with his relationship with Alexander Osborne that needed to be talked through.  Therapist agreed with patient that Alexander Osborne did not own his parts of the situation, lack of  full disclosure not considering patient's perspective and this whole evolving of the events.  Therapist emphasized some points that she thought would be helpful including patient going to [place] as a positive choice, he ended up having fun realizing that his life will continue in positive directions and that life does not revolve around 1 relationship.  At the same time supported patient in wanting to continue an important relationship for him noted in general ways of coping did not engage in emotional eating, managed his anger less depressed than last break-up all these are per therapist signs of patient engaging in treatment and working on himself for growth. Suicidal/Homicidal: No  Plan: Return again in 2 weeks.2.  Patient work on  Metallurgist of emotions, growth, self-esteem  Diagnosis: unspecified mood disorder, major depressive disorder, recurrent, moderate, history of trauma, generalized anxiety disorder  Collaboration of Care: Other none needed  Patient/Guardian was advised Release of Information must be obtained prior to any record release in order to collaborate their care with an outside provider. Patient/Guardian was advised if they have not already done so to contact the registration department to sign all necessary forms in order for [place] to release information regarding their care.   Consent: Patient/Guardian gives verbal consent for treatment and assignment of benefits for services provided during this visit. Patient/Guardian expressed understanding and agreed to proceed.   Cordella Register, LCSW 01/20/2022

## 2022-01-24 ENCOUNTER — Ambulatory Visit
Admission: RE | Admit: 2022-01-24 | Discharge: 2022-01-24 | Disposition: A | Discharge status: Home / Self Care | Payer: Managed Care, Other (non HMO) | Source: Ambulatory Visit | Attending: Internal Medicine | Admitting: Internal Medicine

## 2022-01-24 VITALS — BP 114/79 | HR 84 | Temp 98.0°F | Resp 16

## 2022-01-24 DIAGNOSIS — J020 Streptococcal pharyngitis: Secondary | ICD-10-CM | POA: Diagnosis not present

## 2022-01-24 LAB — POCT RAPID STREP A (OFFICE): Rapid Strep A Screen: POSITIVE — AB

## 2022-01-24 MED ORDER — AMOXICILLIN-POT CLAVULANATE 875-125 MG PO TABS: 1.0000 | ORAL_TABLET | Freq: Two times a day (BID) | ORAL | 0 refills | Status: DC

## 2022-01-24 NOTE — ED Triage Notes (Signed)
Reports sore throat and fatigue beginning yesterday. Taking ibuprofen to manage symptoms, last dose at 0400 this morning. States he's got a mild cough, and vomiting phlegm this morning. Denies chills, fever, diarrhea, SOB, headache, ear pain. Tonsils appear to be erythematous and touching at posterior throat, voice sounds are slightly muffled, no visible exudates. Patient states he's been frequently susceptible to strep in the past.

## 2022-01-24 NOTE — ED Provider Notes (Signed)
[facility]    CSN: [id number] Arrival date & time: 01/24/22  1011      History   Chief Complaint Chief Complaint  Patient presents with   Sore Throat    Entered by patient    HPI Alexander Osborne is a 41 y.o. male.   Patient presents with sore throat and fatigue that started yesterday.  Patient also reports 1 episode of nonbloody emesis yesterday as well.  Patient has mild stuffy nose associated with symptoms.  Denies chest pain, shortness of breath, cough, diarrhea, abdominal pain, fever.  Denies any known sick contacts.  Patient has taken ibuprofen for symptoms with minimal improvement.   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   MDD (major depressive disorder), recurrent severe, without psychosis (HCC) 11/01/2020   Bilateral carpal tunnel syndrome 10/21/2020   Routine screening for STI (sexually transmitted infection) 10/21/2020   History of type 2 diabetes mellitus 10/21/2020   Laryngopharyngeal reflux 10/18/2020   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 Authorizing Provider  amoxicillin-clavulanate (AUGMENTIN) 875-125 MG tablet Take 1 tablet by mouth every 12 (twelve) hours. 01/24/22  Yes Clancy Leiner, Rolly Salter E, FNP  citalopram (CELEXA) 10 MG tablet TAKE 1 TABLET(10 MG) BY MOUTH DAILY 12/31/21  Yes Thresa Ross, MD  DESCOVY 200-25 MG tablet Take 1 tablet by  mouth daily. 09/04/21  Yes [provider]  Fexofenadine HCl (ALLEGRA PO) Take 1 tablet by mouth daily.   Yes [provider]  lamoTRIgine (LAMICTAL) 25 MG tablet Take 3 tablets (75 mg total) by mouth daily. Take 3 a day 12/10/21  Yes Thresa Ross, MD  mometasone (NASONEX) 50 MCG/ACT nasal spray Place 2 sprays into the nose daily as needed (for allergies).   Yes [provider]  omeprazole (PRILOSEC) 40 MG capsule Take 1 capsule by mouth daily. 12/04/20  Yes [provider]  Semaglutide,0.25 or 0.5MG /DOS, (OZEMPIC, 0.25 OR 0.5 MG/DOSE,) 2 MG/3ML SOPN Inject 0.25mg  weekly x4 weeks then increase to 0.5mg  weekly 12/13/21  Yes Everrett Coombe, DO  sildenafil (VIAGRA) 50 MG tablet Take 50 mg by mouth daily as needed for erectile dysfunction.   Yes [provider]  albuterol (VENTOLIN HFA) 108 (90 Base) MCG/ACT inhaler INHALE 2 PUFFS INTO THE LUNGS EVERY 6 HOURS AS NEEDED FOR WHEEZING OR SHORTNESS OF BREATH 11/21/21   Everrett Coombe, DO  EPINEPHrine 0.3 mg/0.3 mL IJ SOAJ injection Inject 0.3 mg into the muscle as needed for anaphylaxis. 09/12/21   Garlon Hatchet, PA-C  FLUoxetine (PROZAC) 40 MG capsule Take 1 capsule (40 mg total) by mouth daily. 11/02/20 11/26/20  Antonieta Pert, MD  traZODone (DESYREL) 50 MG tablet Take 50-100 mg by mouth at bedtime as needed. 11/21/20 01/23/21  [provider]    Family History Family History  Problem Relation Age of Onset   Cancer Mother 2       pancreatic cancer   Hypertension Father    Heart disease Father 52       CAD   Bipolar disorder Brother    Diabetes Sister    Allergic rhinitis Neg Hx    Angioedema Neg Hx    Asthma Neg Hx    Eczema Neg Hx    Immunodeficiency Neg Hx    Urticaria Neg Hx     Social History Social History   Tobacco Use   Smoking status: Former    Packs/day: 0.25    Years: 11.00    Total pack years: 2.75    Types: Cigarettes    Quit date: 10/03/2017    Years since quitting: 4.3    Smokeless tobacco: Never  Vaping Use   Vaping Use: Never used  Substance Use Topics   Alcohol use: Yes    Alcohol/week: 13.0 standard drinks of alcohol    Types: 12 Cans of beer, 1 Standard drinks or equivalent per week   Drug use: No     Allergies   Fish-derived products and Shellfish allergy   Review of Systems Review of Systems Per HPI  Physical Exam Triage Vital Signs ED Triage Vitals  Enc Vitals Group     BP 01/24/22 1024 114/79     Pulse Rate 01/24/22 1024 84     Resp 01/24/22 1024 16     Temp 01/24/22 1024 98 F (36.7 C)     Temp Source 01/24/22 1024 Oral     SpO2 01/24/22 1024 95 %     Weight --      Height --      Head Circumference --      Peak Flow --      Pain Score 01/24/22 1026 6     Pain Loc --      Pain Edu? --      Excl. in [place]? --    No data found.  Updated Vital Signs BP 114/79 (BP Location: Left Arm)   Pulse 84   Temp 98 F (36.7 C) (Oral)   Resp 16   SpO2 95%   Visual Acuity Right Eye Distance:   Left Eye Distance:   Bilateral Distance:    Right Eye Near:   Left Eye Near:    Bilateral Near:     Physical Exam Constitutional:      General: He is not in acute distress.    Appearance: Normal appearance. He is not toxic-appearing or diaphoretic.  HENT:     Head: Normocephalic and atraumatic.     Right Ear: Tympanic membrane and ear canal normal.     Left Ear: Tympanic membrane and ear canal normal.     Nose: Congestion present.     Mouth/Throat:     Mouth: Mucous membranes are moist.     Pharynx: Posterior oropharyngeal erythema present. No pharyngeal swelling or oropharyngeal exudate.     Tonsils: No tonsillar exudate or tonsillar abscesses. 1+ on the right. 1+ on the left.  Eyes:     Extraocular Movements: Extraocular movements intact.     Conjunctiva/sclera: Conjunctivae normal.     Pupils: Pupils are equal, round, and reactive to light.  Cardiovascular:     Rate and Rhythm: Normal rate and regular rhythm.     Pulses:  Normal pulses.     Heart sounds: Normal heart sounds.  Pulmonary:     Effort: Pulmonary effort is  normal. No respiratory distress.     Breath sounds: Normal breath sounds. No stridor. No wheezing, rhonchi or rales.  Abdominal:     General: Abdomen is flat. Bowel sounds are normal.     Palpations: Abdomen is soft.  Musculoskeletal:        General: Normal range of motion.     Cervical back: Normal range of motion.  Skin:    General: Skin is warm and dry.  Neurological:     General: No focal deficit present.     Mental Status: He is alert and oriented to person, place, and time. Mental status is at baseline.  Psychiatric:        Mood and Affect: Mood normal.        Behavior: Behavior normal.      UC Treatments / Results  Labs (all labs ordered are listed, but only abnormal results are displayed) Labs Reviewed  POCT RAPID STREP A (OFFICE) - Abnormal; Notable for the following components:      Result Value   Rapid Strep A Screen Positive (*)    All other components within normal limits    EKG   Radiology No results found.  Procedures Procedures (including critical care time)  Medications Ordered in UC Medications - No data to display  Initial Impression / Assessment and Plan / UC Course  I have reviewed the triage vital signs and the nursing notes.  Pertinent labs & imaging results that were available during my care of the patient were reviewed by me and considered in my medical decision making (see chart for details).     Rapid strep was positive.  Will treat with Augmentin antibiotic.  No signs of peritonsillar abscess on exam.  Discussed supportive care and symptom management with patient.  Discussed return precautions.  Patient verbalized understanding and was agreeable with plan. Final Clinical Impressions(s) / UC Diagnoses   Final diagnoses:  Strep pharyngitis     Discharge Instructions      You have strep throat which is being treated with antibiotic.   Please follow-up if symptoms persist or worsen.    ED Prescriptions     Medication Sig Dispense Auth. Provider   amoxicillin-clavulanate (AUGMENTIN) 875-125 MG tablet Take 1 tablet by mouth every 12 (twelve) hours. 14 tablet [place], Acie Fredrickson, [place]      PDMP not reviewed this encounter.   Gustavus Bryant, [place] 01/24/22 1053

## 2022-01-24 NOTE — Discharge Instructions (Signed)
You have strep throat which is being treated with antibiotic.  Please follow-up if symptoms persist or worsen. 

## 2022-01-31 ENCOUNTER — Ambulatory Visit (INDEPENDENT_AMBULATORY_CARE_PROVIDER_SITE_OTHER): Payer: 59 | Admitting: Licensed Clinical Social Worker

## 2022-01-31 ENCOUNTER — Telehealth (HOSPITAL_COMMUNITY): Payer: Self-pay | Admitting: Licensed Clinical Social Worker

## 2022-01-31 DIAGNOSIS — F39 Unspecified mood [affective] disorder: Secondary | ICD-10-CM

## 2022-01-31 DIAGNOSIS — F411 Generalized anxiety disorder: Secondary | ICD-10-CM

## 2022-01-31 DIAGNOSIS — Z87828 Personal history of other (healed) physical injury and trauma: Secondary | ICD-10-CM

## 2022-01-31 DIAGNOSIS — F331 Major depressive disorder, recurrent, moderate: Secondary | ICD-10-CM | POA: Diagnosis not present

## 2022-01-31 NOTE — Telephone Encounter (Signed)
Called patient regarding appointment conflict 06/19/3141. No answer and voicemail is full.
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
## 2022-02-14 ENCOUNTER — Ambulatory Visit (HOSPITAL_COMMUNITY): Payer: 59 | Admitting: Licensed Clinical Social Worker

## 2022-02-18 ENCOUNTER — Ambulatory Visit (HOSPITAL_COMMUNITY): Payer: 59 | Admitting: Licensed Clinical Social Worker

## 2022-02-18 ENCOUNTER — Encounter (HOSPITAL_COMMUNITY): Payer: Self-pay

## 2022-02-18 NOTE — Progress Notes (Signed)
Therapist contacted patient through My Chart and he did not respond. Session is a no show 

## 2022-02-19 ENCOUNTER — Other Ambulatory Visit (HOSPITAL_COMMUNITY): Payer: Self-pay | Admitting: Psychiatry

## 2022-02-24 ENCOUNTER — Encounter (HOSPITAL_COMMUNITY): Payer: Self-pay

## 2022-02-24 ENCOUNTER — Ambulatory Visit (HOSPITAL_COMMUNITY): Payer: 59 | Admitting: Licensed Clinical Social Worker

## 2022-02-24 DIAGNOSIS — F39 Unspecified mood [affective] disorder: Secondary | ICD-10-CM

## 2022-02-24 DIAGNOSIS — Z87828 Personal history of other (healed) physical injury and trauma: Secondary | ICD-10-CM

## 2022-02-24 DIAGNOSIS — F331 Major depressive disorder, recurrent, moderate: Secondary | ICD-10-CM

## 2022-02-24 NOTE — Progress Notes (Signed)
Therapist contacted patient by My Chart and he did not show 

## 2022-03-03 ENCOUNTER — Ambulatory Visit (INDEPENDENT_AMBULATORY_CARE_PROVIDER_SITE_OTHER): Payer: 59 | Admitting: Licensed Clinical Social Worker

## 2022-03-03 DIAGNOSIS — Z87828 Personal history of other (healed) physical injury and trauma: Secondary | ICD-10-CM

## 2022-03-03 DIAGNOSIS — F411 Generalized anxiety disorder: Secondary | ICD-10-CM

## 2022-03-03 DIAGNOSIS — F39 Unspecified mood [affective] disorder: Secondary | ICD-10-CM

## 2022-03-03 DIAGNOSIS — F331 Major depressive disorder, recurrent, moderate: Secondary | ICD-10-CM
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
## 2022-03-17 ENCOUNTER — Ambulatory Visit (INDEPENDENT_AMBULATORY_CARE_PROVIDER_SITE_OTHER): Payer: 59 | Admitting: Licensed Clinical Social Worker

## 2022-03-17 ENCOUNTER — Encounter: Payer: Self-pay | Admitting: Family Medicine

## 2022-03-17 ENCOUNTER — Ambulatory Visit (INDEPENDENT_AMBULATORY_CARE_PROVIDER_SITE_OTHER): Payer: Managed Care, Other (non HMO) | Admitting: Family Medicine

## 2022-03-17 VITALS — BP 132/86 | HR 77 | Ht 70.0 in | Wt 229.0 lb

## 2022-03-17 DIAGNOSIS — Z8639 Personal history of other endocrine, nutritional and metabolic disease: Secondary | ICD-10-CM | POA: Diagnosis not present

## 2022-03-17 DIAGNOSIS — Z79899 Other long term (current) drug therapy: Secondary | ICD-10-CM | POA: Insufficient documentation

## 2022-03-17 DIAGNOSIS — Z23 Encounter for immunization: Secondary | ICD-10-CM

## 2022-03-17 DIAGNOSIS — F411 Generalized anxiety disorder: Secondary | ICD-10-CM

## 2022-03-17 DIAGNOSIS — F331 Major depressive disorder, recurrent, moderate: Secondary | ICD-10-CM | POA: Diagnosis not present

## 2022-03-17 DIAGNOSIS — F332 Major depressive disorder, recurrent severe without psychotic features: Secondary | ICD-10-CM | POA: Diagnosis not present

## 2022-03-17 DIAGNOSIS — F39 Unspecified mood [affective] disorder: Secondary | ICD-10-CM | POA: Diagnosis not present

## 2022-03-17 DIAGNOSIS — J4540 Moderate persistent asthma, uncomplicated: Secondary | ICD-10-CM

## 2022-03-17 DIAGNOSIS — Z87828 Personal history of other (healed) physical injury and trauma: Secondary | ICD-10-CM

## 2022-03-17 LAB — POCT GLYCOSYLATED HEMOGLOBIN (HGB A1C): HbA1c, POC (prediabetic range): 5.9 % (ref 5.7–6.4)

## 2022-03-17 MED ORDER — DESCOVY 200-25 MG PO TABS: 1.0000 | ORAL_TABLET | Freq: Every day | ORAL | 0 refills | Status: DC

## 2022-03-17 MED ORDER — EPINEPHRINE 0.3 MG/0.3ML IJ SOAJ: 0.3000 mg | INTRAMUSCULAR | 1 refills | Status: AC | PRN

## 2022-03-17 MED ORDER — ALBUTEROL SULFATE HFA 108 (90 BASE) MCG/ACT IN AERS: 2.0000 | INHALATION_SPRAY | Freq: Four times a day (QID) | RESPIRATORY_TRACT | 0 refills | Status: DC | PRN

## 2022-03-17 NOTE — Addendum Note (Signed)
Addended by: Ardyth Man on: 03/17/2022 02:26 PM   Modules accepted: Orders

## 2022-03-17 NOTE — Progress Notes (Signed)
Alexander Osborne [place] - 41 y.o. male MRN [id number]  Date of birth: 09/20/1980  Subjective No chief complaint on file.   HPI Alexander Osborne is a 41 y.o. male here today for follow up visit.   Reports that he is doing pretty well.   Started on Ozempic previously.  Titrated to 0.5mg  weekly.  He has had some nausea and feeling of dizziness at times.  His weight is down about 15lbs.  He does not monitor blood sugars at home.  He does continue to exercise regularly.   He is seeing psychiatry for management of depression.  Current treatment inlcudes celexa and lamictal.  Stable with current medications.  No side effects noted.   Remains on Descovy for PrEP.    Seen at urgent care for penile discharge.  Treated empirically for GC/Chlamydia.  Resolved and had positive test for GC.  HIV antibody negative.    ROS:  A comprehensive ROS was completed and negative except as noted per HPI  Allergies  Allergen Reactions   Fish-Derived Products Other (See Comments)    Allergic, but reaction not recalled Confirmed thru testing   Shellfish Allergy Other (See Comments)    Patient does not recall the reaction Confirmed thru testing    Past Medical History:  Diagnosis Date   Allergy    Anxiety    Asthma    Depression    Diabetes mellitus without complication (HCC)     Past Surgical History:  Procedure Laterality Date   corneal transplant left     EYE SURGERY      Social History   Socioeconomic History   Marital status: Legally Separated    Spouse name: Not on file   Number of children: 0   Years of education: Not on file   Highest education level: Not on file  Occupational History   Occupation: Production designer, theatre/television/film    Employer: [employer]  Tobacco Use   Smoking status: Former    Packs/day: 0.25    Years: 11.00    Total pack years: 2.75    Types: Cigarettes    Quit date: 10/03/2017    Years since quitting: 4.4   Smokeless tobacco: Never  Vaping Use   Vaping Use: Never used   Substance and Sexual Activity   Alcohol use: Yes    Alcohol/week: 13.0 standard drinks of alcohol    Types: 12 Cans of beer, 1 Standard drinks or equivalent per week   Drug use: No   Sexual activity: Yes    Partners: Male    Birth control/protection: None    Comment: SS partner  Other Topics Concern   Not on file  Social History Narrative   Marital status: married x 4 years; happily married.       Lives; with husbnad      Employment: Building control surveyor at [employer]      Tobacco: socially; weekends      Alcohol: 1-3 drinks per week      Drugs: none      Exercise: none   Denies caffeine use    Social Determinants of Corporate investment banker Strain: Not on file  Food Insecurity: Not on file  Transportation Needs: Not on file  Physical Activity: Not on file  Stress: Not on file  Social Connections: Not on file    Family History  Problem Relation Age of Onset   Cancer Mother 35       pancreatic cancer   Hypertension Father  Heart disease Father 78       CAD   Bipolar disorder Brother    Diabetes Sister    Allergic rhinitis Neg Hx    Angioedema Neg Hx    Asthma Neg Hx    Eczema Neg Hx    Immunodeficiency Neg Hx    Urticaria Neg Hx     Health Maintenance  Topic Date Due   OPHTHALMOLOGY EXAM  04/21/2022 (Originally 08/06/2019)   Hepatitis C Screening  12/14/2022 (Originally 12/30/2000)   COVID-19 Vaccine (5 - 2023-24 season) 05/12/2022   Diabetic kidney evaluation - GFR measurement  05/23/2022   HEMOGLOBIN A1C  06/15/2022   Diabetic kidney evaluation - Urine ACR  12/14/2022   FOOT EXAM  12/14/2022   INFLUENZA VACCINE  Completed   HIV Screening  Completed   HPV VACCINES  Aged Out     ----------------------------------------------------------------------------------------------------------------------------------------------------------------------------------------------------------------- Physical Exam BP 132/86 (BP Location: Right Arm, Patient  Position: Sitting, Cuff Size: Large)   Pulse 77   Ht 5\' 10"  (1.778 m)   Wt 229 lb (103.9 kg)   SpO2 94%   BMI 32.86 kg/m   Physical Exam Constitutional:      Appearance: Normal appearance.  HENT:     Head: Normocephalic and atraumatic.  Neurological:     General: No focal deficit present.     Mental Status: He is alert and oriented to person, place, and time.     ------------------------------------------------------------------------------------------------------------------------------------------------------------------------------------------------------------------- Assessment and Plan  Moderate persistent asthma Stable with current inhalers.  Recommend continuation.   MDD (major depressive disorder), recurrent severe, without psychosis ([place]) Seen and managed by psychiatry.  Stable at this time.   History of type 2 diabetes mellitus He has had some nausea with ozempic.  Reviewed dietary changes that may help with this.  Continue at current strength.    On pre-exposure prophylaxis for HIV Has taken descovy for PrEP.  Would like PCP to take this over.  Negative HIV at the beginning of the month.  Will continue, updated rx sent in.  F/u in 3 months.    Meds ordered this encounter  Medications   EPINEPHrine 0.3 mg/0.3 mL IJ SOAJ injection    Sig: Inject 0.3 mg into the muscle as needed for anaphylaxis.    Dispense:  1 each    Refill:  1   albuterol (VENTOLIN HFA) 108 (90 Base) MCG/ACT inhaler    Sig: Inhale 2 puffs into the lungs every 6 (six) hours as needed for wheezing or shortness of breath.    Dispense:  6.7 g    Refill:  0   DESCOVY 200-25 MG tablet    Sig: Take 1 tablet by mouth daily.    Dispense:  90 tablet    Refill:  0    Return in about 3 months (around 06/17/2022) for F/u T2DM/PrEP.    This visit occurred during the SARS-CoV-2 public health emergency.  Safety protocols were in place, including screening questions prior to the visit, additional usage  of staff PPE, and extensive cleaning of exam room while observing appropriate contact time as indicated for disinfecting solutions.

## 2022-03-17 NOTE — Assessment & Plan Note (Signed)
Has taken descovy for PrEP.  Would like PCP to take this over.  Negative HIV at the beginning of the month.  Will continue, updated rx sent in.  F/u in 3 months.

## 2022-03-17 NOTE — Assessment & Plan Note (Signed)
He has had some nausea with ozempic.  Reviewed dietary changes that may help with this.  Continue at current strength.

## 2022-03-17 NOTE — Patient Instructions (Signed)
Continue Ozempic.  We can increase to 1mg  if nausea improves.  See me again in 3 months.  We'll get fasting labs at this visit.

## 2022-03-17 NOTE — Progress Notes (Signed)
Virtual Visit via Video Note  I connected with Alexander Osborne on 03/17/22 at  3:00 PM EST by a video enabled telemedicine application and verified that I am speaking with the correct person using two identifiers.  Location: Patient: home Provider: office   I discussed the limitations of evaluation and management by telemedicine and the availability of in person appointments. The patient expressed understanding and agreed to proceed.  I discussed the assessment and treatment plan with the patient. The patient was provided an opportunity to ask questions and all were answered. The patient agreed with the plan and demonstrated an understanding of the instructions.   The patient was advised to call back or seek an in-person evaluation if the symptoms worsen or if the condition fails to improve as anticipated.  I provided 50 minutes of non-face-to-face time during this encounter.  THERAPIST PROGRESS NOTE  Session Time: 3:00 PM to 3:50 PM  Participation Level: Active  Behavioral Response: CasualAlertEuthymic  Type of Therapy: Individual Therapy  Treatment Goals addressed: Has to figure out plans for the future, still work on mood management, communication and how moods impact this, self-esteem, coping  ProgressTowards Goals: Progressing-patient actively working on motivational strategies that we will help him with developing healthy habits, coping  Interventions: Motivational Interviewing, Strength-based, Supportive, and Other: coping  Summary: Alexander Osborne is a 41 y.o. 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 Went to doctor and talked to him about A1C dropped and feel great about that. Started vape after break-up and feels goes from one vice to another.  Now working on stop vaping but now going to his sugar feels his body is craving it.  Perhaps it relates to Ozempic regulating sugar but finds he is dizzy a lot. Doctor wants him to snake on  protein during the day to bring down sugar. Makes you nauseous and dizzy when feeling that way feels have to consume sugar. Hard with treats at the store. Doesn't want anything healthy.  Shannan his boyfriend, willing to help him with getting off of vape. Helped him eat better meal prep to eat better. At least have someone to support him. Get along. He is a nice version of patient. Perhaps patient is more advanced in liking himself he has more insecurities. Feels really nice not sure if because he likes patient.  Patient knows making progress working on himself for things like moodiness.  He didn't have any arguments with Rhett. Patient says he can be a little bit of "asshole" bit more playing and [place] gets it. With [name] maybe culture that causing miscommunication [place] gets it more. Amazing trip treated like a king. His boyfriend wants to improve himself, do better, can do together. Feels he is a little intense very attached and needy maybe though right now patient needs it. He is all about patient. Told him that he has to work on self-esteem as well has a lot going for him. They said to each other let's be boyfriends try it out. Not announce it for awhile. Try to cool down dating different people like the attention getting from him. Focus on the people has [employer] part-time. Both feel like it was fast. Both of them are people who know what they want. From this don't want to be controlled live the life the way he wants. If he is able to support patient doing his thing be more receptive to being about him. They don't want to fall into traps  that didn't follow before. Lean on him to help him.  Worked on motivation in session and patient realizes got to want it. It is hard though. Does [employer]. Eats away at his income and one of the things wants to improve is fiances.  Reviewed session patient sad find out what motivates him, what kind of support needs to do that. Noted accomplish of not getting $96 membership for  D[employer] therapist provided praise for this was an accomplishment.  Explored working on this and patient says needs to do something easy to cook.  He needs to figure out motivation, small tasks and just do it even if not motivated. Small snacks to regulate for nauseous and dizziness. Explored snacks. Has motivation but also addicted personality has to be addicted to one thing.  Patient said a lot to think about but also said take it one step and task at a time.   Explored with patient issues feels he is which is 1 addiction to another therapist noted in general finding coping strategies that are bad for [place] but actually make things better.  Noted motivational interviewing and then it comes down to finding more reasons to stop addiction and keep going things in his case like being healthy, feeling good about himself that are more significant than satisfying a craving.  Looked at other strategies to include breaking his into small tasks, building motivation by just doing something.  Explored obstacles for patient include does not like to cook so look at ways he can overcome obstacles including using the crockpot therapist using her own motivational strategies to tell him its easy and would be happy when he is eating healthier, having protein snacks around the house that could be supportive for his cravings as well as diabetes, using his support to help him with things such as meal planning.  Also rewarding himself when he accomplishes something noted that he did not go ahead with door-past and therapist said this was the reason to feel he accomplish something.  Noted is well the positives of finding relationship he enjoys and continuing with it therapist provided space and support for patient to talk about thoughts and feelings in session. Suicidal/Homicidal: No  Plan: Return again in 1 week.2.  Work on motivational strategies with patient help patient to process thoughts and feelings in session to help with  coping  Diagnosis: unspecified mood disorder, major depressive disorder, recurrent, moderate, history of trauma, generalized anxiety disorder  Collaboration of Care: Other none needed  Patient/Guardian was advised Release of Information must be obtained prior to any record release in order to collaborate their care with an outside provider. Patient/Guardian was advised if they have not already done so to contact the registration department to sign all necessary forms in order for [place] to release information regarding their care.   Consent: Patient/Guardian gives verbal consent for treatment and assignment of benefits for services provided during this visit. Patient/Guardian expressed understanding and agreed to proceed.   Cordella Register, LCSW 03/17/2022

## 2022-03-17 NOTE — Assessment & Plan Note (Signed)
Seen and managed by psychiatry.  Stable at this time.

## 2022-03-17 NOTE — Assessment & Plan Note (Signed)
Stable with current inhalers.  Recommend continuation. °

## 2022-03-18 ENCOUNTER — Other Ambulatory Visit (HOSPITAL_COMMUNITY): Payer: Self-pay

## 2022-03-18 MED ORDER — LAMOTRIGINE 25 MG PO TABS: ORAL_TABLET | ORAL | 1 refills | Status: DC

## 2022-03-28 ENCOUNTER — Ambulatory Visit (INDEPENDENT_AMBULATORY_CARE_PROVIDER_SITE_OTHER): Payer: 59 | Admitting: Licensed Clinical Social Worker

## 2022-03-28 DIAGNOSIS — F411 Generalized anxiety disorder: Secondary | ICD-10-CM

## 2022-03-28 DIAGNOSIS — F39 Unspecified mood [affective] disorder: Secondary | ICD-10-CM | POA: Diagnosis not present

## 2022-03-28 DIAGNOSIS — F331 Major depressive disorder, recurrent, moderate: Secondary | ICD-10-CM | POA: Diagnosis not present

## 2022-03-28 DIAGNOSIS — Z87828 Personal history of other (healed) physical injury and trauma: Secondary | ICD-10-CM

## 2022-03-28 NOTE — Progress Notes (Signed)
Virtual Visit via Video Note  I connected with Alexander Osborne on 03/28/22 at  9:00 AM EST by a video enabled telemedicine application and verified that I am speaking with the correct person using two identifiers.  Location: Patient: home Provider: home office   I discussed the limitations of evaluation and management by telemedicine and the availability of in person appointments. The patient expressed understanding and agreed to proceed.  I discussed the assessment and treatment plan with the patient. The patient was provided an opportunity to ask questions and all were answered. The patient agreed with the plan and demonstrated an understanding of the instructions.   The patient was advised to call back or seek an in-person evaluation if the symptoms worsen or if the condition fails to improve as anticipated.  I provided 50 minutes of non-face-to-face time during this encounter.  THERAPIST PROGRESS NOTE  Session Time: 9:00 AM to 9:50 AM  Participation Level: Active  Behavioral Response: CasualAlertappropriate  Type of Therapy: Individual Therapy  Treatment Goals addressed:  Has to figure out plans for the future, still work on mood management, communication and how moods impact this, self-esteem, coping  ProgressTowards Goals: Progressing-working with patient on better eating habits working on both motivation and implementation  Interventions: Solution Focused, Strength-based, and Supportive  Summary: Alexander Osborne is a 41 y.o. male who presents with plan to improve eating habits is going terribly. Can't find any motivation. Really stubborn right now. Needs to do it. Last night had pancakes, take out from [employer] wake up feeling the effects high sugar not good with diabetes. Lazy right now.  Therapist noted winter months people can be lazier patient said not him he actually goes out more in winter. Talked about motivation strategies such as breaking things down into  smaller tasks, patient saying out loud that he just needs to do it.   it. Not vaping back to food.  Therapist noted people with trauma can have food issue and patient says thinks get mind adjusted before tackle this.  As noted as we work on this some adjustments to eating habits would be healthiest approach.  Patient says doesn't like prepping food. Need to go grocery store. Has been able to that before. Keep saying going to do it then don't. [name] and [name] both trying to support him. Patient says he is stubborn. Goes from one thing bad to another. If not binge eating, vaping addictive personality.  Therapist talked about distress tolerance Hello we are working on coping skills that make things better.  Patient says understands this knows it doesn't fit in with his goals but feeling in moment needs satisfaction of the food even in long run getting away form goals. Knows this but still eats. Patient says he thinks he is stuck in implementation stage. Recognizes what doing stuck in stage of implementation. Noted this weekend problem with a friend and sad so used food to help feel better. Doesn't have any food in house has to go grocery store have better choices. Therapist noted in changing any bad habit have to adjust our environment to help with goals. Patient realizes he needs better snacks.  Patient says medication lowers his sugar and has nausea needs to eat more regularly for this reason.  Needs to talk to doctor as he is dizzy as well.  Get some recipes and snacks with patient said he is more motivated now that he sees the actual recipes therapist gave him homework assignment to go to grocery  store and cook today.    Therapist work with patient on unhealthy food says and eating.  Therapist looked at trauma book and noted people with trauma often have A eating disorder.  It is a challenge to change eating habits needs to be approached gradually with a sense of compassion.  People with trauma can turn to food  discomfort discussed plan for patient to try dietary adjustments when he cans on a moderate doable level.  Utilize motivational interviewing pros and cons of changing dietary habits as well as distress tolerance to note we are working on healthier coping skills, skills that will improve situation not make it worse.  Patient says he realizes this but struggles with implementation we looked at some recipes for diabetes and snacks making it such an overwhelming task.  Out motivation in general action helps motivate, breaking tasks down using positive self talk.  Therapist gave patient assignment to go to the grocery store make a list so he can cook a recipe as well as get some snacks.  Therapist provided space and support for patient to talk about thoughts and feelings in session Suicidal/Homicidal: No  Plan: Return again in 2 weeks.  Diagnosis: unspecified mood disorder, major depressive disorder, recurrent, moderate, history of trauma, generalized anxiety disorder   Collaboration of Care: Other none needed  Patient/Guardian was advised Release of Information must be obtained prior to any record release in order to collaborate their care with an outside provider. Patient/Guardian was advised if they have not already done so to contact the registration department to sign all necessary forms in order for [place] to release information regarding their care.   Consent: Patient/Guardian gives verbal consent for treatment and assignment of benefits for services provided during this visit. Patient/Guardian expressed understanding and agreed to proceed.   Coolidge Breeze, LCSW 03/28/2022

## 2022-03-31 ENCOUNTER — Encounter (HOSPITAL_COMMUNITY): Payer: Self-pay | Admitting: Psychiatry

## 2022-03-31 ENCOUNTER — Telehealth (INDEPENDENT_AMBULATORY_CARE_PROVIDER_SITE_OTHER): Payer: 59 | Admitting: Psychiatry

## 2022-03-31 DIAGNOSIS — F39 Unspecified mood [affective] disorder: Secondary | ICD-10-CM

## 2022-03-31 DIAGNOSIS — F411 Generalized anxiety disorder: Secondary | ICD-10-CM | POA: Diagnosis not present

## 2022-03-31 DIAGNOSIS — F331 Major depressive disorder, recurrent, moderate: Secondary | ICD-10-CM

## 2022-03-31 MED ORDER — LAMOTRIGINE 25 MG PO TABS: ORAL_TABLET | ORAL | 2 refills | Status: DC

## 2022-03-31 MED ORDER — CITALOPRAM HYDROBROMIDE 10 MG PO TABS: ORAL_TABLET | ORAL | 0 refills | Status: DC

## 2022-03-31 NOTE — Progress Notes (Signed)
[facility] Follow Up Visit  Patient Identification: Alexander Osborne MRN:  [id number] Date of Evaluation:  03/31/2022 Referral Source: hospital discharge Chief Complaint:  follow up  depression, mood symptoms Visit Diagnosis:    ICD-10-CM   1. Unspecified mood (affective) disorder (HCC)  F39 citalopram (CELEXA) 10 MG tablet    2. MDD (major depressive disorder), recurrent episode, moderate (HCC)  F33.1     3. GAD (generalized anxiety disorder)  F41.1       Virtual Visit via Video Note  I connected with Alexander Osborne on 03/31/22 at  1:00 PM EST by a video enabled telemedicine application and verified that I am speaking with the correct person using two identifiers.  Location: Patient: home Provider: home office   I discussed the limitations of evaluation and management by telemedicine and the availability of in person appointments. The patient expressed understanding and agreed to proceed.    I discussed the assessment and treatment plan with the patient. The patient was provided an opportunity to ask questions and all were answered. The patient agreed with the plan and demonstrated an understanding of the instructions.   The patient was advised to call back or seek an in-person evaluation if the symptoms worsen or if the condition fails to improve as anticipated.  I provided 15 minutes of non-face-to-face time during this encounter.     History of Present Illness: Patient is a 41  years old currently single male referred by hospital discharge with OD admission for one day  He works full-time at [employer] fair, handling stress and relationships or breakup Therapy has helped no rash on lamictal   Working on self growth    Aggravating factors; relationhship and job pressure can be  Modifying factors : relationship   Severity improved    Past Psychiatric History: depression  Previous Psychotropic Medications: Yes   Substance Abuse  History in the last 12 months:  Yes.    Consequences of Substance Abuse: Alcohol use not regular, but discussed its effect on depression  Past Medical History:  Past Medical History:  Diagnosis Date   Allergy    Anxiety    Asthma    Depression    Diabetes mellitus without complication (HCC)     Past Surgical History:  Procedure Laterality Date   corneal transplant left     EYE SURGERY      Family Psychiatric History: brother : bipolar, sister, depression  Family History:  Family History  Problem Relation Age of Onset   Cancer Mother 11       pancreatic cancer   Hypertension Father    Heart disease Father 38       CAD   Bipolar disorder Brother    Diabetes Sister    Allergic rhinitis Neg Hx    Angioedema Neg Hx    Asthma Neg Hx    Eczema Neg Hx    Immunodeficiency Neg Hx    Urticaria Neg Hx     Social History:   Social History   Socioeconomic History   Marital status: Legally Separated    Spouse name: Not on file   Number of children: 0   Years of education: Not on file   Highest education level: Not on file  Occupational History   Occupation: Event organiser: Radio producer  Tobacco Use   Smoking status: Former    Packs/day: 0.25    Years: 11.00    Total pack years: 2.75  Types: Cigarettes    Quit date: 10/03/2017    Years since quitting: 4.4   Smokeless tobacco: Never  Vaping Use   Vaping Use: Never used  Substance and Sexual Activity   Alcohol use: Yes    Alcohol/week: 13.0 standard drinks of alcohol    Types: 12 Cans of beer, 1 Standard drinks or equivalent per week   Drug use: No   Sexual activity: Yes    Partners: Male    Birth control/protection: None    Comment: SS partner  Other Topics Concern   Not on file  Social History Narrative   Marital status: married x 4 years; happily married.       Lives; with husbnad      Employment: Building control surveyor at [employer]      Tobacco: socially; weekends      Alcohol: 1-3 drinks per  week      Drugs: none      Exercise: none   Denies caffeine use    Social Determinants of Corporate investment banker Strain: Not on file  Food Insecurity: Not on file  Transportation Needs: Not on file  Physical Activity: Not on file  Stress: Not on file  Social Connections: Not on file     Allergies:   Allergies  Allergen Reactions   Fish-Derived Products Other (See Comments)    Allergic, but reaction not recalled Confirmed thru testing   Shellfish Allergy Other (See Comments)    Patient does not recall the reaction Confirmed thru testing    Metabolic Disorder Labs: Lab Results  Component Value Date   HGBA1C 5.9 03/17/2022   MPG 143 05/23/2021   MPG 120 03/04/2021   No results found for: "PROLACTIN" Lab Results  Component Value Date   CHOL 198 05/23/2021   TRIG 131 05/23/2021   HDL 48 05/23/2021   CHOLHDL 4.1 05/23/2021   VLDL 25 11/02/2020   LDLCALC 125 (H) 05/23/2021   LDLCALC 114 (H) 03/04/2021   Lab Results  Component Value Date   TSH 1.06 05/23/2021    Therapeutic Level Labs: No results found for: "LITHIUM" No results found for: "CBMZ" No results found for: "VALPROATE"  Current Medications: Current Outpatient Medications  Medication Sig Dispense Refill   albuterol (VENTOLIN HFA) 108 (90 Base) MCG/ACT inhaler Inhale 2 puffs into the lungs every 6 (six) hours as needed for wheezing or shortness of breath. 6.7 g 0   amoxicillin-clavulanate (AUGMENTIN) 875-125 MG tablet Take 1 tablet by mouth every 12 (twelve) hours. 14 tablet 0   citalopram (CELEXA) 10 MG tablet TAKE 1 TABLET(10 MG) BY MOUTH DAILY 90 tablet 0   DESCOVY 200-25 MG tablet Take 1 tablet by mouth daily. 90 tablet 0   EPINEPHrine 0.3 mg/0.3 mL IJ SOAJ injection Inject 0.3 mg into the muscle as needed for anaphylaxis. 1 each 1   Fexofenadine HCl (ALLEGRA PO) Take 1 tablet by mouth daily.     lamoTRIgine (LAMICTAL) 25 MG tablet TAKE 3 TABLETS (75 MG TOTAL) BY MOUTH DAILY. 90 tablet 2    mometasone (NASONEX) 50 MCG/ACT nasal spray Place 2 sprays into the nose daily as needed (for allergies).     omeprazole (PRILOSEC) 40 MG capsule Take 1 capsule by mouth daily.     Semaglutide,0.25 or 0.5MG /DOS, (OZEMPIC, 0.25 OR 0.5 MG/DOSE,) 2 MG/3ML SOPN Inject 0.25mg  weekly x4 weeks then increase to 0.5mg  weekly 3 mL 3   sildenafil (VIAGRA) 50 MG tablet Take 50 mg by mouth daily as needed  for erectile dysfunction.     No current facility-administered medications for this visit.     Psychiatric Specialty Exam: Review of Systems  Cardiovascular:  Negative for chest pain.  Skin:  Negative for rash.  Psychiatric/Behavioral:  Negative for agitation, decreased concentration, hallucinations and self-injury.     There were no vitals taken for this visit.There is no height or weight on file to calculate BMI.  General Appearance: Casual  Eye Contact:  Good  Speech:  Clear and Coherent  Volume:  Normal  Mood: improving  Affect:  Congruent  Thought Process:  Goal Directed  Orientation:  Full (Time, Place, and Person)  Thought Content:  Rumination  Suicidal Thoughts:  No  Homicidal Thoughts:  No  Memory:  Immediate;   Fair  Judgement:  Fair  Insight:  Fair  Psychomotor Activity:  Normal  Concentration:  Concentration: Fair  Recall:  Good  Fund of Knowledge:Good  Language: Good  Akathisia:  No  Handed:   AIMS (if indicated):  not done  Assets:  Desire for Improvement Physical Health  ADL's:  Intact  Cognition: WNL  Sleep:  Fair   Screenings: AIMS    Flowsheet Row [facility]  AIMS Total Score 0      AUDIT    Flowsheet Row [facility]  Alcohol Use Disorder Identification Test Final Score (AUDIT) 3      GAD-7    Flowsheet Row Office Visit from 12/13/2021 in [facility] Office Visit from  03/04/2021 in [facility] Office Visit from 10/18/2020 in [facility]  Total GAD-7 Score 4 6 7       PHQ2-9    Flowsheet Row Office Visit from 03/17/2022 in [facility] Office Visit from 12/13/2021 in [facility] Office Visit from 09/10/2021 in [facility] Office Visit from 03/04/2021 in [facility] from 01/11/2021 [facility] [place]  PHQ-2 Total Score 0 2 0 4 6  PHQ-9 Total Score 0 8 -- 9 12      Flowsheet Row ED from 01/24/2022 in [facility]  Video Visit from 12/10/2021 in [facility] [place] ED from 10/22/2021 in [facility]  C-SSRS RISK CATEGORY No Risk No Risk No Risk       Assessment and Plan: as follows  Prior documentation reviewed   Major depressive disorder recurrent moderate to severe: improved continue lamictal, celexa    Generalized anxiety disorder manageable continue celexa Refills sent Fu 27m.  11m, MD 12/11/20231:10 PM

## 2022-04-09 ENCOUNTER — Ambulatory Visit (HOSPITAL_COMMUNITY): Payer: 59 | Admitting: Licensed Clinical Social Worker

## 2022-04-09 ENCOUNTER — Encounter (HOSPITAL_COMMUNITY): Payer: Self-pay

## 2022-04-17 ENCOUNTER — Other Ambulatory Visit: Payer: Self-pay

## 2022-04-17 DIAGNOSIS — Z8639 Personal history of other endocrine, nutritional and metabolic disease: Secondary | ICD-10-CM

## 2022-04-17 MED ORDER — OZEMPIC (0.25 OR 0.5 MG/DOSE) 2 MG/3ML ~~LOC~~ SOPN: PEN_INJECTOR | SUBCUTANEOUS | 3 refills | Status: DC

## 2022-04-18 ENCOUNTER — Ambulatory Visit (HOSPITAL_COMMUNITY): Payer: 59 | Admitting: Licensed Clinical Social Worker

## 2022-04-25 ENCOUNTER — Ambulatory Visit (HOSPITAL_COMMUNITY): Payer: 59 | Admitting: Licensed Clinical Social Worker

## 2022-04-28 ENCOUNTER — Other Ambulatory Visit: Payer: Self-pay | Admitting: Family Medicine

## 2022-05-09 ENCOUNTER — Ambulatory Visit (INDEPENDENT_AMBULATORY_CARE_PROVIDER_SITE_OTHER): Payer: 59 | Admitting: Licensed Clinical Social Worker

## 2022-05-09 DIAGNOSIS — F39 Unspecified mood [affective] disorder: Secondary | ICD-10-CM

## 2022-05-09 DIAGNOSIS — Z87828 Personal history of other (healed) physical injury and trauma: Secondary | ICD-10-CM

## 2022-05-09 DIAGNOSIS — F411 Generalized anxiety disorder: Secondary | ICD-10-CM | POA: Diagnosis not present

## 2022-05-09 DIAGNOSIS — F331 Major depressive disorder, recurrent, moderate: Secondary | ICD-10-CM

## 2022-05-09 NOTE — Progress Notes (Signed)
Virtual Visit via Video Note  I connected with Alexander Osborne on 05/09/22 at 10:00 AM EST by a video enabled telemedicine application and verified that I am speaking with the correct person using two identifiers.  Location: Patient: home Provider: home office   I discussed the limitations of evaluation and management by telemedicine and the availability of in person appointments. The patient expressed understanding and agreed to proceed.   I discussed the assessment and treatment plan with the patient. The patient was provided an opportunity to ask questions and all were answered. The patient agreed with the plan and demonstrated an understanding of the instructions.   The patient was advised to call back or seek an in-person evaluation if the symptoms worsen or if the condition fails to improve as anticipated.  I provided 50 minutes of non-face-to-face time during this encounter.  THERAPIST PROGRESS NOTE  Session Time: 10:00 AM to 10:50 AM  Participation Level: Active  Behavioral Response: CasualAlertDysphoric  Type of Therapy: Individual Therapy  Treatment Goals addressed: Has to figure out plans for the future, still work on mood management, communication and how moods impact this, self-esteem, coping  ProgressTowards Goals: Progressing-worked on coping with depression looking for source of depression in developing coping strategies  Interventions: Solution Focused, Strength-based, Supportive, and Other: emotional regulationn  Summary: Alexander Osborne is a 42 y.o. male who presents with  has been a little down. Doesn't know if because [name] has a new boyfriend too. Follow on [employer] get butterflies see him with the guy feel like weird emotion. Still love him. Was in [place] with new boyfriend coming home was fine. Wednesday plans and after work ready to go to gym felt blah fell asleep, down that day next morning plan to go to gym didn't want to go yesterday went to work  and didn't go to gym. Talked to [facility] and then motivation to clean up. Feel a little unmoviated don't know if come to apartment and by himself and [place] place more homey. Don't know also saw the picture but saw before not sure what it is usually more in tune. All can do it go through it but not stay stuck with it. Stay up to 4 AM to clean and then [name] got him to gym. Feels a little better. Have been down since last talked. Holidays spent by himself. Did it to himself. Get down at the end of the year. Down practically week of Christmas to New Years thinks because people with family work in Engineering geologist. Sister went with other sister. Invitation with best friend said down don't want to bring down. Regretted it but don't want bring them down. People in family exchanging Christmas presents, best friend exchanging gifts with kids.  Therapist talked about strategy of making the most of what has does have family and one of the goals is to foster relationships. Didn't call anybody.  As we explored family's patient wants a family and relates biological clock ticking. Supposed to be focusing on himself not focus on these things.  Therapist challenged him on this that things are can happen that we are going to have feelings around them. Patient says don't want to be stuck for a long time has happened where felt down haven't felt like that in a long time. Long-term with [facility] plans just that he is far away. Patient says handling   patient's crazy. Understand where coming from very much a like. Practically me he is the nicer version of him.  Handling someone like himself is exhausted. In terms of handling feeling down just move through it. Don't think about the future when process what to look forward to therapist encouraged a hopeful thought about the future helps.  Meaning to file for the divorce. Saw [name] let's do it. Felt sad afterwards. Give butterflies but unlike [name] happy to move on. Not there yet with [name]. Not ok with  him having boyfriend brings out insecurities. Knows hard to be friendly and keep friendship. Patient asks does he feel like the same way about patient emotions that patient is having? Patient feels things not resolved something from him about what occurred not just patient's fault. Patient asks did he even love him? Difficult time letting go. Not want to be where he is with things want to move [place] start a new life. Hoping to settle down with current boyfriend. A moment where mean to him took it out had this idea of his future and keeps being taken away from him. Shouldn't be mean wasn't about his boyfriend. Have a fear that is going to happen again. Don't want to happen again. He is in it very supportive told patient break up was recent still going through it. Boyfriend supportive all about patient. Has reassured patient that wants to be with patient. Should not be projecting his insecurities. Patient ask has feelings for somebody and then attachment for other. Recognize it and accept it. Not stay stuck. Sometimes do something and fake it until make. Just talking helps.       Processing with patient feeling down explored sources it seems still grieving the loss of relationship as patient himself said to current boyfriend tired of his plans not working out the way he thought they would.  There is grieving when her hoped for plans do not turn out.  Therapist related the past is always being replayed in the unconscious in the present meaning he is applying past patterns to current so his current relationship is different from past relationships at the same time understanding does not want to be disappointed again.  Patient also ready to have a family.  Discussed ways of managing depression include distraction letting himself feel the feeling but not being stuck in the feeling acceptance of feeling there are things he is going to feel sad about and nothing he can do to control it just allow himself to feel  it to work through it.  Therapist also use positive reframing he is in a relationship he can see that long-term.  Noted is well patient putting his feelings into words and themselves help cope with feelings additionally finding meaning understanding can help find strategies to help cope.  Therapist provided active listening open questions supportive interventions. Suicidal/Homicidal: No  Plan: Return again in 4 weeks.2.  Helping patient process through issues that are impacting mood, continue to work on treatment goals  Diagnosis: unspecified mood disorder, major depressive disorder, recurrent, moderate, history of trauma, generalized anxiety disorder  Collaboration of Care: Medication Management AEB review of Dr. De Nurse last note  Patient/Guardian was advised Release of Information must be obtained prior to any record release in order to collaborate their care with an outside provider. Patient/Guardian was advised if they have not already done so to contact the registration department to sign all necessary forms in order for [place] to release information regarding their care.   Consent: Patient/Guardian gives verbal consent for treatment and assignment of benefits for services provided during this visit. Patient/Guardian expressed  understanding and agreed to proceed.   Cordella Register, LCSW 05/09/2022

## 2022-06-06 ENCOUNTER — Encounter (HOSPITAL_COMMUNITY): Payer: Self-pay

## 2022-06-06 ENCOUNTER — Ambulatory Visit (INDEPENDENT_AMBULATORY_CARE_PROVIDER_SITE_OTHER): Payer: 59 | Admitting: Licensed Clinical Social Worker

## 2022-06-06 ENCOUNTER — Encounter (HOSPITAL_COMMUNITY): Payer: Self-pay | Admitting: Licensed Clinical Social Worker

## 2022-06-06 DIAGNOSIS — Z87828 Personal history of other (healed) physical injury and trauma: Secondary | ICD-10-CM

## 2022-06-06 DIAGNOSIS — F411 Generalized anxiety disorder: Secondary | ICD-10-CM

## 2022-06-06 DIAGNOSIS — F331 Major depressive disorder, recurrent, moderate: Secondary | ICD-10-CM

## 2022-06-06 DIAGNOSIS — F39 Unspecified mood [affective] disorder: Secondary | ICD-10-CM

## 2022-06-06 NOTE — Progress Notes (Signed)
Therapist contacted patient for session and he did not show

## 2022-06-17 ENCOUNTER — Ambulatory Visit: Payer: Managed Care, Other (non HMO) | Admitting: Family Medicine

## 2022-06-25 ENCOUNTER — Other Ambulatory Visit: Payer: Self-pay | Admitting: Family Medicine

## 2022-06-25 DIAGNOSIS — Z8639 Personal history of other endocrine, nutritional and metabolic disease: Secondary | ICD-10-CM

## 2022-06-27 ENCOUNTER — Ambulatory Visit (HOSPITAL_COMMUNITY): Payer: 59 | Admitting: Licensed Clinical Social Worker

## 2022-06-27 ENCOUNTER — Telehealth (HOSPITAL_COMMUNITY): Payer: 59 | Admitting: Psychiatry

## 2022-06-30 ENCOUNTER — Ambulatory Visit: Payer: Managed Care, Other (non HMO) | Admitting: Family Medicine

## 2022-06-30 ENCOUNTER — Encounter (HOSPITAL_COMMUNITY): Payer: Self-pay | Admitting: Psychiatry

## 2022-06-30 ENCOUNTER — Telehealth (INDEPENDENT_AMBULATORY_CARE_PROVIDER_SITE_OTHER): Payer: 59 | Admitting: Psychiatry

## 2022-06-30 DIAGNOSIS — F331 Major depressive disorder, recurrent, moderate: Secondary | ICD-10-CM

## 2022-06-30 DIAGNOSIS — F39 Unspecified mood [affective] disorder: Secondary | ICD-10-CM | POA: Diagnosis not present

## 2022-06-30 DIAGNOSIS — F411 Generalized anxiety disorder: Secondary | ICD-10-CM

## 2022-06-30 MED ORDER — CITALOPRAM HYDROBROMIDE 10 MG PO TABS: ORAL_TABLET | ORAL | 0 refills | Status: DC

## 2022-06-30 MED ORDER — LAMOTRIGINE 25 MG PO TABS: ORAL_TABLET | ORAL | 2 refills | Status: DC

## 2022-06-30 NOTE — Progress Notes (Signed)
[facility] Follow Up Visit  Patient Identification: Alexander Osborne MRN:  [id number] Date of Evaluation:  06/30/2022 Referral Source: hospital discharge Chief Complaint:  follow up  depression, mood symptoms Visit Diagnosis:    ICD-10-CM   1. Unspecified mood (affective) disorder (HCC)  F39 citalopram (CELEXA) 10 MG tablet    2. GAD (generalized anxiety disorder)  F41.1     3. MDD (major depressive disorder), recurrent episode, moderate ([place])  F33.1     Virtual Visit via Video Note  I connected with Dorus Bruschi on 06/30/22 at  4:30 PM EDT by a video enabled telemedicine application and verified that I am speaking with the correct person using two identifiers.  Location: Patient: home Provider: home office   I discussed the limitations of evaluation and management by telemedicine and the availability of in person appointments. The patient expressed understanding and agreed to proceed.      I discussed the assessment and treatment plan with the patient. The patient was provided an opportunity to ask questions and all were answered. The patient agreed with the plan and demonstrated an understanding of the instructions.   The patient was advised to call back or seek an in-person evaluation if the symptoms worsen or if the condition fails to improve as anticipated.  I provided 15  minutes of non-face-to-face time during this encounter.        History of Present Illness: Patient is a 42  years old currently single male referred by hospital discharge with OD admission for one day  He works full-time at [employer] fair handling stress,  Working on relationships and positive thinking May need another therapist,   Working on self growth    Aggravating factors; past relationship  Modifying factors : relationship   Severity fair    Past Psychiatric History: depression  Previous Psychotropic Medications: Yes   Substance Abuse History in the  last 12 months:  Yes.    Consequences of Substance Abuse: Alcohol use not regular, but discussed its effect on depression  Past Medical History:  Past Medical History:  Diagnosis Date   Allergy    Anxiety    Asthma    Depression    Diabetes mellitus without complication ([place])     Past Surgical History:  Procedure Laterality Date   corneal transplant left     EYE SURGERY      Family Psychiatric History: brother : bipolar, sister, depression  Family History:  Family History  Problem Relation Age of Onset   Cancer Mother 80       pancreatic cancer   Hypertension Father    Heart disease Father 55       CAD   Bipolar disorder Brother    Diabetes Sister    Allergic rhinitis Neg Hx    Angioedema Neg Hx    Asthma Neg Hx    Eczema Neg Hx    Immunodeficiency Neg Hx    Urticaria Neg Hx     Social History:   Social History   Socioeconomic History   Marital status: Legally Separated    Spouse name: Not on file   Number of children: 0   Years of education: Not on file   Highest education level: Not on file  Occupational History   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  Tobacco Use   Smoking status: Former    Packs/day: 0.25    Years: 11.00    Total pack years: 2.75  Types: Cigarettes    Quit date: 10/03/2017    Years since quitting: 4.7   Smokeless tobacco: Never  Vaping Use   Vaping Use: Never used  Substance and Sexual Activity   Alcohol use: Yes    Alcohol/week: 13.0 standard drinks of alcohol    Types: 12 Cans of beer, 1 Standard drinks or equivalent per week   Drug use: No   Sexual activity: Yes    Partners: Male    Birth control/protection: None    Comment: SS partner  Other Topics Concern   Not on file  Social History Narrative   Marital status: married x 4 years; happily married.       Lives; with husbnad      Employment: Librarian, academic at [employer]: socially; weekends      Alcohol: 1-3 drinks per week       Drugs: none      Exercise: none   Denies caffeine use    Social Determinants of Radio broadcast assistant Strain: Not on file  Food Insecurity: Not on file  Transportation Needs: Not on file  Physical Activity: Not on file  Stress: Not on file  Social Connections: Not on file     Allergies:   Allergies  Allergen Reactions   Fish-Derived Products Other (See Comments)    Allergic, but reaction not recalled Confirmed thru testing   Shellfish Allergy Other (See Comments)    Patient does not recall the reaction Confirmed thru testing    Metabolic Disorder Labs: Lab Results  Component Value Date   HGBA1C 5.9 03/17/2022   MPG 143 05/23/2021   MPG 120 03/04/2021   No results found for: "PROLACTIN" Lab Results  Component Value Date   CHOL 198 05/23/2021   TRIG 131 05/23/2021   HDL 48 05/23/2021   CHOLHDL 4.1 05/23/2021   VLDL 25 11/02/2020   LDLCALC 125 (H) 05/23/2021   LDLCALC 114 (H) 03/04/2021   Lab Results  Component Value Date   TSH 1.06 05/23/2021    Therapeutic Level Labs: No results found for: "LITHIUM" No results found for: "CBMZ" No results found for: "VALPROATE"  Current Medications: Current Outpatient Medications  Medication Sig Dispense Refill   albuterol (VENTOLIN HFA) 108 (90 Base) MCG/ACT inhaler USE 2 INHALATIONS EVERY 6 HOURS AS NEEDED FOR WHEEZING OR SHORTNESS OF BREATH 8.5 g 10   amoxicillin-clavulanate (AUGMENTIN) 875-125 MG tablet Take 1 tablet by mouth every 12 (twelve) hours. 14 tablet 0   citalopram (CELEXA) 10 MG tablet TAKE 1 TABLET(10 MG) BY MOUTH DAILY 90 tablet 0   DESCOVY 200-25 MG tablet Take 1 tablet by mouth daily. 90 tablet 0   EPINEPHrine 0.3 mg/0.3 mL IJ SOAJ injection Inject 0.3 mg into the muscle as needed for anaphylaxis. 1 each 1   Fexofenadine HCl (ALLEGRA PO) Take 1 tablet by mouth daily.     lamoTRIgine (LAMICTAL) 25 MG tablet TAKE 3 TABLETS (75 MG TOTAL) BY MOUTH DAILY. 90 tablet 2   mometasone (NASONEX) 50 MCG/ACT  nasal spray Place 2 sprays into the nose daily as needed (for allergies).     omeprazole (PRILOSEC) 40 MG capsule Take 1 capsule by mouth daily.     Semaglutide,0.25 or 0.'5MG'$ /DOS, (OZEMPIC, 0.25 OR 0.5 MG/DOSE,) 2 MG/3ML SOPN INJECT 0.'25MG'$  WEEKLY X4 WEEKS THEN INCREASE TO 0.'5MG'$  WEEKLY 3 mL 1   sildenafil (VIAGRA) 50 MG tablet Take 50 mg by mouth daily as needed for erectile dysfunction.  No current facility-administered medications for this visit.     Psychiatric Specialty Exam: Review of Systems  Cardiovascular:  Negative for chest pain.  Skin:  Negative for rash.  Psychiatric/Behavioral:  Negative for agitation, decreased concentration, hallucinations and self-injury.     There were no vitals taken for this visit.There is no height or weight on file to calculate BMI.  General Appearance: Casual  Eye Contact:  Good  Speech:  Clear and Coherent  Volume:  Normal  Mood: improving  Affect:  Congruent  Thought Process:  Goal Directed  Orientation:  Full (Time, Place, and Person)  Thought Content:  Rumination  Suicidal Thoughts:  No  Homicidal Thoughts:  No  Memory:  Immediate;   Fair  Judgement:  Fair  Insight:  Fair  Psychomotor Activity:  Normal  Concentration:  Concentration: Fair  Recall:  Good  Fund of Knowledge:Good  Language: Good  Akathisia:  No  Handed:   AIMS (if indicated):  not done  Assets:  Desire for Improvement Physical Health  ADL's:  Intact  Cognition: WNL  Sleep:  Fair   Screenings: AIMS    Flowsheet Row Admission (Discharged) from 11/01/2020 in [facility]  AIMS Total Score 0      AUDIT    Flowsheet Row Admission (Discharged) from 11/01/2020 in [place] [facility]  Alcohol Use Disorder Identification Test Final Score (AUDIT) 3      GAD-7    Flowsheet Row Office Visit from 12/13/2021 in [place] at [place] Visit from 03/04/2021  in [place] at [place] Visit from 10/18/2020 in [place] at [facility]  Total GAD-7 Score '4 6 7      '$ PHQ2-9    [place] Office Visit from 03/17/2022 in [place] [facility] [place] Visit from 12/13/2021 in [place] at [place] Visit from 09/10/2021 in [place] at [place] Visit from 03/04/2021 in [place] [facility] [place] from 01/11/2021 in [place] at [facility]  PHQ-2 Total Score 0 2 0 4 6  PHQ-9 Total Score 0 8 -- 9 [place] ED from 01/24/2022 in [facility] at [facility]) Video Visit from 12/10/2021 in [place] at [facility] ED from 10/22/2021 in [facility])  Arabi No Risk No Risk No Risk       Assessment and Plan: as follows  Prior documentation reviewed   Major depressive disorder recurrent moderate to severe: manageable conitnue lamictal and celexa  No rash    Generalized anxiety disorder manageable continue celexa Refills sent Fu 48m  NMerian Capron MD 3/11/20244:44 PM

## 2022-07-03 ENCOUNTER — Ambulatory Visit (HOSPITAL_COMMUNITY): Payer: 59 | Admitting: Licensed Clinical Social Worker

## 2022-07-09 ENCOUNTER — Encounter: Payer: Self-pay | Admitting: Family Medicine

## 2022-07-09 ENCOUNTER — Ambulatory Visit (INDEPENDENT_AMBULATORY_CARE_PROVIDER_SITE_OTHER): Payer: Managed Care, Other (non HMO) | Admitting: Family Medicine

## 2022-07-09 VITALS — BP 117/77 | HR 79 | Ht 70.0 in | Wt 237.0 lb

## 2022-07-09 DIAGNOSIS — Z8639 Personal history of other endocrine, nutritional and metabolic disease: Secondary | ICD-10-CM | POA: Diagnosis not present

## 2022-07-09 DIAGNOSIS — F332 Major depressive disorder, recurrent severe without psychotic features: Secondary | ICD-10-CM | POA: Diagnosis not present

## 2022-07-09 DIAGNOSIS — Z79899 Other long term (current) drug therapy: Secondary | ICD-10-CM | POA: Diagnosis not present

## 2022-07-09 MED ORDER — DESCOVY 200-25 MG PO TABS: 1.0000 | ORAL_TABLET | Freq: Every day | ORAL | 0 refills | Status: DC

## 2022-07-09 NOTE — Assessment & Plan Note (Signed)
Consistent with Descovy.  Updating renal function and HIV antibody.  He is aware that he needs to have HIV antibody rechecked again in 3 months.

## 2022-07-09 NOTE — Progress Notes (Signed)
Alexander Osborne - 42 y.o. male MRN [id number]  Date of birth: [birth year]  Subjective Chief Complaint  Patient presents with   Labs Only   Orthopedic Consult    HPI Alexander Osborne is a 42 year old male here today for follow-up visit.  Reports he is doing fairly well.  He is consistent with taking his Descovy for PrEP.  Due for updated HIV testing.  Denies any symptoms of other STIs at this time.  Remains on semaglutide for management of diabetes.  He is tolerating this well at current strength.  His weight is up some since last visit.  He does continue to exercise fairly regularly.  Patient seeing psychiatry for management of antidepressants.  Stable at this time.  ROS:  A comprehensive ROS was completed and negative except as noted per HPI  Allergies  Allergen Reactions   Fish-Derived Products Other (See Comments)    Allergic, but reaction not recalled Confirmed thru testing   Shellfish Allergy Other (See Comments)    Patient does not recall the reaction Confirmed thru testing    Past Medical History:  Diagnosis Date   Allergy    Anxiety    Asthma    Depression    Diabetes mellitus without complication ([place])     Past Surgical History:  Procedure Laterality Date   corneal transplant left     EYE SURGERY      Social History   Socioeconomic History   Marital status: Legally Separated    Spouse name: Not on file   Number of children: 0   Years of education: Not on file   Highest education level: Not on file  Occupational History   Occupation: Freight forwarder    Employer: [employer]  Tobacco Use   Smoking status: Former    Packs/day: 0.25    Years: 11.00    Additional pack years: 0.00    Total pack years: 2.75    Types: Cigarettes    Quit date: 10/03/2017    Years since quitting: 4.7   Smokeless tobacco: Never  Vaping Use   Vaping Use: Never used  Substance and Sexual Activity   Alcohol use: Yes    Alcohol/week: 13.0 standard drinks of alcohol     Types: 12 Cans of beer, 1 Standard drinks or equivalent per week   Drug use: No   Sexual activity: Yes    Partners: Male    Birth control/protection: None    Comment: SS partner  Other Topics Concern   Not on file  Social History Narrative   Marital status: married x 4 years; happily married.       Lives; with husbnad      Employment: Librarian, academic at [employer]: socially; weekends      Alcohol: 1-3 drinks per week      Drugs: none      Exercise: none   Denies caffeine use    Social Determinants of Radio broadcast assistant Strain: Not on file  Food Insecurity: Not on file  Transportation Needs: Not on file  Physical Activity: Not on file  Stress: Not on file  Social Connections: Not on file    Family History  Problem Relation Age of Onset   Cancer Mother 70       pancreatic cancer   Hypertension Father    Heart disease Father 48       CAD   Bipolar disorder Brother    Diabetes Sister  Allergic rhinitis Neg Hx    Angioedema Neg Hx    Asthma Neg Hx    Eczema Neg Hx    Immunodeficiency Neg Hx    Urticaria Neg Hx     Health Maintenance  Topic Date Due   Diabetic kidney evaluation - eGFR measurement  05/23/2022   Hepatitis C Screening  12/14/2022 (Originally 12/30/2000)   OPHTHALMOLOGY EXAM  01/09/2023 (Originally 01/06/2020)   COVID-19 Vaccine (5 - 2023-24 season) 07/25/2023 (Originally 05/12/2022)   HEMOGLOBIN A1C  09/15/2022   Diabetic kidney evaluation - Urine ACR  12/14/2022   FOOT EXAM  12/14/2022   DTaP/Tdap/Td (2 - Td or Tdap) 04/30/2027   INFLUENZA VACCINE  Completed   HIV Screening  Completed   HPV VACCINES  Aged Out     ----------------------------------------------------------------------------------------------------------------------------------------------------------------------------------------------------------------- Physical Exam BP 117/77 (BP Location: Right Arm, Patient Position: Sitting, Cuff Size: Large)    Pulse 79   Ht 5\' 10"  (1.778 m)   Wt 237 lb (107.5 kg)   SpO2 96%   BMI 34.01 kg/m   Physical Exam Constitutional:      Appearance: Normal appearance.  HENT:     Head: Normocephalic and atraumatic.  Eyes:     General: No scleral icterus. Cardiovascular:     Rate and Rhythm: Normal rate and regular rhythm.  Pulmonary:     Effort: Pulmonary effort is normal.     Breath sounds: Normal breath sounds.  Musculoskeletal:     Cervical back: Neck supple.  Neurological:     Mental Status: He is alert.  Psychiatric:        Mood and Affect: Mood normal.        Behavior: Behavior normal.     ------------------------------------------------------------------------------------------------------------------------------------------------------------------------------------------------------------------- Assessment and Plan  History of type 2 diabetes mellitus Continues on Ozempic.  Tolerating well.  Updated A1c ordered.  Will plan to continue this 0.5 mg weekly.  MDD (major depressive disorder), recurrent severe, without psychosis ([place]) Management per psychiatry.  Stable at this time.  On pre-exposure prophylaxis for HIV Consistent with Descovy.  Updating renal function and HIV antibody.  He is aware that he needs to have HIV antibody rechecked again in 3 months.   Meds ordered this encounter  Medications   DESCOVY 200-25 MG tablet    Sig: Take 1 tablet by mouth daily.    Dispense:  90 tablet    Refill:  0    Return in about 6 months (around 01/09/2023) for F/u PrEP/T2DM.    This visit occurred during the SARS-CoV-2 public health emergency.  Safety protocols were in place, including screening questions prior to the visit, additional usage of staff PPE, and extensive cleaning of exam room while observing appropriate contact time as indicated for disinfecting solutions.

## 2022-07-09 NOTE — Assessment & Plan Note (Signed)
Management per psychiatry.  Stable at this time. ?

## 2022-07-09 NOTE — Assessment & Plan Note (Addendum)
Continues on Ozempic.  Tolerating well.  Updated A1c ordered.  Will plan to continue this 0.5 mg weekly.

## 2022-07-10 LAB — COMPLETE METABOLIC PANEL WITH GFR
AG Ratio: 1.5 (calc) (ref 1.0–2.5)
ALT: 25 U/L (ref 9–46)
AST: 20 U/L (ref 10–40)
Albumin: 4.7 g/dL (ref 3.6–5.1)
Alkaline phosphatase (APISO): 61 U/L (ref 36–130)
BUN: 19 mg/dL (ref 7–25)
CO2: 25 mmol/L (ref 20–32)
Calcium: 9.5 mg/dL (ref 8.6–10.3)
Chloride: 104 mmol/L (ref 98–110)
Creat: 0.87 mg/dL (ref 0.60–1.26)
Globulin: 3.1 g/dL (calc) (ref 1.9–3.7)
Glucose, Bld: 119 mg/dL — ABNORMAL HIGH (ref 65–99)
Potassium: 4.4 mmol/L (ref 3.5–5.3)
Sodium: 141 mmol/L (ref 135–146)
Total Bilirubin: 0.8 mg/dL (ref 0.2–1.2)
Total Protein: 7.8 g/dL (ref 6.1–8.1)
eGFR: 113 mL/min/{1.73_m2} (ref >=60)

## 2022-07-10 LAB — LIPID PANEL W/REFLEX DIRECT LDL
Cholesterol: 174 mg/dL (ref <200)
HDL: 42 mg/dL (ref >=40)
LDL Cholesterol (Calc): 110 mg/dL (calc) — ABNORMAL HIGH
Non-HDL Cholesterol (Calc): 132 mg/dL (calc) — ABNORMAL HIGH (ref <130)
Total CHOL/HDL Ratio: 4.1 (calc) (ref <5.0)
Triglycerides: 116 mg/dL (ref <150)

## 2022-07-10 LAB — HEMOGLOBIN A1C
Hgb A1c MFr Bld: 6.1 % of total Hgb — ABNORMAL HIGH (ref <5.7)
Mean Plasma Glucose: 128 mg/dL
eAG (mmol/L): 7.1 mmol/L

## 2022-07-10 LAB — CBC WITH DIFFERENTIAL/PLATELET
Absolute Monocytes: 450 cells/uL (ref 200–950)
Basophils Absolute: 48 cells/uL (ref 0–200)
Basophils Relative: 0.8 %
Eosinophils Absolute: 228 cells/uL (ref 15–500)
Eosinophils Relative: 3.8 %
HCT: 44.7 % (ref 38.5–50.0)
Hemoglobin: 15.8 g/dL (ref 13.2–17.1)
Lymphs Abs: 2004 cells/uL (ref 850–3900)
MCH: 30.6 pg (ref 27.0–33.0)
MCHC: 35.3 g/dL (ref 32.0–36.0)
MCV: 86.6 fL (ref 80.0–100.0)
MPV: 11.9 fL (ref 7.5–12.5)
Monocytes Relative: 7.5 %
Neutro Abs: 3270 cells/uL (ref 1500–7800)
Neutrophils Relative %: 54.5 %
Platelets: 222 10*3/uL (ref 140–400)
RBC: 5.16 10*6/uL (ref 4.20–5.80)
RDW: 13.3 % (ref 11.0–15.0)
Total Lymphocyte: 33.4 %
WBC: 6 10*3/uL (ref 3.8–10.8)

## 2022-07-10 LAB — HIV ANTIBODY (ROUTINE TESTING W REFLEX): HIV 1&2 Ab, 4th Generation: NONREACTIVE

## 2022-07-17 ENCOUNTER — Ambulatory Visit (HOSPITAL_COMMUNITY): Payer: 59 | Admitting: Licensed Clinical Social Worker

## 2022-07-25 ENCOUNTER — Encounter: Payer: Managed Care, Other (non HMO) | Admitting: Sports Medicine

## 2022-07-30 ENCOUNTER — Ambulatory Visit (INDEPENDENT_AMBULATORY_CARE_PROVIDER_SITE_OTHER): Payer: Managed Care, Other (non HMO) | Admitting: Sports Medicine

## 2022-07-30 ENCOUNTER — Ambulatory Visit: Payer: Managed Care, Other (non HMO)

## 2022-07-30 DIAGNOSIS — G5603 Carpal tunnel syndrome, bilateral upper limbs: Secondary | ICD-10-CM

## 2022-07-30 DIAGNOSIS — M25512 Pain in left shoulder: Secondary | ICD-10-CM | POA: Diagnosis not present

## 2022-07-30 DIAGNOSIS — M5030 Other cervical disc degeneration, unspecified cervical region: Secondary | ICD-10-CM | POA: Insufficient documentation

## 2022-07-30 DIAGNOSIS — G8929 Other chronic pain: Secondary | ICD-10-CM

## 2022-07-30 MED ORDER — PREDNISONE 50 MG PO TABS
ORAL_TABLET | ORAL | 0 refills | Status: DC
Start: 2022-07-30 — End: 2022-08-16

## 2022-07-30 MED ORDER — MELOXICAM 15 MG PO TABS
ORAL_TABLET | ORAL | 3 refills | Status: DC
Start: 2022-07-30 — End: 2023-02-06

## 2022-07-30 NOTE — Assessment & Plan Note (Signed)
Pleasant 42 year old male, EMG and nerve conduction study confirmed bilateral carpal tunnel syndrome. He started out with nighttime splinting and improved, since then he stopped splinting, having recurrence of symptoms but would like to try activity modification and stretches before returning to splinting at night. If insufficient improvement over a month we will return to nighttime splinting and potentially consider bilateral median nerve hydrodissection's.

## 2022-07-30 NOTE — Progress Notes (Signed)
    Procedures performed today:    None.  Independent interpretation of notes and tests performed by another provider:   None.  Brief History, Exam, Impression, and Recommendations:    Bilateral carpal tunnel syndrome Pleasant 42 year old male, EMG and nerve conduction study confirmed bilateral carpal tunnel syndrome. He started out with nighttime splinting and improved, since then he stopped splinting, having recurrence of symptoms but would like to try activity modification and stretches before returning to splinting at night. If insufficient improvement over a month we will return to nighttime splinting and potentially consider bilateral median nerve hydrodissection's.  Left distal clavicular osteolysis This is a pleasant 42 year old male, he has had pain in left acromioclavicular joint since November, worse when working out particularly overhead press, crossarm workouts and butterflies. On exam he is tenderness at the acromioclavicular joint. Likely distal clavicular osteolysis, he will hold off on overhead and crossarm activities for now, we will do a 5-day burst of prednisone, x-rays. Return to see me in about 4 to 6 weeks, will consider injection if not better.  DDD (degenerative disc disease), cervical Also having neck pain with radiation down both arms with numbness and tingling, suspect cervical degenerative disease, adding cervical spine x-rays, home conditioning, prednisone as below. Return to see me in 6 weeks, MR if no better.    ____________________________________________ Ihor Austin. Benjamin Stain, M.D., ABFM., CAQSM., AME. Primary Care and Sports Medicine [facility]  Adjunct Professor of Family Medicine  [place] [employer] [facility] [employer]  Restaurant manager, fast food

## 2022-07-30 NOTE — Assessment & Plan Note (Signed)
This is a pleasant 42 year old male, he has had pain in left acromioclavicular joint since November, worse when working out particularly overhead press, crossarm workouts and butterflies. On exam he is tenderness at the acromioclavicular joint. Likely distal clavicular osteolysis, he will hold off on overhead and crossarm activities for now, we will do a 5-day burst of prednisone, x-rays. Return to see me in about 4 to 6 weeks, will consider injection if not better.

## 2022-07-30 NOTE — Assessment & Plan Note (Signed)
Also having neck pain with radiation down both arms with numbness and tingling, suspect cervical degenerative disease, adding cervical spine x-rays, home conditioning, prednisone as below. Return to see me in 6 weeks, MR if no better.

## 2022-08-01 ENCOUNTER — Encounter: Payer: Managed Care, Other (non HMO) | Admitting: Sports Medicine

## 2022-08-16 ENCOUNTER — Ambulatory Visit (HOSPITAL_COMMUNITY)
Admission: EM | Admit: 2022-08-16 | Discharge: 2022-08-16 | Disposition: A | Discharge status: Home / Self Care | Payer: Managed Care, Other (non HMO) | Attending: Emergency Medicine | Admitting: Emergency Medicine

## 2022-08-16 ENCOUNTER — Encounter (HOSPITAL_COMMUNITY): Payer: Self-pay

## 2022-08-16 ENCOUNTER — Encounter: Payer: Self-pay | Admitting: Family Medicine

## 2022-08-16 DIAGNOSIS — J4521 Mild intermittent asthma with (acute) exacerbation: Secondary | ICD-10-CM | POA: Diagnosis not present

## 2022-08-16 DIAGNOSIS — Z8639 Personal history of other endocrine, nutritional and metabolic disease: Secondary | ICD-10-CM

## 2022-08-16 MED ORDER — IPRATROPIUM-ALBUTEROL 0.5-2.5 (3) MG/3ML IN SOLN
RESPIRATORY_TRACT | Status: AC
  Filled 2022-08-16: qty 3

## 2022-08-16 MED ORDER — PREDNISONE 20 MG PO TABS: 40.0000 mg | ORAL_TABLET | Freq: Every day | ORAL | 0 refills | Status: AC

## 2022-08-16 MED ORDER — IPRATROPIUM-ALBUTEROL 0.5-2.5 (3) MG/3ML IN SOLN
3.0000 mL | Freq: Once | RESPIRATORY_TRACT | Status: AC
  Administered 2022-08-16: 3 mL via RESPIRATORY_TRACT

## 2022-08-16 NOTE — ED Triage Notes (Signed)
Wheezing, chest heaviness, and dry cough. Onset wheezing this morning, cough ongoing for a while. Patient has allergies to his dog, used his inhaler 6-8 times with little relief.  Having emesis, headache, and eye pressure onset this afternoon.

## 2022-08-16 NOTE — ED Provider Notes (Signed)
[facility]    CSN: [id number] Arrival date & time: 08/16/22  1306      History   Chief Complaint Chief Complaint  Patient presents with   Wheezing    Chest is heavy. Slight cough. Used inhaler about 6 to 8 times since 11 am. - Entered by patient    HPI Alexander Osborne is a 42 y.o. male.   Reports wheezing and dry cough that has been exacerbated by his dog, who he is allergic to.  He has been caring for his dog for the past few days.  Reports his Christin Bach is leaving to the other caretaker soon.  He does not allow the dog into his bedroom her on his bed.  Patient has used his albuterol inhaler multiple times this morning for wheezing and shortness of breath.  He reports a dry cough.  Denies fevers.  The history is provided by the patient and medical records.  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   MDD (major depressive disorder), recurrent severe, without psychosis (HCC) 11/01/2020   Bilateral carpal tunnel syndrome 10/21/2020   Routine screening for STI (sexually transmitted infection) 10/21/2020   History of type 2 diabetes mellitus 10/21/2020   Laryngopharyngeal reflux 10/18/2020   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 Authorizing Provider  albuterol (VENTOLIN HFA) 108 (90 Base) MCG/ACT inhaler USE 2 INHALATIONS EVERY 6 HOURS AS NEEDED FOR WHEEZING OR SHORTNESS OF BREATH 04/29/22  Yes Everrett Coombe, DO  citalopram (CELEXA) 10 MG tablet TAKE 1 TABLET(10 MG) BY MOUTH DAILY 06/30/22  Yes Thresa Ross, MD  DESCOVY 200-25 MG tablet Take 1 tablet by mouth daily. 07/09/22  Yes Everrett Coombe, DO  Fexofenadine HCl (ALLEGRA PO) Take 1 tablet by mouth daily.   Yes [provider]  lamoTRIgine (LAMICTAL) 25 MG tablet TAKE 3 TABLETS (75 MG TOTAL) BY MOUTH DAILY. 06/30/22  Yes Thresa Ross, MD  meloxicam (MOBIC) 15 MG tablet One tab PO every 24 hours with a meal for 2 weeks, then once every 24 hours prn pain. 07/30/22  Yes Monica Becton, MD  mometasone (NASONEX) 50 MCG/ACT nasal spray Place 2 sprays into the nose daily as needed (for allergies).   Yes [provider]  omeprazole (PRILOSEC) 40 MG capsule Take 1 capsule by mouth daily. 12/04/20  Yes [provider]  predniSONE (DELTASONE) 20 MG tablet Take 2 tablets (40 mg total) by mouth daily for 5 days. 08/16/22 08/21/22 Yes Rinaldo Ratel, [place] N, FNP  Semaglutide,0.25 or 0.5MG /DOS, (OZEMPIC, 0.25 OR 0.5 MG/DOSE,) 2 MG/3ML SOPN INJECT 0.25MG   WEEKLY X4 WEEKS THEN INCREASE TO 0.5MG  WEEKLY Patient taking differently: Take 0.5 mg by mouth once a week. INJECT 0.5MG  WEEKLY 06/25/22  Yes Everrett Coombe, DO  sildenafil (VIAGRA) 50 MG tablet Take 50 mg by mouth daily as needed for erectile dysfunction.   Yes [provider]  EPINEPHrine 0.3 mg/0.3 mL IJ SOAJ injection Inject 0.3 mg into the muscle as needed for anaphylaxis. 03/17/22   Everrett Coombe, DO  FLUoxetine (PROZAC) 40 MG capsule Take 1 capsule (40 mg total) by mouth daily. 11/02/20 11/26/20  Antonieta Pert, MD  traZODone (DESYREL) 50 MG tablet Take 50-100 mg by mouth at bedtime as needed. 11/21/20  01/23/21  [provider]    Family History Family History  Problem Relation Age of Onset   Cancer Mother 36       pancreatic cancer   Hypertension Father    Heart disease Father 32       CAD   Bipolar disorder Brother    Diabetes Sister    Allergic rhinitis Neg Hx    Angioedema Neg Hx    Asthma Neg Hx    Eczema Neg Hx    Immunodeficiency Neg Hx    Urticaria Neg Hx     Social History Social History   Tobacco Use   Smoking status: Former    Packs/day: 0.25    Years: 11.00    Additional pack years: 0.00    Total pack years: 2.75    Types: Cigarettes    Quit date: 10/03/2017    Years since quitting: 4.8   Smokeless tobacco: Never  Vaping Use   Vaping Use: Never used  Substance Use Topics   Alcohol use: Yes    Alcohol/week: 13.0 standard drinks of alcohol    Types: 12 Cans of beer, 1 Standard drinks or equivalent per week   Drug use: No     Allergies   Fish-derived products and Shellfish allergy   Review of Systems Review of Systems  Constitutional:  Negative for fever.  HENT:  Negative for sore throat.   Respiratory:  Positive for cough, shortness of breath and wheezing.   Cardiovascular:  Negative for chest pain.  Gastrointestinal:  Negative for abdominal pain.     Physical Exam Triage Vital Signs ED Triage Vitals  Enc Vitals Group     BP 08/16/22 1321 134/82     Pulse Rate 08/16/22 1321 93     Resp 08/16/22 1321 20     Temp 08/16/22 1321 98.4 F (36.9 C)     Temp Source 08/16/22 1321 Oral     SpO2 08/16/22 1321 95 %     Weight 08/16/22 1321 240 lb (108.9 kg)     Height 08/16/22 1321 5' 9.5" (1.765 m)     Head Circumference --      Peak Flow --      Pain Score 08/16/22 1319 4     Pain Loc --      Pain Edu? --      Excl. in GC? --    No data found.  Updated Vital Signs BP 134/82 (BP Location: Left Arm)   Pulse 93   Temp 98.4 F (36.9 C) (Oral)   Resp 20   Ht 5' 9.5" (1.765 m)   Wt 240 lb (108.9 kg)   SpO2 95%   BMI 34.93  kg/m   Visual Acuity Right Eye Distance:   Left Eye Distance:   Bilateral Distance:    Right Eye Near:  Left Eye Near:    Bilateral Near:     Physical Exam Vitals and nursing note reviewed.  Constitutional:      Appearance: Normal appearance.  HENT:     Head: Normocephalic and atraumatic.     Right Ear: External ear normal.     Left Ear: External ear normal.     Nose: Nose normal.     Mouth/Throat:     Mouth: Mucous membranes are moist.  Eyes:     General: No scleral icterus.    Conjunctiva/sclera: Conjunctivae normal.  Cardiovascular:     Rate and Rhythm: Normal rate and regular rhythm.     Heart sounds: No murmur heard. Pulmonary:     Effort: Pulmonary effort is normal. No respiratory distress.     Breath sounds: Wheezing present.  Musculoskeletal:        General: No swelling. Normal range of motion.     Cervical back: Normal range of motion.  Skin:    General: Skin is warm and dry.  Neurological:     General: No focal deficit present.     Mental Status: He is alert and oriented to person, place, and time.  Psychiatric:        Mood and Affect: Mood normal.        Behavior: Behavior normal.      UC Treatments / Results  Labs (all labs ordered are listed, but only abnormal results are displayed) Labs Reviewed - No data to display  EKG   Radiology No results found.  Procedures Procedures (including critical care time)  Medications Ordered in UC Medications  ipratropium-albuterol (DUONEB) 0.5-2.5 (3) MG/3ML nebulizer solution 3 mL (3 mLs Nebulization Given 08/16/22 1345)    Initial Impression / Assessment and Plan / UC Course  I have reviewed the triage vital signs and the nursing notes.  Pertinent labs & imaging results that were available during my care of the patient were reviewed by me and considered in my medical decision making (see chart for details).  Vitals and triage reviewed, patient is hemodynamically stable.  Oxygenation 96% on room  air, no acute distress.  Able to speak in full sentences.  Minor expiratory wheezing in upper lobes on auscultation.  Patient given a DuoNeb in the clinic, reassessed and found to have improved. Discussed avoiding of allergens and triggers.  Given steroid burst.  Return and follow-up precautions discussed, no questions at this time.     Final Clinical Impressions(s) / UC Diagnoses   Final diagnoses:  Mild intermittent asthma with exacerbation     Discharge Instructions      We treated you for a mild asthma exacerbation today with a DuoNeb.  Please start the prednisone, you can take it with lunch today.  Please take it in the morning with breakfast for the next 4 days.  It is best to avoid your triggers when you have asthma, please follow-up with your primary care provider for further evaluation and maintenance.  You can use your albuterol inhaler as a rescue inhaler 1 to 2 puffs every 6 hours as needed for wheezing or shortness of breath.  Please seek immediate care if you develop worsening shortness of breath, or any new concerning symptoms.      ED Prescriptions     Medication Sig Dispense Auth. Provider   predniSONE (DELTASONE) 20 MG tablet Take 2 tablets (40 mg total) by mouth daily for 5 days. 10 tablet Konner Saiz, [place] N, FNP      PDMP not  reviewed this encounter.   Sherrel Shafer, [place] N, [place] 08/16/22 [phone]

## 2022-08-16 NOTE — Discharge Instructions (Signed)
We treated you for a mild asthma exacerbation today with a DuoNeb.  Please start the prednisone, you can take it with lunch today.  Please take it in the morning with breakfast for the next 4 days.  It is best to avoid your triggers when you have asthma, please follow-up with your primary care provider for further evaluation and maintenance.  You can use your albuterol inhaler as a rescue inhaler 1 to 2 puffs every 6 hours as needed for wheezing or shortness of breath.  Please seek immediate care if you develop worsening shortness of breath, or any new concerning symptoms.

## 2022-08-18 MED ORDER — OZEMPIC (0.25 OR 0.5 MG/DOSE) 2 MG/3ML ~~LOC~~ SOPN
0.5000 mg | PEN_INJECTOR | SUBCUTANEOUS | 1 refills | Status: DC
Start: 2022-08-18 — End: 2023-02-06

## 2022-09-12 ENCOUNTER — Ambulatory Visit (INDEPENDENT_AMBULATORY_CARE_PROVIDER_SITE_OTHER): Payer: Managed Care, Other (non HMO) | Admitting: Sports Medicine

## 2022-09-12 DIAGNOSIS — G5603 Carpal tunnel syndrome, bilateral upper limbs: Secondary | ICD-10-CM

## 2022-09-12 DIAGNOSIS — M25512 Pain in left shoulder: Secondary | ICD-10-CM

## 2022-09-12 DIAGNOSIS — G8929 Other chronic pain: Secondary | ICD-10-CM

## 2022-09-12 DIAGNOSIS — M5030 Other cervical disc degeneration, unspecified cervical region: Secondary | ICD-10-CM | POA: Diagnosis not present

## 2022-09-12 NOTE — Assessment & Plan Note (Signed)
Mild pain but tolerable, no further intervention needed.

## 2022-09-12 NOTE — Assessment & Plan Note (Signed)
This very pleasant 42 year old male returns, he is a Pharmacist, community, he has had several months of pain left anterior shoulder, historically thought to be distal clavicular osteolysis but x-rays negative, really does not have much discomfort over the acromioclavicular joint at this point, the pain has declared itself more as a distal pectoralis major tendon issue, he has tenderness over the tendon and reproduction of pain with hip press. The rest of his shoulder exam is completely normal with negative impingement signs and negative labral signs. Due to over 2 months of symptoms in spite of conservative treatment including physical therapy, analgesics, activity modification with unrevealing x-rays we will proceed with a left shoulder MRI with close attention paid to the distal pectoralis major tendon insertion. He is interested less in treatment as he feels that he can lift through this, he just wants to make sure that he will not be injuring himself or that he does not have an impending rupture.

## 2022-09-12 NOTE — Progress Notes (Signed)
    Procedures performed today:    None.  Independent interpretation of notes and tests performed by another provider:   None.  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 The rest of his shoulder exam is completely normal with negative impingement signs and negative labral signs. Due to over 2 months of symptoms in spite of conservative treatment including physical therapy, analgesics, activity modification with unrevealing x-rays we will proceed with a left shoulder MRI with close attention paid to the distal pectoralis major tendon insertion. He is interested less in treatment as he feels that he can lift through this, he just wants to make sure that he will not be injuring himself or that he does not have an impending rupture.  Bilateral carpal tunnel syndrome EMG and nerve conduction study confirmed bilateral carpal tunnel syndrome, he improved with nighttime splinting but discontinued splinting as he did not feel as though they were comfortable, we had him do home physical therapy, he still has symptoms but feels that he can live with him, he would rather not do an injection or splinting.  DDD (degenerative disc disease), cervical Mild pain but tolerable, no further intervention needed.    ____________________________________________ Ihor Austin. Benjamin Stain, M.D., ABFM., CAQSM., AME. Primary Care and Sports Medicine [facility]  Adjunct Professor of Family Medicine   [place] [employer] [facility] [employer]  Restaurant manager, fast food

## 2022-09-12 NOTE — Assessment & Plan Note (Signed)
EMG and nerve conduction study confirmed bilateral carpal tunnel syndrome, he improved with nighttime splinting but discontinued splinting as he did not feel as though they were comfortable, we had him do home physical therapy, he still has symptoms but feels that he can live with him, he would rather not do an injection or splinting.

## 2022-10-05 ENCOUNTER — Ambulatory Visit: Payer: Managed Care, Other (non HMO)

## 2022-10-05 DIAGNOSIS — M25512 Pain in left shoulder: Secondary | ICD-10-CM | POA: Diagnosis not present

## 2022-10-05 DIAGNOSIS — G8929 Other chronic pain: Secondary | ICD-10-CM

## 2022-10-06 ENCOUNTER — Ambulatory Visit
Admission: EM | Admit: 2022-10-06 | Discharge: 2022-10-06 | Disposition: A | Discharge status: Home / Self Care | Payer: Managed Care, Other (non HMO) | Attending: Urgent Care | Admitting: Urgent Care

## 2022-10-06 DIAGNOSIS — M940 Chondrocostal junction syndrome [Tietze]: Secondary | ICD-10-CM

## 2022-10-06 DIAGNOSIS — U071 COVID-19: Secondary | ICD-10-CM | POA: Diagnosis not present

## 2022-10-06 LAB — POC SARS CORONAVIRUS 2 AG -  ED: SARS Coronavirus 2 Ag: POSITIVE — AB

## 2022-10-06 MED ORDER — HYDROCOD POLI-CHLORPHE POLI ER 10-8 MG/5ML PO SUER: 5.0000 mL | Freq: Two times a day (BID) | ORAL | 0 refills | Status: DC | PRN

## 2022-10-06 MED ORDER — DEXAMETHASONE 6 MG PO TABS: 6.0000 mg | ORAL_TABLET | Freq: Every day | ORAL | 0 refills | Status: DC

## 2022-10-06 NOTE — ED Triage Notes (Signed)
Pt presents to uc with co of uri with cough congestion, sore throat, ha, and chest congestion since Saturday. Pt reports symptoms have been worsening. Pt reports otc musinex cold and cough.

## 2022-10-06 NOTE — Discharge Instructions (Addendum)
You are positive for covid. Start taking dexamethasone, a high potency corticosteroid, once daily with breakfast. This medication should help with your chest discomfort.  Please rest and ensure adequate hydration. Please monitor regression of your symptoms. Some people have tried OTC Quercetin to help fight off illness.  Please use a microwaveable heating pad on your chest. Read the attached handout on costochondritis.  You can use 5mL of the cough syrup up to every 12 hours as needed. Use with caution as it can make you feel drowsy.  If any new or worsening symptoms develops, particularly uncontrollable fever, severe shortness of breath or chest pain, please head to the ER.

## 2022-10-06 NOTE — ED Provider Notes (Signed)
[facility]    CSN: [id number] Arrival date & time: 10/06/22  1434      History   Chief Complaint Chief Complaint  Patient presents with   URI    HPI Alexander Osborne is a 42 y.o. male.   42 year old male presents today due to concerns of cough and nasal congestion.  States Saturday is when his symptoms started, worsening over the past 2 days.  He does have a history of allergies and asthma.  He states this does not feel like an asthma exacerbation as his breathing is not affected.  He does not take anything on a routine basis for asthma and has not required his albuterol inhaler.  Patient reports a lot of nasal congestion and postnasal drainage.  His major concern is extremely forceful coughing.  He states he will get into fits where he cannot stop, causing him to feel lightheaded.  He also reports substernal chest discomfort, reproducible to palpation.  He denies a fever but states he felt hot on Saturday evening. No known sick contact exposures.   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   MDD (major depressive disorder), recurrent severe, without psychosis (HCC) 11/01/2020   Bilateral carpal tunnel syndrome 10/21/2020   Routine screening for STI (sexually transmitted infection) 10/21/2020   History of type 2 diabetes mellitus 10/21/2020   Laryngopharyngeal reflux 10/18/2020   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 Authorizing Provider  chlorpheniramine-HYDROcodone (TUSSIONEX) 10-8 MG/5ML Take 5 mLs by mouth every 12 (twelve) hours as needed for cough. 10/06/22  Yes Tonnya Garbett L, PA  dexamethasone (DECADRON) 6 MG tablet Take 1 tablet (6 mg total) by mouth daily. 10/06/22  Yes Deniya Craigo L, PA  albuterol (VENTOLIN HFA) 108 (90 Base) MCG/ACT inhaler USE 2 INHALATIONS EVERY 6 HOURS AS NEEDED FOR WHEEZING OR SHORTNESS OF BREATH 04/29/22   Everrett Coombe, DO  citalopram (CELEXA) 10 MG tablet TAKE 1 TABLET(10 MG) BY MOUTH DAILY 06/30/22   Thresa Ross, MD  DESCOVY 200-25 MG tablet Take 1 tablet by mouth daily. 07/09/22   Everrett Coombe, DO  EPINEPHrine 0.3 mg/0.3 mL IJ SOAJ injection Inject 0.3 mg into the muscle as needed for anaphylaxis. 03/17/22   Everrett Coombe, DO  Fexofenadine HCl (ALLEGRA PO) Take 1 tablet by mouth daily.    [provider]  lamoTRIgine (LAMICTAL) 25 MG tablet TAKE 3 TABLETS (75 MG TOTAL) BY MOUTH DAILY. 06/30/22   Thresa Ross, MD  meloxicam (MOBIC) 15 MG tablet One tab PO every 24 hours with a meal for 2 weeks, then once every 24 hours prn pain. 07/30/22   Monica Becton, MD  mometasone (NASONEX) 50 MCG/ACT nasal spray Place  2 sprays into the nose daily as needed (for allergies).    [provider]  omeprazole (PRILOSEC) 40 MG capsule Take 1 capsule by mouth daily. 12/04/20   [provider]  Semaglutide,0.25 or 0.5MG /DOS, (OZEMPIC, 0.25 OR 0.5 MG/DOSE,) 2 MG/3ML SOPN Take 0.5 mg by mouth once a week. INJECT 0.5MG  WEEKLY 08/18/22   Everrett Coombe, DO  sildenafil (VIAGRA) 50 MG tablet Take 50 mg by mouth daily as needed for erectile dysfunction.    [provider]  FLUoxetine (PROZAC) 40 MG capsule Take 1 capsule (40 mg total) by mouth daily. 11/02/20 11/26/20  Antonieta Pert, MD   traZODone (DESYREL) 50 MG tablet Take 50-100 mg by mouth at bedtime as needed. 11/21/20 01/23/21  [provider]    Family History Family History  Problem Relation Age of Onset   Cancer Mother 74       pancreatic cancer   Hypertension Father    Heart disease Father 64       CAD   Bipolar disorder Brother    Diabetes Sister    Allergic rhinitis Neg Hx    Angioedema Neg Hx    Asthma Neg Hx    Eczema Neg Hx    Immunodeficiency Neg Hx    Urticaria Neg Hx     Social History Social History   Tobacco Use   Smoking status: Former    Packs/day: 0.25    Years: 11.00    Additional pack years: 0.00    Total pack years: 2.75    Types: Cigarettes    Quit date: 10/03/2017    Years since quitting: 5.0   Smokeless tobacco: Never  Vaping Use   Vaping Use: Some days  Substance Use Topics   Alcohol use: Yes    Alcohol/week: 3.0 - 4.0 standard drinks of alcohol    Types: 2 - 3 Cans of beer, 1 Standard drinks or equivalent per week   Drug use: No     Allergies   Fish-derived products and Shellfish allergy   Review of Systems Review of Systems As per HPI  Physical Exam Triage Vital Signs ED Triage Vitals  Enc Vitals Group     BP 10/06/22 1443 133/83     Pulse Rate 10/06/22 1443 81     Resp 10/06/22 1443 16     Temp 10/06/22 1443 98.2 F (36.8 C)     Temp src --      SpO2 10/06/22 1443 98 %     Weight --      Height --      Head Circumference --      Peak Flow --      Pain Score 10/06/22 1442 5     Pain Loc --      Pain Edu? --      Excl. in GC? --    No data found.  Updated Vital Signs BP 133/83   Pulse 81   Temp 98.2 F (36.8 C)   Resp 16   SpO2 98%   Visual Acuity Right Eye Distance:   Left Eye Distance:   Bilateral Distance:    Right Eye Near:   Left Eye Near:    Bilateral Near:     Physical Exam Vitals and nursing note reviewed.  Constitutional:      General: He is not in acute distress.    Appearance: Normal appearance. He is not  ill-appearing, toxic-appearing or diaphoretic.  HENT:     Head: Normocephalic and atraumatic.  Right Ear: Tympanic membrane, ear canal and external ear normal. No drainage, swelling or tenderness. No middle ear effusion. There is impacted cerumen (not occluded, able to see TM). Tympanic membrane is not erythematous.     Left Ear: Tympanic membrane, ear canal and external ear normal. No drainage, swelling or tenderness.  No middle ear effusion. There is impacted cerumen (not occluded, able to see TM). Tympanic membrane is not erythematous.     Nose: Congestion and rhinorrhea present.     Right Turbinates: Swollen.     Left Turbinates: Swollen.     Right Sinus: No maxillary sinus tenderness or frontal sinus tenderness.     Left Sinus: No maxillary sinus tenderness or frontal sinus tenderness.     Mouth/Throat:     Lips: Pink.     Mouth: Mucous membranes are moist. No oral lesions.     Pharynx: Oropharynx is clear. No pharyngeal swelling, oropharyngeal exudate, posterior oropharyngeal erythema or uvula swelling.     Tonsils: No tonsillar exudate or tonsillar abscesses.  Eyes:     General: No scleral icterus.       Right eye: No discharge.        Left eye: No discharge.     Extraocular Movements: Extraocular movements intact.     Left eye: Normal extraocular motion.     Conjunctiva/sclera: Conjunctivae normal.     Pupils: Pupils are equal, round, and reactive to light.  Neck:     Thyroid: No thyromegaly.  Cardiovascular:     Rate and Rhythm: Normal rate and regular rhythm.     Heart sounds: Normal heart sounds. No murmur heard.    No friction rub. No gallop.  Pulmonary:     Effort: Pulmonary effort is normal. No respiratory distress.     Breath sounds: Normal breath sounds. No stridor. No wheezing, rhonchi or rales.  Chest:     Chest wall: No tenderness.  Abdominal:     Palpations: Abdomen is soft.  Musculoskeletal:     Cervical back: Normal range of motion and neck supple. No  rigidity or tenderness.  Lymphadenopathy:     Cervical: No cervical adenopathy.  Skin:    General: Skin is warm.     Capillary Refill: Capillary refill takes less than 2 seconds.     Coloration: Skin is not jaundiced or pale.     Findings: No bruising, erythema or rash.  Neurological:     General: No focal deficit present.     Mental Status: He is alert and oriented to person, place, and time.  Psychiatric:        Mood and Affect: Mood normal.        Behavior: Behavior normal.      UC Treatments / Results  Labs (all labs ordered are listed, but only abnormal results are displayed) Labs Reviewed  POC SARS CORONAVIRUS 2 AG -  ED - Abnormal; Notable for the following components:      Result Value   SARS Coronavirus 2 Ag Positive (*)    All other components within normal limits    EKG   Radiology No results found.  Procedures Procedures (including critical care time)  Medications Ordered in UC Medications - No data to display  Initial Impression / Assessment and Plan / UC Course  I have reviewed the triage vital signs and the nursing notes.  Pertinent labs & imaging results that were available during my care of the patient were reviewed by me and considered in my  medical decision making (see chart for details).     Covid-19 - discussed positive results with pt. Pt admits he has had covid in the past without short or long term complications. Has never taken the antiviral. Risks vs benefits discussed, ultimately decided supportive tx appropriate. VSS, lungs CTA. Cough syrup recommended to help control his forceful cough.  Costochondritis - handout provided, recommended dexamethasone once daily in combo with moist heat application. ER precautions discussed.   Final Clinical Impressions(s) / UC Diagnoses   Final diagnoses:  COVID-19  Costochondritis     Discharge Instructions      You are positive for covid. Start taking dexamethasone, a high potency  corticosteroid, once daily with breakfast. This medication should help with your chest discomfort.  Please rest and ensure adequate hydration. Please monitor regression of your symptoms. Some people have tried OTC Quercetin to help fight off illness.  Please use a microwaveable heating pad on your chest. Read the attached handout on costochondritis.  You can use 5mL of the cough syrup up to every 12 hours as needed. Use with caution as it can make you feel drowsy.  If any new or worsening symptoms develops, particularly uncontrollable fever, severe shortness of breath or chest pain, please head to the ER.      ED Prescriptions     Medication Sig Dispense Auth. Provider   dexamethasone (DECADRON) 6 MG tablet Take 1 tablet (6 mg total) by mouth daily. 5 tablet Maeven Mcdougall L, PA   chlorpheniramine-HYDROcodone (TUSSIONEX) 10-8 MG/5ML Take 5 mLs by mouth every 12 (twelve) hours as needed for cough. 70 mL Khaleah Duer L, PA      I have reviewed the PDMP during this encounter.   Maretta Bees, [place] 10/06/22 1535

## 2022-10-20 ENCOUNTER — Ambulatory Visit (INDEPENDENT_AMBULATORY_CARE_PROVIDER_SITE_OTHER): Payer: Managed Care, Other (non HMO) | Admitting: Sports Medicine

## 2022-10-20 DIAGNOSIS — G8929 Other chronic pain: Secondary | ICD-10-CM

## 2022-10-20 DIAGNOSIS — M25512 Pain in left shoulder: Secondary | ICD-10-CM | POA: Diagnosis not present

## 2022-10-20 NOTE — Assessment & Plan Note (Signed)
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 I explained to him that this was likely stress fracture, and to avoid provocative motions for approximately 3 months. He can return to see me as needed, if still having pain after the above.

## 2022-10-20 NOTE — Progress Notes (Signed)
    Procedures performed today:    None.  Independent interpretation of notes and tests performed by another provider:   None.  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 I explained to him that this was likely stress fracture, and to avoid provocative motions for approximately 3 months. He can return to see me as needed, if still having pain after the above.  I spent 30 minutes of total time managing this patient today, this includes chart review, face to face, and non-face to face time.  We discussed the anatomy and imaging extensively.  ____________________________________________ Ihor Austin. Benjamin Stain, M.D., ABFM., CAQSM., AME. Primary Care and Sports Medicine [facility]  Adjunct Professor of Family Medicine  [place] [employer] [facility] [employer]  Restaurant manager, fast food

## 2022-10-24 ENCOUNTER — Other Ambulatory Visit (HOSPITAL_COMMUNITY): Payer: Self-pay | Admitting: Psychiatry

## 2022-11-03 ENCOUNTER — Encounter (HOSPITAL_COMMUNITY): Payer: Self-pay | Admitting: Psychiatry

## 2022-11-03 ENCOUNTER — Telehealth (HOSPITAL_COMMUNITY): Payer: 59 | Admitting: Psychiatry

## 2022-11-03 DIAGNOSIS — F39 Unspecified mood [affective] disorder: Secondary | ICD-10-CM

## 2022-11-03 DIAGNOSIS — F331 Major depressive disorder, recurrent, moderate: Secondary | ICD-10-CM | POA: Diagnosis not present

## 2022-11-03 DIAGNOSIS — F411 Generalized anxiety disorder: Secondary | ICD-10-CM

## 2022-11-03 DIAGNOSIS — Z87828 Personal history of other (healed) physical injury and trauma: Secondary | ICD-10-CM

## 2022-11-03 MED ORDER — CITALOPRAM HYDROBROMIDE 10 MG PO TABS: ORAL_TABLET | ORAL | 0 refills | Status: DC

## 2022-11-03 MED ORDER — LAMOTRIGINE 25 MG PO TABS: ORAL_TABLET | ORAL | 2 refills | Status: DC

## 2022-11-03 NOTE — Progress Notes (Signed)
[facility] Follow Up Visit  Patient Identification: Alexander Osborne MRN:  [id number] Date of Evaluation:  11/03/2022 Referral Source: hospital discharge Chief Complaint:  follow up  depression, mood symptoms Visit Diagnosis:    ICD-10-CM   1. Unspecified mood (affective) disorder (HCC)  F39 citalopram (CELEXA) 10 MG tablet    2. GAD (generalized anxiety disorder)  F41.1     3. MDD (major depressive disorder), recurrent episode, moderate (HCC)  F33.1     4. History of trauma  Z87.828        Virtual Visit via Video Note  I connected with Alexander Osborne on 11/03/22 at  4:00 PM EDT by a video enabled telemedicine application and verified that I am speaking with the correct person using two identifiers.  Location: Patient: work Provider: home office   I discussed the limitations of evaluation and management by telemedicine and the availability of in person appointments. The patient expressed understanding and agreed to proceed.  History of Present Illness:    Observations/Objective:   Assessment and Plan:   Follow Up Instructions:    I discussed the assessment and treatment plan with the patient. The patient was provided an opportunity to ask questions and all were answered. The patient agreed with the plan and demonstrated an understanding of the instructions.   The patient was advised to call back or seek an in-person evaluation if the symptoms worsen or if the condition fails to improve as anticipated.  I provided 20 minutes of non-face-to-face time during this encounter.   History of Present Illness: Patient is a 42  years old currently single male referred by hospital discharge with OD admission for one day in past leading to referral  He works full-time at [employer]  Has been doing fair, handling stress and managing it  Some up or low days but can manage them  Tolerating meds, no rash on lamictal   Working on self growth    Aggravating  factors; past relationships  Modifying factors : working on self growth  Severity fair    Past Psychiatric History: depression  Previous Psychotropic Medications: Yes   Substance Abuse History in the last 12 months:  Yes.    Consequences of Substance Abuse: Alcohol use not regular, but discussed its effect on depression  Past Medical History:  Past Medical History:  Diagnosis Date   Allergy    Anxiety    Asthma    Depression    Diabetes mellitus without complication (HCC)     Past Surgical History:  Procedure Laterality Date   corneal transplant left     EYE SURGERY      Family Psychiatric History: brother : bipolar, sister, depression  Family History:  Family History  Problem Relation Age of Onset   Cancer Mother 20       pancreatic cancer   Hypertension Father    Heart disease Father 38       CAD   Bipolar disorder Brother    Diabetes Sister    Allergic rhinitis Neg Hx    Angioedema Neg Hx    Asthma Neg Hx    Eczema Neg Hx    Immunodeficiency Neg Hx    Urticaria Neg Hx     Social History:   Social History   Socioeconomic History   Marital status: Legally Separated    Spouse name: Not on file   Number of children: 0   Years of education: Not on file   Highest education level:  Associate degree: academic program  Occupational History   Occupation: Event organiser: Radio producer  Tobacco Use   Smoking status: Former    Current packs/day: 0.00    Average packs/day: 0.3 packs/day for 11.0 years (2.8 ttl pk-yrs)    Types: Cigarettes    Start date: 10/04/2006    Quit date: 10/03/2017    Years since quitting: 5.0   Smokeless tobacco: Never  Vaping Use   Vaping status: Some Days  Substance and Sexual Activity   Alcohol use: Yes    Alcohol/week: 3.0 - 4.0 standard drinks of alcohol    Types: 2 - 3 Cans of beer, 1 Standard drinks or equivalent per week   Drug use: No   Sexual activity: Yes    Partners: Male    Birth control/protection: None     Comment: SS partner  Other Topics Concern   Not on file  Social History Narrative   Marital status: married x 4 years; happily married.       Lives; with husbnad      Employment: Building control surveyor at [employer]      Tobacco: socially; weekends      Alcohol: 1-3 drinks per week      Drugs: none      Exercise: none   Denies caffeine use    Social Determinants of Health   Financial Resource Strain: Low Risk  (09/12/2022)   Overall Financial Resource Strain (CARDIA)    Difficulty of Paying Living Expenses: Not very hard  Food Insecurity: No Food Insecurity (09/12/2022)   Hunger Vital Sign    Worried About Running Out of Food in the Last Year: Never true    Ran Out of Food in the Last Year: Never true  Transportation Needs: No Transportation Needs (09/12/2022)   PRAPARE - Administrator, Civil Service (Medical): No    Lack of Transportation (Non-Medical): No  Physical Activity: Sufficiently Active (09/12/2022)   Exercise Vital Sign    Days of Exercise per Week: 5 days    Minutes of Exercise per Session: 60 min  Stress: No Stress Concern Present (09/12/2022)   [employer] of Occupational Health - Occupational Stress Questionnaire    Feeling of Stress : Not at all  Social Connections: Socially Isolated (09/12/2022)   Social Connection and Isolation Panel [NHANES]    Frequency of Communication with Friends and Family: Once a week    Frequency of Social Gatherings with Friends and Family: Once a week    Attends Religious Services: Never    Database administrator or Organizations: No    Attends Engineer, structural: Not on file    Marital Status: Separated     Allergies:   Allergies  Allergen Reactions   Fish-Derived Products Other (See Comments)    Allergic, but reaction not recalled Confirmed thru testing   Shellfish Allergy Other (See Comments)    Patient does not recall the reaction Confirmed thru testing    Metabolic Disorder Labs: Lab  Results  Component Value Date   HGBA1C 6.1 (H) 07/09/2022   MPG 128 07/09/2022   MPG 143 05/23/2021   No results found for: "PROLACTIN" Lab Results  Component Value Date   CHOL 174 07/09/2022   TRIG 116 07/09/2022   HDL 42 07/09/2022   CHOLHDL 4.1 07/09/2022   VLDL 25 11/02/2020   LDLCALC 110 (H) 07/09/2022   LDLCALC 125 (H) 05/23/2021   Lab Results  Component Value Date  TSH 1.06 05/23/2021    Therapeutic Level Labs: No results found for: "LITHIUM" No results found for: "CBMZ" No results found for: "VALPROATE"  Current Medications: Current Outpatient Medications  Medication Sig Dispense Refill   albuterol (VENTOLIN HFA) 108 (90 Base) MCG/ACT inhaler USE 2 INHALATIONS EVERY 6 HOURS AS NEEDED FOR WHEEZING OR SHORTNESS OF BREATH 8.5 g 10   chlorpheniramine-HYDROcodone (TUSSIONEX) 10-8 MG/5ML Take 5 mLs by mouth every 12 (twelve) hours as needed for cough. 70 mL 0   citalopram (CELEXA) 10 MG tablet TAKE 1 TABLET(10 MG) BY MOUTH DAILY 90 tablet 0   DESCOVY 200-25 MG tablet Take 1 tablet by mouth daily. 90 tablet 0   dexamethasone (DECADRON) 6 MG tablet Take 1 tablet (6 mg total) by mouth daily. 5 tablet 0   EPINEPHrine 0.3 mg/0.3 mL IJ SOAJ injection Inject 0.3 mg into the muscle as needed for anaphylaxis. 1 each 1   Fexofenadine HCl (ALLEGRA PO) Take 1 tablet by mouth daily.     lamoTRIgine (LAMICTAL) 25 MG tablet TAKE 3 TABLETS BY MOUTH DAILY 90 tablet 2   meloxicam (MOBIC) 15 MG tablet One tab PO every 24 hours with a meal for 2 weeks, then once every 24 hours prn pain. 30 tablet 3   mometasone (NASONEX) 50 MCG/ACT nasal spray Place 2 sprays into the nose daily as needed (for allergies).     omeprazole (PRILOSEC) 40 MG capsule Take 1 capsule by mouth daily.     Semaglutide,0.25 or 0.5MG /DOS, (OZEMPIC, 0.25 OR 0.5 MG/DOSE,) 2 MG/3ML SOPN Take 0.5 mg by mouth once a week. INJECT 0.5MG  WEEKLY 9 mL 1   sildenafil (VIAGRA) 50 MG tablet Take 50 mg by mouth daily as needed for  erectile dysfunction.     No current facility-administered medications for this visit.     Psychiatric Specialty Exam: Review of Systems  Cardiovascular:  Negative for chest pain.  Skin:  Negative for rash.  Psychiatric/Behavioral:  Negative for agitation, decreased concentration and self-injury.     There were no vitals taken for this visit.There is no height or weight on file to calculate BMI.  General Appearance: Casual  Eye Contact:  Good  Speech:  Clear and Coherent  Volume:  Normal  Mood: fair  Affect:  Congruent  Thought Process:  Goal Directed  Orientation:  Full (Time, Place, and Person)  Thought Content:  Rumination  Suicidal Thoughts:  No  Homicidal Thoughts:  No  Memory:  Immediate;   Fair  Judgement:  Fair  Insight:  Fair  Psychomotor Activity:  Normal  Concentration:  Concentration: Fair  Recall:  Good  Fund of Knowledge:Good  Language: Good  Akathisia:  No  Handed:   AIMS (if indicated):  not done  Assets:  Desire for Improvement Physical Health  ADL's:  Intact  Cognition: WNL  Sleep:  Fair   Screenings: AIMS    Flowsheet Row [facility]  AIMS Total Score 0      AUDIT    Flowsheet Row Office Visit from 09/12/2022 in [facility] Primary Care & Sports Medicine at [facility] Ad[facility] ([facility]  Alcohol Use Disorder Identification Test Final Score (AUDIT) 3 3      GAD-7    Flowsheet Row Office Visit from 07/09/2022 in [facility] Primary Care & Sports Medicine at [facility] Office Visit from 12/13/2021 in [facility] Primary Care & Sports Medicine at  [facility] Office Visit from 03/04/2021 in [facility] Primary Care & Sports Medicine at [facility] Office Visit from 10/18/2020 in [facility] Primary Care & Sports Medicine at [facility]  Total GAD-7  Score 0 4 6 7       PHQ2-9    Flowsheet Row Office Visit from 07/09/2022 in [facility] Primary Care & Sports Medicine at [facility] Office Visit from 03/17/2022 in [facility] Primary Care & Sports Medicine at [facility] Office Visit from 12/13/2021 in [facility] Primary Care & Sports Medicine at [facility] Office Visit from 09/10/2021 in [facility] Primary Care & Sports Medicine at [facility] Office Visit from 03/04/2021 in [facility] Primary Care & Sports Medicine at [facility] Total Score 2 0 2 0 4  PHQ-9 Total Score 6 0 8 -- 9      Flowsheet Row ED from 10/06/2022 in [facility] at [facility] ED from 08/16/2022 in [facility] at [facility] ED from 01/24/2022 in [facility] at [facility])  C-SSRS RISK CATEGORY No Risk No Risk No Risk       Assessment and Plan: as follows  Prior documentation reviewed   Major depressive disorder recurrent moderate to severe: manageable continue lamictal, celexa    Generalized anxiety disorder  doing fair, working on distraction and self growth helping as well, continue celexa  Refills due were sent Fu 3 - 45m.  Thresa Ross, MD 7/15/20244:10 PM

## 2022-11-08 ENCOUNTER — Other Ambulatory Visit: Payer: Self-pay | Admitting: Family Medicine

## 2022-12-03 ENCOUNTER — Encounter: Payer: Self-pay | Admitting: Family Medicine

## 2022-12-04 ENCOUNTER — Other Ambulatory Visit: Payer: Self-pay

## 2022-12-04 MED ORDER — ALBUTEROL SULFATE HFA 108 (90 BASE) MCG/ACT IN AERS: 2.0000 | INHALATION_SPRAY | Freq: Four times a day (QID) | RESPIRATORY_TRACT | 2 refills | Status: DC | PRN

## 2023-01-08 ENCOUNTER — Ambulatory Visit (INDEPENDENT_AMBULATORY_CARE_PROVIDER_SITE_OTHER): Payer: Managed Care, Other (non HMO) | Admitting: Sports Medicine

## 2023-01-08 ENCOUNTER — Encounter: Payer: Self-pay | Admitting: Sports Medicine

## 2023-01-08 ENCOUNTER — Ambulatory Visit: Payer: Managed Care, Other (non HMO)

## 2023-01-08 ENCOUNTER — Other Ambulatory Visit (INDEPENDENT_AMBULATORY_CARE_PROVIDER_SITE_OTHER): Payer: Managed Care, Other (non HMO)

## 2023-01-08 DIAGNOSIS — M7041 Prepatellar bursitis, right knee: Secondary | ICD-10-CM

## 2023-01-08 DIAGNOSIS — M71161 Other infective bursitis, right knee: Secondary | ICD-10-CM | POA: Insufficient documentation

## 2023-01-08 MED ORDER — DOXYCYCLINE HYCLATE 100 MG PO TABS
100.0000 mg | ORAL_TABLET | Freq: Two times a day (BID) | ORAL | 0 refills | Status: AC
Start: 2023-01-08 — End: 2023-01-15

## 2023-01-08 MED ORDER — PREDNISONE 50 MG PO TABS
ORAL_TABLET | ORAL | 0 refills | Status: AC
Start: 2023-01-08 — End: ?

## 2023-01-08 NOTE — Assessment & Plan Note (Addendum)
Pleasant 42 year old male, he has had several days of increasing swelling and pain at right knee anterior, he had a fall a few weeks prior leading to an abrasion anterior knee. On exam he has swelling anterior knee above the kneecap with palpable fluctuance, it is warm, erythematous. We did an aspiration of about 10 mL of cloudy fluid. Due to the concern for crystalline bursitis versus septic bursitis we will hold off on injection, we will do a short course of prednisone, doxycycline, crystal analysis and cultures of the aspirated fluid, return to see me in 1 to 2 weeks.  Update: Cultures have grown out Staphylococcus aureus sensitive to doxycycline, confirming the diagnosis of septic bursitis.

## 2023-01-08 NOTE — Progress Notes (Addendum)
    Procedures performed today:    Procedure: Real-time Ultrasound Guided aspiration of right prepatellar bursa Device: Samsung [id number]  Verbal informed consent obtained.  Time-out conducted.  Noted no overlying erythema, induration, or other signs of local infection.  Skin prepped in a sterile fashion.  Local anesthesia: Topical Ethyl chloride.  With sterile technique and under real time ultrasound guidance: Noted bursitis, aspirated 10 mL of serosanguineous cloudy fluid. Completed without difficulty  Advised to call if fevers/chills, erythema, induration, drainage, or persistent bleeding.  Images permanently stored and available for review in PACS.  Impression: Technically successful ultrasound guided aspiration.  Independent interpretation of notes and tests performed by another provider:   None.  Brief History, Exam, Impression, and Recommendations:    Septic prepatellar bursitis of right knee Pleasant 42 year old male, he has had several days of increasing swelling and pain at right knee anterior, he had a fall a few weeks prior leading to an abrasion anterior knee. On exam he has swelling anterior knee above the kneecap with palpable fluctuance, it is warm, erythematous. We did an aspiration of about 10 mL of cloudy fluid. Due to the concern for crystalline bursitis versus septic bursitis we will hold off on injection, we will do a short course of prednisone, doxycycline, crystal analysis and cultures of the aspirated fluid, return to see me in 1 to 2 weeks.  Update: Cultures have grown out Staphylococcus aureus sensitive to doxycycline, confirming the diagnosis of septic bursitis.    ____________________________________________ Alexander Osborne. Benjamin Stain, M.D., ABFM., CAQSM., AME. Primary Care and Sports Medicine [place] [facility]  Adjunct Professor of Family Medicine  [employer] [facility] [employer]  Restaurant manager, fast food

## 2023-01-12 ENCOUNTER — Telehealth (HOSPITAL_COMMUNITY): Payer: Self-pay | Admitting: *Deleted

## 2023-01-12 NOTE — Telephone Encounter (Signed)
LVM checking which Rx is  in use now

## 2023-01-15 ENCOUNTER — Ambulatory Visit: Payer: Managed Care, Other (non HMO) | Admitting: Sports Medicine

## 2023-01-16 ENCOUNTER — Other Ambulatory Visit: Payer: Self-pay

## 2023-01-16 ENCOUNTER — Ambulatory Visit: Payer: Managed Care, Other (non HMO) | Admitting: Family Medicine

## 2023-01-16 DIAGNOSIS — Z79899 Other long term (current) drug therapy: Secondary | ICD-10-CM

## 2023-01-20 ENCOUNTER — Encounter: Payer: Self-pay | Admitting: Sports Medicine

## 2023-01-20 ENCOUNTER — Ambulatory Visit (INDEPENDENT_AMBULATORY_CARE_PROVIDER_SITE_OTHER): Payer: Managed Care, Other (non HMO) | Admitting: Sports Medicine

## 2023-01-20 DIAGNOSIS — M71161 Other infective bursitis, right knee: Secondary | ICD-10-CM

## 2023-01-20 MED ORDER — DOXYCYCLINE HYCLATE 100 MG PO TABS
100.0000 mg | ORAL_TABLET | Freq: Two times a day (BID) | ORAL | 0 refills | Status: AC
Start: 2023-01-20 — End: 2023-01-30

## 2023-01-20 NOTE — Assessment & Plan Note (Signed)
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 At the last visit about a week ago I aspirated approximately 10 mL of cloudy fluid, we sent this off for crystal analysis and cultures, crystals were negative, it did grew out Staphylococcus aureus pansensitive. He has improved considerably with 7 days of doxycycline, prednisone. He still has some induration, but only minimal swelling and no fluctuance so we will add an additional 10 days of doxycycline, he will do warm compresses and keep the small abrasion covered. Knee has full motion, no effusion, no evidence of septic joint. Return to see me as needed.

## 2023-01-20 NOTE — Progress Notes (Signed)
    Procedures performed today:    None.  Independent interpretation of notes and tests performed by another provider:   None.  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 At the last visit about a week ago I aspirated approximately 10 mL of cloudy fluid, we sent this off for crystal analysis and cultures, crystals were negative, it did grew out Staphylococcus aureus pansensitive. He has improved considerably with 7 days of doxycycline, prednisone. He still has some induration, but only minimal swelling and no fluctuance so we will add an additional 10 days of doxycycline, he will do warm compresses and keep the small abrasion covered. Knee has full motion, no effusion, no evidence of septic joint. Return to see me as needed.    ____________________________________________ Ihor Austin. Benjamin Stain, M.D., ABFM., CAQSM., AME. Primary Care and Sports Medicine [facility]  Adjunct Professor of Family Medicine  [place] [employer] [facility] [employer]  Restaurant manager, fast food

## 2023-01-26 LAB — BODY FLUID CULTURE AER/ANAER

## 2023-01-26 LAB — SYNOVIAL FLUID PANEL
Eos, Fluid: 0 %
Glucose, Fluid: 81 mg/dL
Lining Cells, Synovial: 0 %
Lymphs, Fluid: 8 %
Macrophages Fld: 0 %
Nuc cell # Fld: 8320 {cells}/uL — ABNORMAL HIGH (ref 0–200)
Polys, Fluid: 92 %
Protein, Fluid: 5.1 g/dL
RBC, Fluid: 21000 /uL

## 2023-01-26 LAB — RESULT

## 2023-01-26 LAB — SPECIMEN STATUS REPORT

## 2023-01-27 ENCOUNTER — Ambulatory Visit: Payer: Managed Care, Other (non HMO) | Admitting: Family Medicine

## 2023-02-02 ENCOUNTER — Telehealth (HOSPITAL_COMMUNITY): Payer: Self-pay | Admitting: Psychiatry

## 2023-02-02 ENCOUNTER — Encounter (HOSPITAL_COMMUNITY): Payer: Self-pay

## 2023-02-06 ENCOUNTER — Ambulatory Visit (INDEPENDENT_AMBULATORY_CARE_PROVIDER_SITE_OTHER): Payer: Managed Care, Other (non HMO) | Admitting: Family Medicine

## 2023-02-06 ENCOUNTER — Encounter: Payer: Self-pay | Admitting: Family Medicine

## 2023-02-06 VITALS — BP 124/76 | HR 77 | Ht 70.0 in | Wt 224.0 lb

## 2023-02-06 DIAGNOSIS — Z79899 Other long term (current) drug therapy: Secondary | ICD-10-CM

## 2023-02-06 DIAGNOSIS — J4540 Moderate persistent asthma, uncomplicated: Secondary | ICD-10-CM

## 2023-02-06 DIAGNOSIS — Z8639 Personal history of other endocrine, nutritional and metabolic disease: Secondary | ICD-10-CM | POA: Diagnosis not present

## 2023-02-06 DIAGNOSIS — Z23 Encounter for immunization: Secondary | ICD-10-CM | POA: Diagnosis not present

## 2023-02-06 DIAGNOSIS — F332 Major depressive disorder, recurrent severe without psychotic features: Secondary | ICD-10-CM

## 2023-02-06 LAB — POCT GLYCOSYLATED HEMOGLOBIN (HGB A1C): Hemoglobin A1C: 6.3 % — AB (ref 4.0–5.6)

## 2023-02-06 MED ORDER — SEMAGLUTIDE (1 MG/DOSE) 4 MG/3ML ~~LOC~~ SOPN: 1.0000 mg | PEN_INJECTOR | SUBCUTANEOUS | 1 refills | Status: AC

## 2023-02-06 MED ORDER — DESCOVY 200-25 MG PO TABS: 1.0000 | ORAL_TABLET | Freq: Every day | ORAL | 0 refills | Status: DC

## 2023-02-06 MED ORDER — ALBUTEROL SULFATE HFA 108 (90 BASE) MCG/ACT IN AERS: 2.0000 | INHALATION_SPRAY | Freq: Four times a day (QID) | RESPIRATORY_TRACT | 2 refills | Status: AC | PRN

## 2023-02-06 NOTE — Patient Instructions (Signed)
Increase ozempic to 1mg /week.  Repeat HIV testing in 3 months.-Just a lab visit.  See me again in 6 months.

## 2023-02-06 NOTE — Assessment & Plan Note (Signed)
Management per psychiatry.  Stable at this time. ?

## 2023-02-06 NOTE — Progress Notes (Signed)
Alexander Osborne [place] - 42 y.o. male MRN [id number]  Date of birth: 1981/01/04  Subjective No chief complaint on file.   HPI Alexander Osborne is a 42 y.o. male here today for follow up visit.   He reports that he is doing pretty well.  He had septic prepatellar bursitis that was treated by Dr. Benjamin Stain recently.  Doing better from this.    History of diabetes that is managed with Ozempic.  Currently at 0.5mg /week.  He is doing well with current medications.  He does continue to exercise frequently.    He is seeing psychiatry for management of medications for mood.  Currently treated with celexa and lamictal.  Stable at this time.   He has taken Descovy for PrEP.  He reports that he has been out of this for a few months. .    ROS:  A comprehensive ROS was completed and negative except as noted per HPI  Allergies  Allergen Reactions   Fish-Derived Products Other (See Comments)    Allergic, but reaction not recalled Confirmed thru testing   Shellfish Allergy Other (See Comments)    Patient does not recall the reaction Confirmed thru testing    Past Medical History:  Diagnosis Date   Allergy    Anxiety    Asthma    Depression    Diabetes mellitus without complication (HCC)     Past Surgical History:  Procedure Laterality Date   corneal transplant left     EYE SURGERY      Social History   Socioeconomic History   Marital status: Legally Separated    Spouse name: Not on file   Number of children: 0   Years of education: Not on file   Highest education level: Associate degree: academic program  Occupational History   Occupation: Event organiser: Radio producer  Tobacco Use   Smoking status: Former    Current packs/day: 0.00    Average packs/day: 0.3 packs/day for 11.0 years (2.8 ttl pk-yrs)    Types: Cigarettes    Start date: 10/04/2006    Quit date: 10/03/2017    Years since quitting: 5.3   Smokeless tobacco: Never  Vaping Use   Vaping status: Some  Days  Substance and Sexual Activity   Alcohol use: Yes    Alcohol/week: 3.0 - 4.0 standard drinks of alcohol    Types: 2 - 3 Cans of beer, 1 Standard drinks or equivalent per week   Drug use: No   Sexual activity: Yes    Partners: Male    Birth control/protection: None    Comment: SS partner  Other Topics Concern   Not on file  Social History Narrative   Marital status: married x 4 years; happily married.       Lives; with husbnad      Employment: Building control surveyor at [employer]      Tobacco: socially; weekends      Alcohol: 1-3 drinks per week      Drugs: none      Exercise: none   Denies caffeine use    Social Determinants of Health   Financial Resource Strain: Low Risk  (09/12/2022)   Overall Financial Resource Strain (CARDIA)    Difficulty of Paying Living Expenses: Not very hard  Food Insecurity: No Food Insecurity (09/12/2022)   Hunger Vital Sign    Worried About Running Out of Food in the Last Year: Never true    Ran Out of Food in the Last  Year: Never true  Transportation Needs: No Transportation Needs (09/12/2022)   PRAPARE - Administrator, Civil Service (Medical): No    Lack of Transportation (Non-Medical): No  Physical Activity: Sufficiently Active (09/12/2022)   Exercise Vital Sign    Days of Exercise per Week: 5 days    Minutes of Exercise per Session: 60 min  Stress: No Stress Concern Present (09/12/2022)   [employer] of Occupational Health - Occupational Stress Questionnaire    Feeling of Stress : Not at all  Social Connections: Socially Isolated (09/12/2022)   Social Connection and Isolation Panel [NHANES]    Frequency of Communication with Friends and Family: Once a week    Frequency of Social Gatherings with Friends and Family: Once a week    Attends Religious Services: Never    Database administrator or Organizations: No    Attends Engineer, structural: Not on file    Marital Status: Separated    Family History   Problem Relation Age of Onset   Cancer Mother 61       pancreatic cancer   Hypertension Father    Heart disease Father 65       CAD   Bipolar disorder Brother    Diabetes Sister    Allergic rhinitis Neg Hx    Angioedema Neg Hx    Asthma Neg Hx    Eczema Neg Hx    Immunodeficiency Neg Hx    Urticaria Neg Hx     Health Maintenance  Topic Date Due   Hepatitis C Screening  Never done   Diabetic kidney evaluation - Urine ACR  12/14/2022   OPHTHALMOLOGY EXAM  02/19/2023 (Originally 01/06/2020)   COVID-19 Vaccine (5 - 2023-24 season) 02/22/2024 (Originally 12/21/2022)   Diabetic kidney evaluation - eGFR measurement  07/09/2023   HEMOGLOBIN A1C  08/07/2023   FOOT EXAM  02/06/2024   DTaP/Tdap/Td (2 - Td or Tdap) 04/30/2027   INFLUENZA VACCINE  Completed   HIV Screening  Completed   HPV VACCINES  Aged Out     ----------------------------------------------------------------------------------------------------------------------------------------------------------------------------------------------------------------- Physical Exam BP 124/76   Pulse 77   Ht 5\' 10"  (1.778 m)   Wt 224 lb (101.6 kg)   SpO2 99%   BMI 32.14 kg/m   Physical Exam Constitutional:      Appearance: Normal appearance.  HENT:     Head: Normocephalic and atraumatic.  Eyes:     General: No scleral icterus. Cardiovascular:     Rate and Rhythm: Normal rate and regular rhythm.  Pulmonary:     Effort: Pulmonary effort is normal.     Breath sounds: Normal breath sounds.  Musculoskeletal:     Cervical back: Neck supple.  Neurological:     Mental Status: He is alert.  Psychiatric:        Mood and Affect: Mood normal.        Behavior: Behavior normal.     ------------------------------------------------------------------------------------------------------------------------------------------------------------------------------------------------------------------- Assessment and Plan  History of type 2  diabetes mellitus Continues on Ozempic.  Tolerating well.  A1c is at 6.3%.  Increase ozempic to 1mg [place]  MDD (major depressive disorder), recurrent severe, without psychosis (HCC) Management per psychiatry.  Stable at this time.  Moderate persistent asthma Stable with current inhalers.  Recommend continuation.   On pre-exposure prophylaxis for HIV Has been off of Descovy for a couple of months. Marland Kitchen  Updating renal function and HIV antibody.  He is aware that he needs to have HIV antibody rechecked again in 3  months.   Meds ordered this encounter  Medications   Semaglutide, 1 MG/DOSE, 4 MG/3ML SOPN    Sig: Inject 1 mg as directed once a week.    Dispense:  9 mL    Refill:  1   DESCOVY 200-25 MG tablet    Sig: Take 1 tablet by mouth daily.    Dispense:  90 tablet    Refill:  0   albuterol (VENTOLIN HFA) 108 (90 Base) MCG/ACT inhaler    Sig: Inhale 2 puffs into the lungs every 6 (six) hours as needed for wheezing or shortness of breath. May substitute for preferred albuterol MDI.    Dispense:  6.7 g    Refill:  2    Return in about 6 months (around 08/07/2023) for Type 2 Diabetes.    This visit occurred during the SARS-CoV-2 public health emergency.  Safety protocols were in place, including screening questions prior to the visit, additional usage of staff PPE, and extensive cleaning of exam room while observing appropriate contact time as indicated for disinfecting solutions.

## 2023-02-06 NOTE — Assessment & Plan Note (Signed)
Stable with current inhalers.  Recommend continuation. °

## 2023-02-06 NOTE — Assessment & Plan Note (Signed)
Has been off of Descovy for a couple of months. Alexander Osborne  Updating renal function and HIV antibody.  He is aware that he needs to have HIV antibody rechecked again in 3 months.

## 2023-02-06 NOTE — Assessment & Plan Note (Signed)
Continues on Ozempic.  Tolerating well.  A1c is at 6.3%.  Increase ozempic to 1mg Alexander Osborne

## 2023-02-07 LAB — BASIC METABOLIC PANEL
BUN/Creatinine Ratio: 17 (ref 9–20)
BUN: 17 mg/dL (ref 6–24)
CO2: 22 mmol/L (ref 20–29)
Calcium: 9.9 mg/dL (ref 8.7–10.2)
Chloride: 101 mmol/L (ref 96–106)
Creatinine, Ser: 1.03 mg/dL (ref 0.76–1.27)
Glucose: 120 mg/dL — ABNORMAL HIGH (ref 70–99)
Potassium: 4.4 mmol/L (ref 3.5–5.2)
Sodium: 138 mmol/L (ref 134–144)
eGFR: 94 mL/min/{1.73_m2} (ref >59)

## 2023-02-07 LAB — HIV ANTIBODY (ROUTINE TESTING W REFLEX): HIV Screen 4th Generation wRfx: NONREACTIVE

## 2023-02-07 LAB — MICROALBUMIN / CREATININE URINE RATIO
Creatinine, Urine: 240.9 mg/dL
Microalb/Creat Ratio: 3 mg/g{creat} (ref 0–29)
Microalbumin, Urine: 8.2 ug/mL

## 2023-02-09 ENCOUNTER — Other Ambulatory Visit: Payer: Self-pay | Admitting: Family Medicine

## 2023-02-09 DIAGNOSIS — Z8639 Personal history of other endocrine, nutritional and metabolic disease: Secondary | ICD-10-CM

## 2023-02-13 ENCOUNTER — Telehealth (INDEPENDENT_AMBULATORY_CARE_PROVIDER_SITE_OTHER): Payer: 59 | Admitting: Psychiatry

## 2023-02-13 ENCOUNTER — Encounter (HOSPITAL_COMMUNITY): Payer: Self-pay | Admitting: Psychiatry

## 2023-02-13 DIAGNOSIS — F39 Unspecified mood [affective] disorder: Secondary | ICD-10-CM

## 2023-02-13 DIAGNOSIS — F332 Major depressive disorder, recurrent severe without psychotic features: Secondary | ICD-10-CM

## 2023-02-13 DIAGNOSIS — F411 Generalized anxiety disorder: Secondary | ICD-10-CM | POA: Diagnosis not present

## 2023-02-13 DIAGNOSIS — Z87828 Personal history of other (healed) physical injury and trauma: Secondary | ICD-10-CM

## 2023-02-13 DIAGNOSIS — F331 Major depressive disorder, recurrent, moderate: Secondary | ICD-10-CM

## 2023-02-13 NOTE — Progress Notes (Signed)
[facility] Follow Up Visit  Patient Identification: Alexander Osborne MRN:  [id number] Date of Evaluation:  02/13/2023 Referral Source: hospital discharge Chief Complaint:  follow up  depression, mood symptoms Visit Diagnosis:    ICD-10-CM   1. Unspecified mood (affective) disorder (HCC)  F39     2. GAD (generalized anxiety disorder)  F41.1     3. MDD (major depressive disorder), recurrent episode, moderate (HCC)  F33.1     4. History of trauma  Z87.828       Virtual Visit via Video Note  I connected with Alexander Osborne on 02/13/23 at 11:30 AM EDT by a video enabled telemedicine application and verified that I am speaking with the correct person using two identifiers.  Location: Patient: parked car Provider: home office   I discussed the limitations of evaluation and management by telemedicine and the availability of in person appointments. The patient expressed understanding and agreed to proceed.      I discussed the assessment and treatment plan with the patient. The patient was provided an opportunity to ask questions and all were answered. The patient agreed with the plan and demonstrated an understanding of the instructions.   The patient was advised to call back or seek an in-person evaluation if the symptoms worsen or if the condition fails to improve as anticipated.  I provided 20 minutes of non-face-to-face time during this encounter.     History of Present Illness: Patient is a 42 years old currently single male referred by hospital discharge with OD admission for one day in past leading to referral  He works full-time at [employer]  Has been doing well, less depressed more positive, went to [place] visit parents Communicating better with them   Working on self growth    Aggravating factors; past relationships  Modifying factors :support system  Severity improved.  No rash or chest pain    Past Psychiatric History:  depression  Previous Psychotropic Medications: Yes   Substance Abuse History in the last 12 months:  Yes.    Consequences of Substance Abuse: Alcohol use not regular, but discussed its effect on depression  Past Medical History:  Past Medical History:  Diagnosis Date  . Allergy   . Anxiety   . Asthma   . Depression   . Diabetes mellitus without complication [facility])     Past Surgical History:  Procedure Laterality Date  . corneal transplant left    . EYE SURGERY      Family Psychiatric History: brother : bipolar, sister, depression  Family History:  Family History  Problem Relation Age of Onset  . Cancer Mother 22       pancreatic cancer  . Hypertension Father   . Heart disease Father 71       CAD  . Bipolar disorder Brother   . Diabetes Sister   . Allergic rhinitis Neg Hx   . Angioedema Neg Hx   . Asthma Neg Hx   . Eczema Neg Hx   . Immunodeficiency Neg Hx   . Urticaria Neg Hx     Social History:   Social History   Socioeconomic History  . Marital status: Legally Separated    Spouse name: Not on file  . Number of children: 0  . Years of education: Not on file  . Highest education level: Associate degree: academic program  Occupational History  . Occupation: Event organiser: Radio producer  Tobacco Use  . Smoking status: Former  Current packs/day: 0.00    Average packs/day: 0.3 packs/day for 11.0 years (2.8 ttl pk-yrs)    Types: Cigarettes    Start date: 10/04/2006    Quit date: 10/03/2017    Years since quitting: 5.3  . Smokeless tobacco: Never  Vaping Use  . Vaping status: Some Days  Substance and Sexual Activity  . Alcohol use: Yes    Alcohol/week: 3.0 - 4.0 standard drinks of alcohol    Types: 2 - 3 Cans of beer, 1 Standard drinks or equivalent per week  . Drug use: No  . Sexual activity: Yes    Partners: Male    Birth control/protection: None    Comment: SS partner  Other Topics Concern  . Not on file  Social History Narrative    Marital status: married x 4 years; happily married.       Lives; with husbnad      Employment: Building control surveyor at [employer]      Tobacco: socially; weekends      Alcohol: 1-3 drinks per week      Drugs: none      Exercise: none   Denies caffeine use    Social Determinants of Health   Financial Resource Strain: Low Risk  (09/12/2022)   Overall Financial Resource Strain (CARDIA)   . Difficulty of Paying Living Expenses: Not very hard  Food Insecurity: No Food Insecurity (09/12/2022)   Hunger Vital Sign   . Worried About Programme researcher, broadcasting/film/video in the Last Year: Never true   . Ran Out of Food in the Last Year: Never true  Transportation Needs: No Transportation Needs (09/12/2022)   PRAPARE - Transportation   . Lack of Transportation (Medical): No   . Lack of Transportation (Non-Medical): No  Physical Activity: Sufficiently Active (09/12/2022)   Exercise Vital Sign   . Days of Exercise per Week: 5 days   . Minutes of Exercise per Session: 60 min  Stress: No Stress Concern Present (09/12/2022)   [employer] of Occupational Health - Occupational Stress Questionnaire   . Feeling of Stress : Not at all  Social Connections: Socially Isolated (09/12/2022)   Social Connection and Isolation Panel [NHANES]   . Frequency of Communication with Friends and Family: Once a week   . Frequency of Social Gatherings with Friends and Family: Once a week   . Attends Religious Services: Never   . Active Member of Clubs or Organizations: No   . Attends Banker Meetings: Not on file   . Marital Status: Separated     Allergies:   Allergies  Allergen Reactions  . Fish-Derived Products Other (See Comments)    Allergic, but reaction not recalled Confirmed thru testing  . Shellfish Allergy Other (See Comments)    Patient does not recall the reaction Confirmed thru testing    Metabolic Disorder Labs: Lab Results  Component Value Date   HGBA1C 6.3 (A) 02/06/2023   MPG 128  07/09/2022   MPG 143 05/23/2021   No results found for: "PROLACTIN" Lab Results  Component Value Date   CHOL 174 07/09/2022   TRIG 116 07/09/2022   HDL 42 07/09/2022   CHOLHDL 4.1 07/09/2022   VLDL 25 11/02/2020   LDLCALC 110 (H) 07/09/2022   LDLCALC 125 (H) 05/23/2021   Lab Results  Component Value Date   TSH 1.06 05/23/2021    Therapeutic Level Labs: No results found for: "LITHIUM" No results found for: "CBMZ" No results found for: "VALPROATE"  Current Medications:  Current Outpatient Medications  Medication Sig Dispense Refill  . albuterol (VENTOLIN HFA) 108 (90 Base) MCG/ACT inhaler Inhale 2 puffs into the lungs every 6 (six) hours as needed for wheezing or shortness of breath. May substitute for preferred albuterol MDI. 6.7 g 2  . citalopram (CELEXA) 10 MG tablet TAKE 1 TABLET(10 MG) BY MOUTH DAILY 90 tablet 0  . DESCOVY 200-25 MG tablet Take 1 tablet by mouth daily. 90 tablet 0  . EPINEPHrine 0.3 mg/0.3 mL IJ SOAJ injection Inject 0.3 mg into the muscle as needed for anaphylaxis. 1 each 1  . Fexofenadine HCl (ALLEGRA PO) Take 1 tablet by mouth daily.    Marland Kitchen ketoconazole (NIZORAL) 2 % shampoo Apply 1 Application topically.    . lamoTRIgine (LAMICTAL) 25 MG tablet TAKE 3 TABLETS BY MOUTH DAILY 90 tablet 2  . mometasone (NASONEX) 50 MCG/ACT nasal spray Place 2 sprays into the nose daily as needed (for allergies).    Marland Kitchen omeprazole (PRILOSEC) 40 MG capsule Take 1 capsule by mouth daily.    . Semaglutide, 1 MG/DOSE, 4 MG/3ML SOPN Inject 1 mg as directed once a week. 9 mL 1   No current facility-administered medications for this visit.     Psychiatric Specialty Exam: Review of Systems  Cardiovascular:  Negative for chest pain.  Skin:  Negative for rash.  Psychiatric/Behavioral:  Negative for agitation, decreased concentration and self-injury.     There were no vitals taken for this visit.There is no height or weight on file to calculate BMI.  General Appearance: Casual   Eye Contact:  Good  Speech:  Clear and Coherent  Volume:  Normal  Mood: fair  Affect:  Congruent  Thought Process:  Goal Directed  Orientation:  Full (Time, Place, and Person)  Thought Content:  Rumination  Suicidal Thoughts:  No  Homicidal Thoughts:  No  Memory:  Immediate;   Fair  Judgement:  Fair  Insight:  Fair  Psychomotor Activity:  Normal  Concentration:  Concentration: Fair  Recall:  Good  Fund of Knowledge:Good  Language: Good  Akathisia:  No  Handed:   AIMS (if indicated):  not done  Assets:  Desire for Improvement Physical Health  ADL's:  Intact  Cognition: WNL  Sleep:  Fair   Screenings: AIMS    Flowsheet Row [facility]  AIMS Total Score 0      AUDIT    Flowsheet Row Office Visit from 09/12/2022 in [facility] Primary Care & Sports Medicine at [facility]  Alcohol Use Disorder Identification Test Final Score (AUDIT) 3 3      GAD-7    Flowsheet Row Office Visit from 02/06/2023 in [facility] Primary Care & Sports Medicine at [facility] Office Visit from 07/09/2022 in [facility] Primary Care & Sports Medicine at [facility] Office Visit from 12/13/2021 in [facility] Care & Sports Medicine at [facility] Office Visit from 03/04/2021 in [facility] Care & [facility] Medicine at [facility] Office Visit from 10/18/2020 in [facility] Primary Care & Sports Medicine at [facility]  Total GAD-7 Score 1 0 4 6 7       PHQ2-9    Flowsheet Row Office Visit from 02/06/2023 in [facility] Primary Care & Sports Medicine at [facility] Office Visit from 07/09/2022 in [facility] Primary Care & Sports Medicine at [facility] Office Visit from 03/17/2022 in  [facility] Primary Care & Sports Medicine at [facility] Office Visit from 12/13/2021 in [facility] Primary Care & Sports Medicine at [facility] Office Visit from 09/10/2021 in [facility] Primary Care & Sports Medicine at [facility] Total Score 1 2 0 2 0  PHQ-9 Total Score 3 6 0 8 --      Flowsheet Row ED from 10/06/2022 in [facility] at [facility] ED from 08/16/2022 in [facility] at [facility] ED from 01/24/2022 in [facility] at [facility])  C-SSRS RISK CATEGORY No Risk No Risk No Risk       Assessment and Plan: as follows  Prior documentation reviewed   Major depressive disorder recurrent moderate to severe: manageable and better continue celexa, lamictal  Generalized anxiety disorder  better continue coping skills and celexa  Call for refills when due  Fu 51m.   Thresa Ross, MD 10/25/202411:30 AM

## 2023-02-25 ENCOUNTER — Telehealth (HOSPITAL_COMMUNITY): Payer: Self-pay

## 2023-02-25 ENCOUNTER — Other Ambulatory Visit (HOSPITAL_COMMUNITY): Payer: Self-pay | Admitting: Psychiatry

## 2023-02-25 DIAGNOSIS — F39 Unspecified mood [affective] disorder: Secondary | ICD-10-CM

## 2023-02-25 MED ORDER — LAMOTRIGINE 25 MG PO TABS: ORAL_TABLET | ORAL | 0 refills | Status: DC

## 2023-02-25 NOTE — Telephone Encounter (Signed)
Medication request - FAx request refill from pt's [facility] for a 90 day order of her prescribed Lamotrigine 25 mg, 3 a day, last ordered for 30 day orders + 2 refills on 11/03/22. Pt. returns next on 07/17/23.

## 2023-03-12 ENCOUNTER — Telehealth: Payer: Self-pay

## 2023-03-12 NOTE — Telephone Encounter (Signed)
Initiated Prior authorization ZOX:[id number] (1 MG/DOSE) 4MG /3ML pen-injectors Via: Covermymeds Case/Key:[id number] Status: approved  as of 03/12/23 Reason:Coverage End Date:03/11/2024 Notified Pt via: Mychart

## 2023-06-21 ENCOUNTER — Other Ambulatory Visit: Payer: Self-pay | Admitting: Family Medicine

## 2023-06-21 ENCOUNTER — Other Ambulatory Visit (HOSPITAL_COMMUNITY): Payer: Self-pay | Admitting: Psychiatry

## 2023-06-21 DIAGNOSIS — Z79899 Other long term (current) drug therapy: Secondary | ICD-10-CM

## 2023-06-21 DIAGNOSIS — F39 Unspecified mood [affective] disorder: Secondary | ICD-10-CM

## 2023-06-26 ENCOUNTER — Ambulatory Visit

## 2023-06-26 ENCOUNTER — Encounter: Payer: Self-pay | Admitting: Sports Medicine

## 2023-06-26 ENCOUNTER — Ambulatory Visit (INDEPENDENT_AMBULATORY_CARE_PROVIDER_SITE_OTHER): Payer: Managed Care, Other (non HMO) | Admitting: Sports Medicine

## 2023-06-26 DIAGNOSIS — M5030 Other cervical disc degeneration, unspecified cervical region: Secondary | ICD-10-CM | POA: Diagnosis not present

## 2023-06-26 DIAGNOSIS — M25511 Pain in right shoulder: Secondary | ICD-10-CM | POA: Insufficient documentation

## 2023-06-26 MED ORDER — MELOXICAM 15 MG PO TABS
ORAL_TABLET | ORAL | 3 refills | Status: AC
Start: 2023-06-26 — End: ?

## 2023-06-26 NOTE — Progress Notes (Signed)
    Procedures performed today:    None.  Independent interpretation of notes and tests performed by another provider:   None.  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 We will start conservatively, meloxicam, x-rays, home PT. Return to see me in 6 weeks, MR for interventional planning if not better.  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 He is having similar symptoms on the right side. Mild, adding x-rays, Mobic, he will cut back his weight by about 25 to 50%. Home conditioning given, return to see me in approximately 6 weeks.    ____________________________________________ Ihor Austin. Benjamin Stain, M.D., ABFM., CAQSM., AME. Primary Care and Sports Medicine [facility]  Adjunct Professor of Family Medicine  [place] [employer]  Restaurant manager, fast food

## 2023-06-26 NOTE — Assessment & Plan Note (Signed)
 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 We will start conservatively, meloxicam, x-rays, home PT. Return to see me in 6 weeks, MR for interventional planning if not better.

## 2023-06-26 NOTE — Assessment & Plan Note (Signed)
 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 He is having similar symptoms on the right side. Mild, adding x-rays, Mobic, he will cut back his weight by about 25 to 50%. Home conditioning given, return to see me in approximately 6 weeks.

## 2023-07-06 ENCOUNTER — Telehealth: Payer: Self-pay

## 2023-07-06 NOTE — Telephone Encounter (Signed)
 Copied from CRM [id number]. Topic: Clinical - Medication Question >> Jul 06, 2023 11:17 AM Nila Nephew wrote: Reason for CRM: Destiny with [facility] is calling to inquire with PCP if they have inquired with patient about starting a statin medication due to patient's history of diabetes. Requesting call back at [phone] - voicemail is secure if needed.

## 2023-07-17 ENCOUNTER — Telehealth (HOSPITAL_COMMUNITY): Payer: 59 | Admitting: Psychiatry

## 2023-07-17 ENCOUNTER — Encounter (HOSPITAL_COMMUNITY): Payer: Self-pay

## 2023-08-06 ENCOUNTER — Ambulatory Visit: Admitting: Family Medicine

## 2023-08-07 ENCOUNTER — Ambulatory Visit: Payer: Managed Care, Other (non HMO) | Admitting: Family Medicine

## 2023-08-12 ENCOUNTER — Ambulatory Visit (INDEPENDENT_AMBULATORY_CARE_PROVIDER_SITE_OTHER): Admitting: Family Medicine

## 2023-08-12 ENCOUNTER — Encounter: Payer: Self-pay | Admitting: Family Medicine

## 2023-08-12 VITALS — BP 130/87 | HR 77 | Ht 70.0 in | Wt 248.0 lb

## 2023-08-12 DIAGNOSIS — E119 Type 2 diabetes mellitus without complications: Secondary | ICD-10-CM | POA: Diagnosis not present

## 2023-08-12 DIAGNOSIS — F332 Major depressive disorder, recurrent severe without psychotic features: Secondary | ICD-10-CM

## 2023-08-12 DIAGNOSIS — Z8639 Personal history of other endocrine, nutritional and metabolic disease: Secondary | ICD-10-CM

## 2023-08-12 DIAGNOSIS — Z23 Encounter for immunization: Secondary | ICD-10-CM | POA: Diagnosis not present

## 2023-08-12 DIAGNOSIS — Z79899 Other long term (current) drug therapy: Secondary | ICD-10-CM

## 2023-08-12 LAB — POCT GLYCOSYLATED HEMOGLOBIN (HGB A1C): HbA1c, POC (controlled diabetic range): 7 % (ref 0.0–7.0)

## 2023-08-12 MED ORDER — DESCOVY 200-25 MG PO TABS: 1.0000 | ORAL_TABLET | Freq: Every day | ORAL | 0 refills | Status: AC

## 2023-08-12 NOTE — Assessment & Plan Note (Signed)
 Blood sugars increased some with A1c of 7.0%.  Ozempic  cost is an issue at this time. Discussed other medications available as generic.  He would prefer to manage with diet alone.

## 2023-08-12 NOTE — Progress Notes (Signed)
 Alexander Osborne [place] - 43 y.o. male MRN [id number]  Date of birth: 06/17/1980  Subjective Chief Complaint  Patient presents with   Diabetes   Hypertension    HPI Alexander Osborne is a 43 y.o. male here today for follow up.   Reports that he is doing ok.  He has not been on ozempic  since January due to insurance change and cost..  He feels that .  A1c today is 7.0%.   He would like to manage with dietary changes for now.   Seeing psychiatry for depression and mood disorder.  Stable with celexa  and lamictal . .    Remains on PrEP with Descovy .  Doing well with this.  Due for updated HIV testing. Normal renal function in October 2024.  ROS:  A comprehensive ROS was completed and negative except as noted per HPI  Allergies  Allergen Reactions   Fish-Derived Products Other (See Comments)    Allergic, but reaction not recalled Confirmed thru testing   Shellfish Allergy Other (See Comments)    Patient does not recall the reaction Confirmed thru testing    Past Medical History:  Diagnosis Date   Allergy    Anxiety    Asthma    Depression    Diabetes mellitus without complication (HCC)     Past Surgical History:  Procedure Laterality Date   corneal transplant left     EYE SURGERY      Social History   Socioeconomic History   Marital status: Legally Separated    Spouse name: Not on file   Number of children: 0   Years of education: Not on file   Highest education level: Associate degree: academic program  Occupational History   Occupation: Event organiser: Radio producer  Tobacco Use   Smoking status: Former    Current packs/day: 0.00    Average packs/day: 0.3 packs/day for 11.0 years (2.8 ttl pk-yrs)    Types: Cigarettes    Start date: 10/04/2006    Quit date: 10/03/2017    Years since quitting: 5.8   Smokeless tobacco: Never  Vaping Use   Vaping status: Some Days  Substance and Sexual Activity   Alcohol use: Yes    Alcohol/week: 3.0 - 4.0 standard  drinks of alcohol    Types: 2 - 3 Cans of beer, 1 Standard drinks or equivalent per week   Drug use: No   Sexual activity: Yes    Partners: Male    Birth control/protection: None    Comment: SS partner  Other Topics Concern   Not on file  Social History Narrative   Marital status: married x 4 years; happily married.       Lives; with husbnad      Employment: Building control surveyor at [employer]      Tobacco: socially; weekends      Alcohol: 1-3 drinks per week      Drugs: none      Exercise: none   Denies caffeine use    Social Drivers of Corporate investment banker Strain: Low Risk  (06/25/2023)   Overall Financial Resource Strain (CARDIA)    Difficulty of Paying Living Expenses: Not very hard  Food Insecurity: No Food Insecurity (06/25/2023)   Hunger Vital Sign    Worried About Running Out of Food in the Last Year: Never true    Ran Out of Food in the Last Year: Never true  Transportation Needs: No Transportation Needs (06/25/2023)   PRAPARE - Transportation  Lack of Transportation (Medical): No    Lack of Transportation (Non-Medical): No  Physical Activity: Sufficiently Active (06/25/2023)   Exercise Vital Sign    Days of Exercise per Week: 7 days    Minutes of Exercise per Session: 40 min  Stress: No Stress Concern Present (06/25/2023)   [employer] of Occupational Health - Occupational Stress Questionnaire    Feeling of Stress : Not at all  Social Connections: Socially Isolated (06/25/2023)   Social Connection and Isolation Panel [NHANES]    Frequency of Communication with Friends and Family: More than three times a week    Frequency of Social Gatherings with Friends and Family: More than three times a week    Attends Religious Services: Never    Database administrator or Organizations: No    Attends Engineer, structural: Not on file    Marital Status: Separated    Family History  Problem Relation Age of Onset   Cancer Mother 20       pancreatic cancer    Hypertension Father    Heart disease Father 54       CAD   Bipolar disorder Brother    Diabetes Sister    Allergic rhinitis Neg Hx    Angioedema Neg Hx    Asthma Neg Hx    Eczema Neg Hx    Immunodeficiency Neg Hx    Urticaria Neg Hx     Health Maintenance  Topic Date Due   Hepatitis C Screening  Never done   Pneumococcal Vaccine 64-62 Years old (2 of 2 - PCV) 05/29/2019   OPHTHALMOLOGY EXAM  01/06/2020   COVID-19 Vaccine (5 - 2024-25 season) 02/22/2024 (Originally 12/21/2022)   INFLUENZA VACCINE  11/20/2023   Diabetic kidney evaluation - eGFR measurement  02/06/2024   Diabetic kidney evaluation - Urine ACR  02/06/2024   FOOT EXAM  02/06/2024   HEMOGLOBIN A1C  02/11/2024   DTaP/Tdap/Td (2 - Td or Tdap) 04/30/2027   HIV Screening  Completed   HPV VACCINES  Aged Out   Meningococcal B Vaccine  Aged Out     ----------------------------------------------------------------------------------------------------------------------------------------------------------------------------------------------------------------- Physical Exam BP 130/87 (BP Location: Left Arm, Patient Position: Sitting, Cuff Size: Large)   Pulse 77   Ht 5\' 10"  (1.778 m)   Wt 248 lb (112.5 kg)   SpO2 96%   BMI 35.58 kg/m   Physical Exam Constitutional:      Appearance: Normal appearance.  Eyes:     General: No scleral icterus. Cardiovascular:     Rate and Rhythm: Normal rate and regular rhythm.  Pulmonary:     Effort: Pulmonary effort is normal.     Breath sounds: Normal breath sounds.  Neurological:     Mental Status: He is alert.  Psychiatric:        Mood and Affect: Mood normal.        Behavior: Behavior normal.     ------------------------------------------------------------------------------------------------------------------------------------------------------------------------------------------------------------------- Assessment and Plan  T2DM (type 2 diabetes mellitus) (HCC) Blood  sugars increased some with A1c of 7.0%.  Ozempic  cost is an issue at this time. Discussed other medications available as generic.  He would prefer to manage with diet alone.   On pre-exposure prophylaxis for HIV Updating HIV antibody.   MDD (major depressive disorder), recurrent severe, without psychosis (HCC) Management per psychiatry.  Stable at this time.   No orders of the defined types were placed in this encounter.   No follow-ups on file.

## 2023-08-12 NOTE — Assessment & Plan Note (Signed)
 Updating HIV antibody.

## 2023-08-12 NOTE — Assessment & Plan Note (Signed)
Management per psychiatry.  Stable at this time. ?

## 2023-08-13 LAB — HIV ANTIBODY (ROUTINE TESTING W REFLEX): HIV Screen 4th Generation wRfx: NONREACTIVE

## 2023-08-14 ENCOUNTER — Ambulatory Visit: Admitting: Sports Medicine

## 2023-08-28 ENCOUNTER — Encounter (HOSPITAL_COMMUNITY): Payer: Self-pay

## 2023-09-02 ENCOUNTER — Other Ambulatory Visit (HOSPITAL_COMMUNITY): Payer: Self-pay | Admitting: Psychiatry

## 2023-09-02 DIAGNOSIS — F39 Unspecified mood [affective] disorder: Secondary | ICD-10-CM

## 2023-09-04 ENCOUNTER — Ambulatory Visit: Admitting: Sports Medicine

## 2023-09-08 ENCOUNTER — Ambulatory Visit: Admitting: Sports Medicine

## 2023-11-09 ENCOUNTER — Encounter (HOSPITAL_COMMUNITY): Payer: Self-pay | Admitting: Psychiatry

## 2023-11-09 ENCOUNTER — Telehealth (INDEPENDENT_AMBULATORY_CARE_PROVIDER_SITE_OTHER): Admitting: Psychiatry

## 2023-11-09 DIAGNOSIS — F411 Generalized anxiety disorder: Secondary | ICD-10-CM | POA: Diagnosis not present

## 2023-11-09 DIAGNOSIS — F331 Major depressive disorder, recurrent, moderate: Secondary | ICD-10-CM | POA: Diagnosis not present

## 2023-11-09 DIAGNOSIS — F39 Unspecified mood [affective] disorder: Secondary | ICD-10-CM

## 2023-11-09 MED ORDER — LAMOTRIGINE 25 MG PO TABS: 75.0000 mg | ORAL_TABLET | Freq: Every day | ORAL | 0 refills | Status: DC

## 2023-11-09 MED ORDER — CITALOPRAM HYDROBROMIDE 10 MG PO TABS: 10.0000 mg | ORAL_TABLET | Freq: Every day | ORAL | 0 refills | Status: DC

## 2023-11-09 NOTE — Progress Notes (Signed)
 [facility] Follow Up Visit  Patient Identification: Alexander Osborne MRN:  [id number] Date of Evaluation:  11/09/2023 Referral Source: hospital discharge Chief Complaint:  follow up  depression, mood symptoms Visit Diagnosis:    ICD-10-CM   1. Unspecified mood (affective) disorder (HCC)  F39 citalopram  (CELEXA ) 10 MG tablet    2. GAD (generalized anxiety disorder)  F41.1     3. MDD (major depressive disorder), recurrent episode, moderate (HCC)  F33.1     Virtual Visit via Video Note  I connected with Alexander Osborne on 11/09/23 at  2:30 PM EDT by a video enabled telemedicine application and verified that I am speaking with the correct person using two identifiers.  Location: Patient: home Provider: home office   I discussed the limitations of evaluation and management by telemedicine and the availability of in person appointments. The patient expressed understanding and agreed to proceed.    I discussed the assessment and treatment plan with the patient. The patient was provided an opportunity to ask questions and all were answered. The patient agreed with the plan and demonstrated an understanding of the instructions.   The patient was advised to call back or seek an in-person evaluation if the symptoms worsen or if the condition fails to improve as anticipated.  I provided 15 minutes of non-face-to-face time during this encounter.        History of Present Illness: Patient is a 43 years old currently single male referred by hospital discharge with OD admission for one day in past leading to referral  He works full-time at [employer]  On eval doing stable tolerating meds, mood is stable No rash on lamictal    Working on self growth    Aggravating factors; past relationships  Modifying factors :support system  Severity improved.  No rash or chest pain    Past Psychiatric History: depression  Previous Psychotropic Medications: Yes   Substance  Abuse History in the last 12 months:  Yes.    Consequences of Substance Abuse: Alcohol use not regular, but discussed its effect on depression  Past Medical History:  Past Medical History:  Diagnosis Date   Allergy    Anxiety    Asthma    Depression    Diabetes mellitus without complication (HCC)     Past Surgical History:  Procedure Laterality Date   corneal transplant left     EYE SURGERY      Family Psychiatric History: brother : bipolar, sister, depression  Family History:  Family History  Problem Relation Age of Onset   Cancer Mother 44       pancreatic cancer   Hypertension Father    Heart disease Father 68       CAD   Bipolar disorder Brother    Diabetes Sister    Allergic rhinitis Neg Hx    Angioedema Neg Hx    Asthma Neg Hx    Eczema Neg Hx    Immunodeficiency Neg Hx    Urticaria Neg Hx     Social History:   Social History   Socioeconomic History   Marital status: Legally Separated    Spouse name: Not on file   Number of children: 0   Years of education: Not on file   Highest education level: Associate degree: academic program  Occupational History   Occupation: Event organiser: Radio producer  Tobacco Use   Smoking status: Former    Current packs/day: 0.00    Average packs/day: 0.3 packs/day  for 11.0 years (2.8 ttl pk-yrs)    Types: Cigarettes    Start date: 10/04/2006    Quit date: 10/03/2017    Years since quitting: 6.1   Smokeless tobacco: Never  Vaping Use   Vaping status: Some Days  Substance and Sexual Activity   Alcohol use: Yes    Alcohol/week: 3.0 - 4.0 standard drinks of alcohol    Types: 2 - 3 Cans of beer, 1 Standard drinks or equivalent per week   Drug use: No   Sexual activity: Yes    Partners: Male    Birth control/protection: None    Comment: SS partner  Other Topics Concern   Not on file  Social History Narrative   Marital status: married x 4 years; happily married.       Lives; with husbnad      Employment:  Building control surveyor at [employer]      Tobacco: socially; weekends      Alcohol: 1-3 drinks per week      Drugs: none      Exercise: none   Denies caffeine use    Social Drivers of Corporate investment banker Strain: Low Risk  (06/25/2023)   Overall Financial Resource Strain (CARDIA)    Difficulty of Paying Living Expenses: Not very hard  Food Insecurity: No Food Insecurity (06/25/2023)   Hunger Vital Sign    Worried About Running Out of Food in the Last Year: Never true    Ran Out of Food in the Last Year: Never true  Transportation Needs: No Transportation Needs (06/25/2023)   PRAPARE - Administrator, Civil Service (Medical): No    Lack of Transportation (Non-Medical): No  Physical Activity: Sufficiently Active (06/25/2023)   Exercise Vital Sign    Days of Exercise per Week: 7 days    Minutes of Exercise per Session: 40 min  Stress: No Stress Concern Present (06/25/2023)   [employer] of Occupational Health - Occupational Stress Questionnaire    Feeling of Stress : Not at all  Social Connections: Socially Isolated (06/25/2023)   Social Connection and Isolation Panel    Frequency of Communication with Friends and Family: More than three times a week    Frequency of Social Gatherings with Friends and Family: More than three times a week    Attends Religious Services: Never    Database administrator or Organizations: No    Attends Engineer, structural: Not on file    Marital Status: Separated     Allergies:   Allergies  Allergen Reactions   Fish-Derived Products Other (See Comments)    Allergic, but reaction not recalled Confirmed thru testing   Shellfish Allergy Other (See Comments)    Patient does not recall the reaction Confirmed thru testing    Metabolic Disorder Labs: Lab Results  Component Value Date   HGBA1C 7.0 08/12/2023   MPG 128 07/09/2022   MPG 143 05/23/2021   No results found for: PROLACTIN Lab Results  Component Value Date    CHOL 174 07/09/2022   TRIG 116 07/09/2022   HDL 42 07/09/2022   CHOLHDL 4.1 07/09/2022   VLDL 25 11/02/2020   LDLCALC 110 (H) 07/09/2022   LDLCALC 125 (H) 05/23/2021   Lab Results  Component Value Date   TSH 1.06 05/23/2021    Therapeutic Level Labs: No results found for: LITHIUM No results found for: CBMZ No results found for: VALPROATE  Current Medications: Current Outpatient Medications  Medication Sig  Dispense Refill   albuterol  (VENTOLIN  HFA) 108 (90 Base) MCG/ACT inhaler Inhale 2 puffs into the lungs every 6 (six) hours as needed for wheezing or shortness of breath. May substitute for preferred albuterol  MDI. 6.7 g 2   citalopram  (CELEXA ) 10 MG tablet Take 1 tablet (10 mg total) by mouth daily. 90 tablet 0   DESCOVY  200-25 MG tablet Take 1 tablet by mouth daily. 90 tablet 0   EPINEPHrine  0.3 mg/0.3 mL IJ SOAJ injection Inject 0.3 mg into the muscle as needed for anaphylaxis. 1 each 1   Fexofenadine HCl (ALLEGRA PO) Take 1 tablet by mouth daily.     ketoconazole (NIZORAL) 2 % shampoo Apply 1 Application topically.     lamoTRIgine  (LAMICTAL ) 25 MG tablet Take 3 tablets (75 mg total) by mouth daily. 270 tablet 0   meloxicam  (MOBIC ) 15 MG tablet One tab PO every 24 hours with a meal for 2 weeks, then once every 24 hours prn pain. 30 tablet 3   mometasone  (NASONEX ) 50 MCG/ACT nasal spray Place 2 sprays into the nose daily as needed (for allergies).     Semaglutide , 1 MG/DOSE, 4 MG/3ML SOPN Inject 1 mg as directed once a week. 9 mL 1   No current facility-administered medications for this visit.     Psychiatric Specialty Exam: Review of Systems  Cardiovascular:  Negative for chest pain.  Skin:  Negative for rash.  Psychiatric/Behavioral:  Negative for agitation, decreased concentration and self-injury.     There were no vitals taken for this visit.There is no height or weight on file to calculate BMI.  General Appearance: Casual  Eye Contact:  Good  Speech:  Clear  and Coherent  Volume:  Normal  Mood: fair  Affect:  Congruent  Thought Process:  Goal Directed  Orientation:  Full (Time, Place, and Person)  Thought Content:  Rumination  Suicidal Thoughts:  No  Homicidal Thoughts:  No  Memory:  Immediate;   Fair  Judgement:  Fair  Insight:  Fair  Psychomotor Activity:  Normal  Concentration:  Concentration: Fair  Recall:  Good  Fund of Knowledge:Good  Language: Good  Akathisia:  No  Handed:   AIMS (if indicated):  not done  Assets:  Desire for Improvement Physical Health  ADL's:  Intact  Cognition: WNL  Sleep:  Fair   Screenings: AIMS    Flowsheet Row [facility]  AIMS Total Score 0   AUDIT    Flowsheet Row Office Visit from 06/26/2023 in [facility] Primary Care & Sports Medicine at [facility] Office Visit from 09/12/2022 in [facility] Primary Care & Sports Medicine at [facility]  Alcohol Use Disorder Identification Test Final Score (AUDIT) 3  3 3    GAD-7    Flowsheet Row Office Visit from 08/12/2023 in [facility] Primary Care & Sports Medicine at [facility] Office Visit from 02/06/2023 in [facility] Primary Care & Sports Medicine at [facility] Office Visit from 07/09/2022 in [facility] Primary Care & Sports Medicine at [facility] Office Visit from 12/13/2021 in [facility] Primary Care & Sports Medicine at [facility] Office Visit from 03/04/2021 in [facility] Primary Care & Sports Medicine at [facility]  Total GAD-7 Score 1 1 0 4 6   PHQ2-9    Flowsheet Row Office Visit from 08/12/2023 in [facility] Primary Care & Sports Medicine at [facility]  [place] Office Visit from 02/06/2023 in [facility] Primary Care & Sports Medicine at [facility] Office Visit from 07/09/2022 in [facility] Primary Care & Sports Medicine at [facility] Office Visit from 03/17/2022 in [facility] Primary Care & Sports Medicine at [facility] Office Visit from 12/13/2021 in [facility] Primary Care & Sports Medicine at [facility] Total Score 2 1 2  0 2  PHQ-9 Total Score 6 3 6  0 8   Flowsheet Row UC from 10/06/2022 in [facility] [place] UC from 08/16/2022 in [facility] at [facility] UC from 01/24/2022 in [facility] at [facility])  C-SSRS RISK CATEGORY No Risk No Risk No Risk    Assessment and Plan: as follows  Prior documentation reviewed   Major depressive disorder recurrent moderate to severe:manageable continue lamictal , celexa    Generalized anxiety disorder managing it better continue celexa    Refills sent, discussed meds, FU 6 m or earlier if needed   Jackey Flight, MD 7/21/20252:42 PM

## 2023-11-30 ENCOUNTER — Other Ambulatory Visit: Payer: Self-pay | Admitting: Family Medicine

## 2023-11-30 DIAGNOSIS — Z79899 Other long term (current) drug therapy: Secondary | ICD-10-CM

## 2023-11-30 NOTE — Telephone Encounter (Signed)
 Hold med refill for completed labs. Sent message to pt via MyChart. Lab ordered.  Front Desk: please contact pt to advise lab has been ordered for med refill. Thanks

## 2023-12-07 NOTE — Telephone Encounter (Signed)
 Please hold until lab order is completed.

## 2023-12-11 ENCOUNTER — Ambulatory Visit: Admitting: Family Medicine

## 2023-12-22 ENCOUNTER — Encounter: Payer: Self-pay | Admitting: Sports Medicine

## 2024-01-20 ENCOUNTER — Other Ambulatory Visit (HOSPITAL_COMMUNITY): Payer: Self-pay | Admitting: Psychiatry

## 2024-01-20 DIAGNOSIS — F39 Unspecified mood [affective] disorder: Secondary | ICD-10-CM

## 2024-02-25 ENCOUNTER — Telehealth: Payer: Self-pay

## 2024-02-25 ENCOUNTER — Other Ambulatory Visit (HOSPITAL_COMMUNITY): Payer: Self-pay

## 2024-02-25 NOTE — Telephone Encounter (Signed)
 Clinical questions answered and PA submitted.

## 2024-02-25 NOTE — Telephone Encounter (Signed)
 Pharmacy Patient Advocate Encounter  Received notification from [facility] that Prior Authorization for Ozempic  4mg /63ml has been APPROVED from 01/26/24 to 02/24/25   PA #/Case ID/Reference #: [id number]

## 2024-02-25 NOTE — Telephone Encounter (Signed)
 Pharmacy Patient Advocate Encounter   Received notification from CoverMyMeds that prior authorization for Ozempic  4mg /4ml is due for renewal.   Insurance verification completed.   The patient is insured through [employer].  Action: PA required; PA started via CoverMyMeds. KEY [id number] . Waiting for clinical questions to populate.

## 2024-03-16 ENCOUNTER — Encounter: Payer: Self-pay | Admitting: Emergency Medicine

## 2024-03-16 ENCOUNTER — Ambulatory Visit
Admission: EM | Admit: 2024-03-16 | Discharge: 2024-03-16 | Disposition: A | Discharge status: Home / Self Care | Attending: Internal Medicine | Admitting: Internal Medicine

## 2024-03-16 DIAGNOSIS — M545 Low back pain, unspecified: Secondary | ICD-10-CM | POA: Diagnosis not present

## 2024-03-16 DIAGNOSIS — J029 Acute pharyngitis, unspecified: Secondary | ICD-10-CM | POA: Diagnosis not present

## 2024-03-16 LAB — POCT RAPID STREP A (OFFICE): Rapid Strep A Screen: NEGATIVE

## 2024-03-16 MED ORDER — IBUPROFEN 800 MG PO TABS: 800.0000 mg | ORAL_TABLET | Freq: Three times a day (TID) | ORAL | 0 refills | Status: AC | PRN

## 2024-03-16 NOTE — Discharge Instructions (Signed)
 Strep testing done today was negative.  Symptoms have been going on for only about 24 hours therefore this is more likely to be a viral type condition.  This does not require antibiotics.  Recommend ibuprofen  800 mg every 8 hours as needed for fever or pain and monitoring symptoms.  You could try salt water gargles or over-the-counter lozenges.  If your symptoms are worsening or fail to improve in 5 days then recommend returning to urgent care.  Low back pain without injury or other concerning symptoms likely secondary to muscle strain.  You can use the ibuprofen  for this as well.  Light stretching to improve symptoms.  Can alternate heat and ice to help improve symptoms.  If no improvement in 5 to 7 days then return to urgent care for further evaluation.

## 2024-03-16 NOTE — ED Triage Notes (Addendum)
 Pt c/o not feeling well, bilateral lower back pain, feeling cold and sore throat for 1 day. States he gets strep often and it feels like this. Denies urinary symptoms

## 2024-03-16 NOTE — ED Provider Notes (Signed)
 [facility]    CSN: [id number] Arrival date & time: 03/16/24  1417      History   Chief Complaint Chief Complaint  Patient presents with   Sore Throat    HPI Alexander Osborne is a 43 y.o. male.   43 year old male presents urgent care with complaints of sore throat and lower back pain.  His symptoms started less than 24 hours ago.  He reports that he generally has not felt well.  He has felt like he has run fevers and chills.  He has not actually taken his temperature.  He was concerned as he is prone to strep throat and this feels similar to when he has had strep throat in the past.  He has not had any nausea, vomiting, dysuria, hematuria, difficulty urinating, diarrhea, cough, congestion.  He reports that he is having some lower back pain associated with this.  He has not done anything particular to injure his back.  The pain is intermittent.  It did get better when he took ibuprofen .  He denies any bowel or bladder incontinence.  The pain does not radiate into his legs.   Sore Throat Pertinent negatives include no chest pain, no abdominal pain and no shortness of breath.    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   MDD (major depressive disorder), recurrent severe, 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 Authorizing Provider  ibuprofen  (ADVIL ) 800 MG tablet Take 1 tablet (800 mg total) by mouth every 8 (eight) hours as needed. 03/16/24  Yes Indiya Izquierdo A, PA-C  albuterol  (VENTOLIN  HFA) 108 (90 Base) MCG/ACT inhaler Inhale 2 puffs into the lungs every 6 (six) hours as needed for wheezing or shortness of breath. May substitute for preferred albuterol  MDI. 02/06/23   Alvia Bring, DO  citalopram  (CELEXA ) 10 MG tablet TAKE 1 TABLET DAILY 01/20/24   Geralene Kaiser, MD  DESCOVY  200-25 MG tablet Take 1 tablet by mouth daily. 08/12/23   Alvia Bring, DO  EPINEPHrine  0.3 mg/0.3 mL IJ SOAJ injection Inject 0.3 mg into the muscle as needed for anaphylaxis. 03/17/22   Alvia Bring, DO  Fexofenadine HCl (ALLEGRA PO) Take 1 tablet by mouth daily.    [provider]  ketoconazole (NIZORAL) 2 % shampoo Apply 1 Application topically. 01/20/23   [provider]  lamoTRIgine  (LAMICTAL )  25 MG tablet TAKE 3 TABLETS DAILY 01/20/24   Akhtar, Nadeem, MD  meloxicam  (MOBIC ) 15 MG tablet One tab PO every 24 hours with a meal for 2 weeks, then once every 24 hours prn pain. 06/26/23   Curtis Debby PARAS, MD  mometasone  (NASONEX ) 50 MCG/ACT nasal spray Place 2 sprays into the nose daily as needed (for allergies).    [provider]  Semaglutide , 1 MG/DOSE, 4 MG/3ML SOPN Inject 1 mg as directed once a week. 02/06/23   Alvia Bring, DO  FLUoxetine  (PROZAC ) 40 MG capsule Take 1 capsule (40 mg total) by mouth daily. 11/02/20 11/26/20  Kendall Cathlyn Collum,  MD  traZODone (DESYREL) 50 MG tablet Take 50-100 mg by mouth at bedtime as needed. 11/21/20 01/23/21  [provider]    Family History Family History  Problem Relation Age of Onset   Cancer Mother 60       pancreatic cancer   Hypertension Father    Heart disease Father 65       CAD   Bipolar disorder Brother    Diabetes Sister    Allergic rhinitis Neg Hx    Angioedema Neg Hx    Asthma Neg Hx    Eczema Neg Hx    Immunodeficiency Neg Hx    Urticaria Neg Hx     Social History Social History   Tobacco Use   Smoking status: Former    Current packs/day: 0.00    Average packs/day: 0.3 packs/day for 11.0 years (2.8 ttl pk-yrs)    Types: Cigarettes    Start date: 10/04/2006    Quit date: 10/03/2017    Years since quitting: 6.4   Smokeless tobacco: Never  Vaping Use   Vaping status: Some Days  Substance Use Topics   Alcohol use: Yes    Alcohol/week: 3.0 - 4.0 standard drinks of alcohol    Types: 2 - 3 Cans of beer, 1 Standard drinks or equivalent per week   Drug use: No     Allergies   Fish protein-containing drug products and Shellfish allergy   Review of Systems Review of Systems  Constitutional:  Positive for chills and fever.  HENT:  Positive for sore throat. Negative for ear pain.   Eyes:  Negative for pain and visual disturbance.  Respiratory:  Negative for cough and shortness of breath.   Cardiovascular:  Negative for chest pain and palpitations.  Gastrointestinal:  Negative for abdominal pain and vomiting.  Genitourinary:  Negative for dysuria and hematuria.  Musculoskeletal:  Positive for back pain. Negative for arthralgias.  Skin:  Negative for color change and rash.  Neurological:  Negative for seizures and syncope.  All other systems reviewed and are negative.    Physical Exam Triage Vital Signs ED Triage Vitals  Encounter Vitals Group     BP 03/16/24 1422 137/87     Girls Systolic BP Percentile --      Girls Diastolic BP Percentile --       Boys Systolic BP Percentile --      Boys Diastolic BP Percentile --      Pulse Rate 03/16/24 1422 76     Resp --      Temp 03/16/24 1422 98.2 F (36.8 C)     Temp Source 03/16/24 1422 Oral     SpO2 03/16/24 1422 96 %     Weight --      Height --      Head Circumference --      Peak  Flow --      Pain Score 03/16/24 1425 4     Pain Loc --      Pain Education --      Exclude from Growth Chart --    No data found.  Updated Vital Signs BP 137/87 (BP Location: Right Arm)   Pulse 76   Temp 98.2 F (36.8 C) (Oral)   SpO2 96%   Visual Acuity Right Eye Distance:   Left Eye Distance:   Bilateral Distance:    Right Eye Near:   Left Eye Near:    Bilateral Near:     Physical Exam Vitals and nursing note reviewed.  Constitutional:      General: He is not in acute distress.    Appearance: He is well-developed.  HENT:     Head: Normocephalic and atraumatic.     Right Ear: Tympanic membrane normal.     Left Ear: Tympanic membrane normal.     Nose: No congestion.     Mouth/Throat:     Mouth: Mucous membranes are moist.     Pharynx: Uvula midline. Posterior oropharyngeal erythema present. No pharyngeal swelling, oropharyngeal exudate or uvula swelling.     Tonsils: No tonsillar exudate or tonsillar abscesses. 2+ on the right. 2+ on the left.  Eyes:     Conjunctiva/sclera: Conjunctivae normal.  Cardiovascular:     Rate and Rhythm: Normal rate and regular rhythm.     Heart sounds: No murmur heard. Pulmonary:     Effort: Pulmonary effort is normal. No respiratory distress.     Breath sounds: Normal breath sounds.  Abdominal:     Palpations: Abdomen is soft.     Tenderness: There is no abdominal tenderness.  Musculoskeletal:        General: No swelling.     Cervical back: Neck supple.       Back:  Skin:    General: Skin is warm and dry.     Capillary Refill: Capillary refill takes less than 2 seconds.  Neurological:     Mental Status: He is alert.  Psychiatric:         Mood and Affect: Mood normal.      UC Treatments / Results  Labs (all labs ordered are listed, but only abnormal results are displayed) Labs Reviewed  POCT RAPID STREP A (OFFICE) - Normal    EKG   Radiology No results found.  Procedures Procedures (including critical care time)  Medications Ordered in UC Medications - No data to display  Initial Impression / Assessment and Plan / UC Course  I have reviewed the triage vital signs and the nursing notes.  Pertinent labs & imaging results that were available during my care of the patient were reviewed by me and considered in my medical decision making (see chart for details).     Sore throat  Acute bilateral low back pain without sciatica  Viral pharyngitis  Strep testing done today was negative.  Symptoms have been going on for only about 24 hours therefore this is more likely to be a viral type condition.  This does not require antibiotics.  Recommend ibuprofen  800 mg every 8 hours as needed for fever or pain and monitoring symptoms.  You could try salt water gargles or over-the-counter lozenges.  If your symptoms are worsening or fail to improve in 5 days then recommend returning to urgent care.  Low back pain without injury or other concerning symptoms likely secondary to muscle strain.  You can use  the ibuprofen  for this as well.  Light stretching to improve symptoms.  Can alternate heat and ice to help improve symptoms.  If no improvement in 5 to 7 days then return to urgent care for further evaluation.  Final Clinical Impressions(s) / UC Diagnoses   Final diagnoses:  Sore throat  Acute bilateral low back pain without sciatica  Viral pharyngitis     Discharge Instructions      Strep testing done today was negative.  Symptoms have been going on for only about 24 hours therefore this is more likely to be a viral type condition.  This does not require antibiotics.  Recommend ibuprofen  800 mg every 8 hours as needed  for fever or pain and monitoring symptoms.  You could try salt water gargles or over-the-counter lozenges.  If your symptoms are worsening or fail to improve in 5 days then recommend returning to urgent care.  Low back pain without injury or other concerning symptoms likely secondary to muscle strain.  You can use the ibuprofen  for this as well.  Light stretching to improve symptoms.  Can alternate heat and ice to help improve symptoms.  If no improvement in 5 to 7 days then return to urgent care for further evaluation.    ED Prescriptions     Medication Sig Dispense Auth. Provider   ibuprofen  (ADVIL ) 800 MG tablet Take 1 tablet (800 mg total) by mouth every 8 (eight) hours as needed. 21 tablet Teresa Almarie LABOR, PA-C      PDMP not reviewed this encounter.   Teresa Almarie LABOR, PA-C 03/16/24 1503

## 2024-03-17 ENCOUNTER — Encounter (HOSPITAL_COMMUNITY): Payer: Self-pay

## 2024-03-17 ENCOUNTER — Emergency Department (HOSPITAL_COMMUNITY): Admission: EM | Admit: 2024-03-17 | Discharge: 2024-03-17 | Disposition: A | Discharge status: Home / Self Care

## 2024-03-17 DIAGNOSIS — R000 Tachycardia, unspecified: Secondary | ICD-10-CM | POA: Diagnosis not present

## 2024-03-17 DIAGNOSIS — J45909 Unspecified asthma, uncomplicated: Secondary | ICD-10-CM | POA: Insufficient documentation

## 2024-03-17 DIAGNOSIS — J020 Streptococcal pharyngitis: Secondary | ICD-10-CM | POA: Insufficient documentation

## 2024-03-17 DIAGNOSIS — J029 Acute pharyngitis, unspecified: Secondary | ICD-10-CM | POA: Diagnosis present

## 2024-03-17 DIAGNOSIS — R7401 Elevation of levels of liver transaminase levels: Secondary | ICD-10-CM | POA: Insufficient documentation

## 2024-03-17 DIAGNOSIS — E1165 Type 2 diabetes mellitus with hyperglycemia: Secondary | ICD-10-CM | POA: Diagnosis not present

## 2024-03-17 DIAGNOSIS — D72829 Elevated white blood cell count, unspecified: Secondary | ICD-10-CM | POA: Diagnosis not present

## 2024-03-17 LAB — COMPREHENSIVE METABOLIC PANEL WITH GFR
ALT: 51 U/L — ABNORMAL HIGH (ref 0–44)
AST: 32 U/L (ref 15–41)
Albumin: 4.5 g/dL (ref 3.5–5.0)
Alkaline Phosphatase: 67 U/L (ref 38–126)
Anion gap: 10 (ref 5–15)
BUN: 11 mg/dL (ref 6–20)
CO2: 25 mmol/L (ref 22–32)
Calcium: 9.2 mg/dL (ref 8.9–10.3)
Chloride: 99 mmol/L (ref 98–111)
Creatinine, Ser: 0.9 mg/dL (ref 0.61–1.24)
GFR, Estimated: >60 mL/min (ref >60)
Glucose, Bld: 177 mg/dL — ABNORMAL HIGH (ref 70–99)
Potassium: 4 mmol/L (ref 3.5–5.1)
Sodium: 134 mmol/L — ABNORMAL LOW (ref 135–145)
Total Bilirubin: 0.9 mg/dL (ref 0.0–1.2)
Total Protein: 8 g/dL (ref 6.5–8.1)

## 2024-03-17 LAB — CBC WITH DIFFERENTIAL/PLATELET
Abs Immature Granulocytes: 0.05 K/uL (ref 0.00–0.07)
Basophils Absolute: 0 K/uL (ref 0.0–0.1)
Basophils Relative: 0 %
Eosinophils Absolute: 0 K/uL (ref 0.0–0.5)
Eosinophils Relative: 0 %
HCT: 40.6 % (ref 39.0–52.0)
Hemoglobin: 14.4 g/dL (ref 13.0–17.0)
Immature Granulocytes: 0 %
Lymphocytes Relative: 11 %
Lymphs Abs: 1.4 K/uL (ref 0.7–4.0)
MCH: 30.7 pg (ref 26.0–34.0)
MCHC: 35.5 g/dL (ref 30.0–36.0)
MCV: 86.6 fL (ref 80.0–100.0)
Monocytes Absolute: 0.9 K/uL (ref 0.1–1.0)
Monocytes Relative: 7 %
Neutro Abs: 10.4 K/uL — ABNORMAL HIGH (ref 1.7–7.7)
Neutrophils Relative %: 82 %
Platelets: 191 K/uL (ref 150–400)
RBC: 4.69 MIL/uL (ref 4.22–5.81)
RDW: 12.9 % (ref 11.5–15.5)
WBC: 12.8 K/uL — ABNORMAL HIGH (ref 4.0–10.5)
nRBC: 0 % (ref 0.0–0.2)

## 2024-03-17 LAB — GROUP A STREP BY PCR: Group A Strep by PCR: DETECTED — AB

## 2024-03-17 LAB — I-STAT CG4 LACTIC ACID, ED: Lactic Acid, Venous: 0.9 mmol/L (ref 0.5–1.9)

## 2024-03-17 LAB — RESP PANEL BY RT-PCR (RSV, FLU A&B, COVID)  RVPGX2
Influenza A by PCR: NEGATIVE
Influenza B by PCR: NEGATIVE
Resp Syncytial Virus by PCR: NEGATIVE
SARS Coronavirus 2 by RT PCR: NEGATIVE

## 2024-03-17 MED ORDER — SODIUM CHLORIDE 0.9 % IV BOLUS
1000.0000 mL | Freq: Once | INTRAVENOUS | Status: AC
  Administered 2024-03-17: 1000 mL via INTRAVENOUS

## 2024-03-17 MED ORDER — AMOXICILLIN 500 MG PO CAPS
500.0000 mg | ORAL_CAPSULE | Freq: Once | ORAL | Status: AC
  Administered 2024-03-17: 500 mg via ORAL
  Filled 2024-03-17: qty 1

## 2024-03-17 MED ORDER — AMOXICILLIN 500 MG PO CAPS: 500.0000 mg | ORAL_CAPSULE | Freq: Two times a day (BID) | ORAL | 0 refills | Status: AC

## 2024-03-17 MED ORDER — KETOROLAC TROMETHAMINE 15 MG/ML IJ SOLN
15.0000 mg | Freq: Once | INTRAMUSCULAR | Status: AC
  Administered 2024-03-17: 15 mg via INTRAVENOUS
  Filled 2024-03-17: qty 1

## 2024-03-17 MED ORDER — DEXAMETHASONE SOD PHOSPHATE PF 10 MG/ML IJ SOLN
10.0000 mg | Freq: Once | INTRAMUSCULAR | Status: AC
  Administered 2024-03-17: 10 mg via INTRAVENOUS

## 2024-03-17 MED ORDER — AMOXICILLIN 500 MG PO CAPS: 500.0000 mg | ORAL_CAPSULE | Freq: Two times a day (BID) | ORAL | 0 refills | Status: DC

## 2024-03-17 NOTE — ED Provider Notes (Signed)
 [place] EMERGENCY DEPARTMENT AT [facility] Provider Note   CSN: [id number] Arrival date & time: 03/17/24  9050     Patient presents with: Sore Throat and Back Pain   Alexander Osborne is a 43 y.o. male with a past medical history significant for asthma, diabetes, depression, and anxiety who presents to the ED due to sore throat, low back pain, and fever that started yesterday.  Patient admits to difficulties swallowing secondary to pain.  Denies shortness of breath.  No changes to phonation or trismus.  Notes he developed a fever last night Tmax 103 F.  Also admits to some low back pain.  Did have some radiation down right lower extremity yesterday which has completely resolved.  Denies saddle anesthesia, bowel/or incontinence, lower extremity numbness/tingling, lower extremity weakness.  No IV drug use.  Denies any urinary symptoms.  No history of low back pain. Patient was seen at urgent care yesterday where his rapid strep was negative.  Notes he gets strep throat frequently.  No sick contacts.  Denies neck stiffness.  Denies headache.  History obtained from patient and past medical records. No interpreter used during encounter.       Prior to Admission medications   Medication Sig Start Date End Date Taking? Authorizing Provider  albuterol  (VENTOLIN  HFA) 108 (90 Base) MCG/ACT inhaler Inhale 2 puffs into the lungs every 6 (six) hours as needed for wheezing or shortness of breath. May substitute for preferred albuterol  MDI. 02/06/23   Alvia Bring, DO  amoxicillin  (AMOXIL ) 500 MG capsule Take 1 capsule (500 mg total) by mouth 2 (two) times daily for 10 days. 03/17/24 03/27/24  Harrel Ferrone C, PA-C  citalopram  (CELEXA ) 10 MG tablet TAKE 1 TABLET DAILY 01/20/24   Geralene Kaiser, MD  DESCOVY  200-25 MG tablet Take 1 tablet by mouth daily. 08/12/23   Alvia Bring, DO  EPINEPHrine  0.3 mg/0.3 mL IJ SOAJ injection Inject 0.3 mg into the muscle as needed for anaphylaxis.  03/17/22   Alvia Bring, DO  Fexofenadine HCl (ALLEGRA PO) Take 1 tablet by mouth daily.    [provider]  ibuprofen  (ADVIL ) 800 MG tablet Take 1 tablet (800 mg total) by mouth every 8 (eight) hours as needed. 03/16/24   White, Elizabeth A, PA-C  ketoconazole (NIZORAL) 2 % shampoo Apply 1 Application topically. 01/20/23   [provider]  lamoTRIgine  (LAMICTAL ) 25 MG tablet TAKE 3 TABLETS DAILY 01/20/24   Geralene Kaiser, MD  meloxicam  (MOBIC ) 15 MG tablet One tab PO every 24 hours with a meal for 2 weeks, then once every 24 hours prn pain. 06/26/23   Curtis Debby PARAS, MD  mometasone  (NASONEX ) 50 MCG/ACT nasal spray Place 2 sprays into the nose daily as needed (for allergies).    [provider]  Semaglutide , 1 MG/DOSE, 4 MG/3ML SOPN Inject 1 mg as directed once a week. 02/06/23   Alvia Bring, DO  FLUoxetine  (PROZAC ) 40 MG capsule Take 1 capsule (40 mg total) by mouth daily. 11/02/20 11/26/20  Kendall Cathlyn Collum, MD  traZODone (DESYREL) 50 MG tablet Take 50-100 mg by mouth at bedtime as needed. 11/21/20 01/23/21  [provider]    Allergies: Fish protein-containing drug products and Shellfish allergy    Review of Systems  Constitutional:  Positive for fever.  HENT:  Positive for sore throat.   Respiratory:  Negative for shortness of breath.   Cardiovascular:  Negative for chest pain.  Gastrointestinal:  Negative for abdominal pain.  Genitourinary:  Negative  for dysuria.  Musculoskeletal:  Positive for back pain.    Updated Vital Signs BP (!) 160/103 (BP Location: Right Arm)   Pulse (!) 116   Temp 99.8 F (37.7 C) (Oral)   Resp 18   Ht 5' 10 (1.778 m)   Wt 111.1 kg   SpO2 97%   BMI 35.15 kg/m   Physical Exam Vitals and nursing note reviewed.  Constitutional:      General: He is not in acute distress.    Appearance: He is not ill-appearing.  HENT:     Head: Normocephalic.     Mouth/Throat:     Comments: Posterior oropharynx clear and  mucous membranes moist, there is mild erythema, 1+ tonsillar hypertrophy, no exudates, uvula midline, normal phonation, no trismus, tolerating secretions without difficulty. Eyes:     Pupils: Pupils are equal, round, and reactive to light.  Cardiovascular:     Rate and Rhythm: Regular rhythm. Tachycardia present.     Pulses: Normal pulses.     Heart sounds: Normal heart sounds. No murmur heard.    No friction rub. No gallop.  Pulmonary:     Effort: Pulmonary effort is normal.     Breath sounds: Normal breath sounds.  Abdominal:     General: Abdomen is flat. There is no distension.     Palpations: Abdomen is soft.     Tenderness: There is no abdominal tenderness. There is no guarding or rebound.     Comments: Abdomen soft, nondistended, nontender to palpation in all quadrants without guarding or peritoneal signs. No rebound.   Musculoskeletal:        General: Normal range of motion.     Cervical back: Neck supple.     Comments: No thoracic or lumbar midline tenderness. Moves all 4 extremities without difficulty.  Skin:    General: Skin is warm and dry.  Neurological:     General: No focal deficit present.     Mental Status: He is alert.  Psychiatric:        Mood and Affect: Mood normal.        Behavior: Behavior normal.     (all labs ordered are listed, but only abnormal results are displayed) Labs Reviewed  GROUP A STREP BY PCR - Abnormal; Notable for the following components:      Result Value   Group A Strep by PCR DETECTED (*)    All other components within normal limits  CBC WITH DIFFERENTIAL/PLATELET - Abnormal; Notable for the following components:   WBC 12.8 (*)    Neutro Abs 10.4 (*)    All other components within normal limits  COMPREHENSIVE METABOLIC PANEL WITH GFR - Abnormal; Notable for the following components:   Sodium 134 (*)    Glucose, Bld 177 (*)    ALT 51 (*)    All other components within normal limits  RESP PANEL BY RT-PCR (RSV, FLU A&B, COVID)   [id number]  I-STAT CG4 LACTIC ACID, ED    EKG: None  Radiology: No results found.   Procedures   Medications Ordered in the ED  amoxicillin  (AMOXIL ) capsule 500 mg (has no administration in time range)  sodium chloride  0.9 % bolus 1,000 mL (1,000 mLs Intravenous New Bag/Given 03/17/24 1042)  ketorolac  (TORADOL ) 15 MG/ML injection 15 mg (15 mg Intravenous Given 03/17/24 1041)  dexamethasone  (DECADRON ) injection 10 mg (10 mg Intravenous Given 03/17/24 1041)    Clinical Course as of 03/17/24 1119  Thu Mar 17, 2024  1014 Pulse Rate(!):  116 [CA]  1014 Temp: 99.8 F (37.7 C) [CA]  1112 Group A Strep by PCR(!): DETECTED [CA]    Clinical Course User Index [CA] Lorelle Aleck BROCKS, PA-C                                 Medical Decision Making Amount and/or Complexity of Data Reviewed External Data Reviewed: notes.    Details: UC note Labs: ordered. Decision-making details documented in ED Course.  Risk Prescription drug management.   This patient presents to the ED for concern of sore throat, fever, low back pain, this involves an extensive number of treatment options, and is a complaint that carries with it a high risk of complications and morbidity.  The differential diagnosis includes strep throat, viral process, osteomyelitis/discitis, sepsis, etc  43 year old male presents to the ED due to sore throat, fever, and low back pain that started yesterday.  Seen at urgent care yesterday where rapid strep was negative.  Denies saddle anesthesia, bowel/bladder incontinence, lower extremity numbness/tingling, lower extremity weakness.  No IV drug use.  Upon arrival patient borderline febrile at 99.8 F and tachycardic at 116.  Patient well-appearing on exam.  Does have 1+ tonsillar hypertrophy.  Uvula midline.  No exudates.  No abscess.  Tolerating oral secretions without difficulty.  Lungs clear to auscultation bilaterally.  No thoracic or lumbar midline tenderness.  Able to move all 4  extremities without difficulty. RVP and strep ordered. Routine labs. Lower suspicion for osteomyelitis/discitis. Feel symptoms are likely a viral process with myalgias, will hold on imaging pending lab results.  CBC with slight leukocytosis of 12.8.  Normal lactic acid.  CMP with hyperglycemia 117.  Normal anion gap.  Low suspicion for DKA.  Normal renal function.  Mild elevation in ALT at 51.  Strep positive which is likely causing patient's symptoms.  Will treat with amoxicillin . No abscess on exam. Able to tolerate po without difficulty. Low suspicion for discitis or other infectious etiologies of low back pain.  RVP negative. HR improved after IVFs. Patient stable for discharge. Strict ED precautions discussed with patient. Patient states understanding and agrees to plan. Patient discharged home in no acute distress and stable vitals  Has PCP Lives independently Hx asthma/DM    Final diagnoses:  Strep throat    ED Discharge Orders          Ordered    amoxicillin  (AMOXIL ) 500 MG capsule  2 times daily,   Status:  Discontinued        03/17/24 1118    amoxicillin  (AMOXIL ) 500 MG capsule  2 times daily        03/17/24 1119               Arie Powell C, PA-C 03/17/24 1135    Kammerer, Duwaine L, DO 03/18/24 [phone]

## 2024-03-17 NOTE — ED Triage Notes (Addendum)
 Pt c/o sore throat and fever x1 day and low back pain x2 days.  Pain score 4/10.  Pt was seen at [facility] yesterday and strep was negative.  Pt sts I think, I might have been swabbed to soon.  Denies injury and urinary complaints.  Pt has not taken anything today for symptoms.

## 2024-03-17 NOTE — Discharge Instructions (Signed)
 It was a pleasure taking care of you today. As discussed your strep test is positive which is likely causing your symptoms. I am sending you home with an antibiotic. Take as prescribed and finish all antibiotics. You may take over the counter ibuprofen  or tylenol  as needed for pain/fever. Return to the ER for new or worsening symptoms.

## 2024-05-09 ENCOUNTER — Telehealth (HOSPITAL_COMMUNITY): Admitting: Psychiatry

## 2024-05-09 ENCOUNTER — Encounter (HOSPITAL_COMMUNITY): Payer: Self-pay | Admitting: Psychiatry

## 2024-05-09 DIAGNOSIS — F39 Unspecified mood [affective] disorder: Secondary | ICD-10-CM

## 2024-05-09 DIAGNOSIS — F411 Generalized anxiety disorder: Secondary | ICD-10-CM

## 2024-05-09 DIAGNOSIS — F331 Major depressive disorder, recurrent, moderate: Secondary | ICD-10-CM

## 2024-05-09 DIAGNOSIS — F332 Major depressive disorder, recurrent severe without psychotic features: Secondary | ICD-10-CM

## 2024-05-09 DIAGNOSIS — Z87828 Personal history of other (healed) physical injury and trauma: Secondary | ICD-10-CM

## 2024-05-09 MED ORDER — CITALOPRAM HYDROBROMIDE 10 MG PO TABS: 10.0000 mg | ORAL_TABLET | Freq: Every day | ORAL | 0 refills | Status: AC

## 2024-05-09 MED ORDER — LAMOTRIGINE 25 MG PO TABS: 75.0000 mg | ORAL_TABLET | Freq: Every day | ORAL | 0 refills | Status: AC

## 2024-05-09 NOTE — Progress Notes (Signed)
 " [facility] Follow Up Visit  Patient Identification: Alexander Osborne MRN:  [id number] Date of Evaluation:  05/09/2024 Referral Source: hospital discharge Chief Complaint:  follow up  depression, mood symptoms Visit Diagnosis:    ICD-10-CM   1. Unspecified mood (affective) disorder  F39 citalopram  (CELEXA ) 10 MG tablet    2. GAD (generalized anxiety disorder)  F41.1     3. MDD (major depressive disorder), recurrent episode, moderate (HCC)  F33.1     4. History of trauma  Z87.828     Virtual Visit via Video Note  I connected with Alexander Osborne on 05/09/24 at  3:30 PM EST by a video enabled telemedicine application and verified that I am speaking with the correct person using two identifiers.  Location: Patient: home Provider: home office   I discussed the limitations of evaluation and management by telemedicine and the availability of in person appointments. The patient expressed understanding and agreed to proceed.    I discussed the assessment and treatment plan with the patient. The patient was provided an opportunity to ask questions and all were answered. The patient agreed with the plan and demonstrated an understanding of the instructions.   The patient was advised to call back or seek an in-person evaluation if the symptoms worsen or if the condition fails to improve as anticipated.  I provided 16 minutes of non-face-to-face time during this encounter.      History of Present Illness: Patient is a 44 years old currently single male referred by hospital discharge with OD admission for one day in past leading to referral  He works full-time at [employer] On evaluation doing stable tolerating medication no rash reported on Lamictal  Focus on hearing now and better relationships currently    Aggravating factors; past relationships  Modifying factors :support system  Severity improved.  No rash or chest pain    Past Psychiatric History:  depression  Previous Psychotropic Medications: Yes   Substance Abuse History in the last 12 months:  Yes.    Consequences of Substance Abuse: Alcohol use not regular, but discussed its effect on depression  Past Medical History:  Past Medical History:  Diagnosis Date   Allergy    Anxiety    Asthma    Depression    Diabetes mellitus without complication (HCC)     Past Surgical History:  Procedure Laterality Date   corneal transplant left     EYE SURGERY      Family Psychiatric History: brother : bipolar, sister, depression  Family History:  Family History  Problem Relation Age of Onset   Cancer Mother 66       pancreatic cancer   Hypertension Father    Heart disease Father 71       CAD   Bipolar disorder Brother    Diabetes Sister    Allergic rhinitis Neg Hx    Angioedema Neg Hx    Asthma Neg Hx    Eczema Neg Hx    Immunodeficiency Neg Hx    Urticaria Neg Hx     Social History:   Social History   Socioeconomic History   Marital status: Legally Separated    Spouse name: Not on file   Number of children: 0   Years of education: Not on file   Highest education level: Associate degree: academic program  Occupational History   Occupation: Event Organiser: Radio Producer  Tobacco Use   Smoking status: Former    Current packs/day: 0.00  Average packs/day: 0.3 packs/day for 11.0 years (2.8 ttl pk-yrs)    Types: Cigarettes    Start date: 10/04/2006    Quit date: 10/03/2017    Years since quitting: 6.6   Smokeless tobacco: Never  Vaping Use   Vaping status: Some Days  Substance and Sexual Activity   Alcohol use: Yes    Alcohol/week: 3.0 - 4.0 standard drinks of alcohol    Types: 2 - 3 Cans of beer, 1 Standard drinks or equivalent per week   Drug use: No   Sexual activity: Yes    Partners: Male    Birth control/protection: None    Comment: SS partner  Other Topics Concern   Not on file  Social History Narrative   Marital status: married x 4  years; happily married.       Lives; with husbnad      Employment: building control surveyor at [employer]      Tobacco: socially; weekends      Alcohol: 1-3 drinks per week      Drugs: none      Exercise: none   Denies caffeine use    Social Drivers of Health   Tobacco Use: Medium Risk (05/09/2024)   Patient History    Smoking Tobacco Use: Former    Smokeless Tobacco Use: Never    Passive Exposure: Not on file  Financial Resource Strain: Low Risk (06/25/2023)   Overall Financial Resource Strain (CARDIA)    Difficulty of Paying Living Expenses: Not very hard  Food Insecurity: No Food Insecurity (06/25/2023)   Hunger Vital Sign    Worried About Running Out of Food in the Last Year: Never true    Ran Out of Food in the Last Year: Never true  Transportation Needs: No Transportation Needs (06/25/2023)   PRAPARE - Administrator, Civil Service (Medical): No    Lack of Transportation (Non-Medical): No  Physical Activity: Sufficiently Active (06/25/2023)   Exercise Vital Sign    Days of Exercise per Week: 7 days    Minutes of Exercise per Session: 40 min  Stress: No Stress Concern Present (06/25/2023)   [employer] of Occupational Health - Occupational Stress Questionnaire    Feeling of Stress : Not at all  Social Connections: Socially Isolated (06/25/2023)   Social Connection and Isolation Panel    Frequency of Communication with Friends and Family: More than three times a week    Frequency of Social Gatherings with Friends and Family: More than three times a week    Attends Religious Services: Never    Database Administrator or Organizations: No    Attends Engineer, Structural: Not on file    Marital Status: Separated  Depression (PHQ2-9): Medium Risk (08/12/2023)   Depression (PHQ2-9)    PHQ-2 Score: 6  Alcohol Screen: Low Risk (06/25/2023)   Alcohol Screen    Last Alcohol Screening Score (AUDIT): 3  Housing: High Risk (06/25/2023)   Housing Stability Vital Sign     Unable to Pay for Housing in the Last Year: Yes    Number of Times Moved in the Last Year: 0    Homeless in the Last Year: No  Utilities: Not on file  Health Literacy: Adequate Health Literacy (08/12/2023)   B1300 Health Literacy    Frequency of need for help with medical instructions: Never     Allergies:   Allergies  Allergen Reactions   Fish Protein-Containing Drug Products Other (See Comments)    Allergic,  but reaction not recalled Confirmed thru testing   Shellfish Allergy Other (See Comments)    Patient does not recall the reaction Confirmed thru testing    Metabolic Disorder Labs: Lab Results  Component Value Date   HGBA1C 7.0 08/12/2023   MPG 128 07/09/2022   MPG 143 05/23/2021   No results found for: PROLACTIN Lab Results  Component Value Date   CHOL 174 07/09/2022   TRIG 116 07/09/2022   HDL 42 07/09/2022   CHOLHDL 4.1 07/09/2022   VLDL 25 11/02/2020   LDLCALC 110 (H) 07/09/2022   LDLCALC 125 (H) 05/23/2021   Lab Results  Component Value Date   TSH 1.06 05/23/2021    Therapeutic Level Labs: No results found for: LITHIUM No results found for: CBMZ No results found for: VALPROATE  Current Medications: Current Outpatient Medications  Medication Sig Dispense Refill   albuterol  (VENTOLIN  HFA) 108 (90 Base) MCG/ACT inhaler Inhale 2 puffs into the lungs every 6 (six) hours as needed for wheezing or shortness of breath. May substitute for preferred albuterol  MDI. 6.7 g 2   citalopram  (CELEXA ) 10 MG tablet Take 1 tablet (10 mg total) by mouth daily. 90 tablet 0   DESCOVY  200-25 MG tablet Take 1 tablet by mouth daily. 90 tablet 0   EPINEPHrine  0.3 mg/0.3 mL IJ SOAJ injection Inject 0.3 mg into the muscle as needed for anaphylaxis. 1 each 1   Fexofenadine HCl (ALLEGRA PO) Take 1 tablet by mouth daily.     ibuprofen  (ADVIL ) 800 MG tablet Take 1 tablet (800 mg total) by mouth every 8 (eight) hours as needed. 21 tablet 0   ketoconazole (NIZORAL) 2 %  shampoo Apply 1 Application topically.     lamoTRIgine  (LAMICTAL ) 25 MG tablet Take 3 tablets (75 mg total) by mouth daily. 270 tablet 0   meloxicam  (MOBIC ) 15 MG tablet One tab PO every 24 hours with a meal for 2 weeks, then once every 24 hours prn pain. 30 tablet 3   mometasone  (NASONEX ) 50 MCG/ACT nasal spray Place 2 sprays into the nose daily as needed (for allergies).     Semaglutide , 1 MG/DOSE, 4 MG/3ML SOPN Inject 1 mg as directed once a week. 9 mL 1   No current facility-administered medications for this visit.     Psychiatric Specialty Exam: Review of Systems  Cardiovascular:  Negative for chest pain.  Skin:  Negative for rash.  Psychiatric/Behavioral:  Negative for agitation, decreased concentration and self-injury.     There were no vitals taken for this visit.There is no height or weight on file to calculate BMI.  General Appearance: Casual  Eye Contact:  Good  Speech:  Clear and Coherent  Volume:  Normal  Mood: fair  Affect:  Congruent  Thought Process:  Goal Directed  Orientation:  Full (Time, Place, and Person)  Thought Content:  Rumination  Suicidal Thoughts:  No  Homicidal Thoughts:  No  Memory:  Immediate;   Fair  Judgement:  Fair  Insight:  Fair  Psychomotor Activity:  Normal  Concentration:  Concentration: Fair  Recall:  Good  Fund of Knowledge:Good  Language: Good  Akathisia:  No  Handed:   AIMS (if indicated):  not done  Assets:  Desire for Improvement Physical Health  ADL's:  Intact  Cognition: WNL  Sleep:  Fair   Screenings: AIMS    Flowsheet Row [facility]  AIMS Total Score 0   AUDIT  Flowsheet Row Office Visit from 06/26/2023 in [facility] at [facility] Office Visit from 09/12/2022 in [facility] & [facility] Medicine at [facility]  Alcohol Use Disorder Identification Test Final Score (AUDIT) 3  3 3    GAD-7    Flowsheet Row Office Visit from 08/12/2023 in [facility] Primary Care & Sports Medicine at [facility] Office Visit from 02/06/2023 in [facility] Care & [facility] Medicine at [facility] Office Visit from 07[facility]09/2022 in [facility] Primary Care & Sports Medicine at [facility] Office Visit from 12/13/2021 in [facility] Primary Care & Sports Medicine at [facility] Office Visit from 03/04/2021 in [facility] Primary Care & Sports Medicine at [facility]  Total GAD-7 Score 1 1 0 4 6   PHQ2-9    Flowsheet Row Office Visit from 08/12/2023 in [facility] Primary Care & Sports Medicine at [facility] Office Visit from 02/06/2023 in [facility] Primary Care & Sports Medicine at [facility] Office Visit from 07/09/2022 in [facility] Office Visit from 03/17/2022 in [facility] Primary Care & Sports Medicine at [facility] Office Visit from 12/13/2021 in [facility] Primary Care & Sports Medicine at [facility]  PHQ-2 Total Score 2 1 2  0 2  PHQ-9 Total Score 6 3 6  0 8   Flowsheet Row Video Visit from 05/09/2024 in [facility] at [facility] ED from 03/17/2024 in [facility] Emergency Department at [facility] UC from 03/16/2024 in [facility])  C-SSRS RISK CATEGORY No Risk No Risk No Risk    Assessment and Plan: as follows Prior documentation reviewed   Major depressive disorder recurrent moderate to severe: Stable continue Lamictal  and citalopram    Generalized anxiety disorder; managing it better continue citalopram  and coping skills  Refills sent, fu 9 m or earlier if needed   Jackey Flight, MD 1/19/20263:45 PM   "

## 2025-02-06 ENCOUNTER — Telehealth (HOSPITAL_COMMUNITY): Admitting: Psychiatry
